# Patient Record
Sex: Male | Born: 1955 | Race: White | Hispanic: No | Smoking: Current every day smoker
Health system: Southern US, Community
[De-identification: ages and names within clinical notes are randomized; demographics above are authoritative.]

## PROBLEM LIST (undated history)

## (undated) DIAGNOSIS — I714 Abdominal aortic aneurysm, without rupture, unspecified: Secondary | ICD-10-CM

## (undated) DIAGNOSIS — K219 Gastro-esophageal reflux disease without esophagitis: Secondary | ICD-10-CM

## (undated) DIAGNOSIS — C099 Malignant neoplasm of tonsil, unspecified: Secondary | ICD-10-CM

## (undated) DIAGNOSIS — J449 Chronic obstructive pulmonary disease, unspecified: Secondary | ICD-10-CM

## (undated) DIAGNOSIS — I10 Essential (primary) hypertension: Secondary | ICD-10-CM

## (undated) DIAGNOSIS — J45909 Unspecified asthma, uncomplicated: Secondary | ICD-10-CM

## (undated) DIAGNOSIS — I619 Nontraumatic intracerebral hemorrhage, unspecified: Secondary | ICD-10-CM

## (undated) DIAGNOSIS — M1990 Unspecified osteoarthritis, unspecified site: Secondary | ICD-10-CM

## (undated) DIAGNOSIS — K5792 Diverticulitis of intestine, part unspecified, without perforation or abscess without bleeding: Secondary | ICD-10-CM

## (undated) DIAGNOSIS — I2510 Atherosclerotic heart disease of native coronary artery without angina pectoris: Secondary | ICD-10-CM

## (undated) DIAGNOSIS — I700 Atherosclerosis of aorta: Secondary | ICD-10-CM

## (undated) HISTORY — DX: Nontraumatic intracerebral hemorrhage, unspecified: I61.9

## (undated) HISTORY — PX: APPENDECTOMY: SHX54

## (undated) HISTORY — PX: HERNIA REPAIR: SHX51

## (undated) HISTORY — PX: FRACTURE SURGERY: SHX138

## (undated) HISTORY — DX: Chronic obstructive pulmonary disease, unspecified: J44.9

## (undated) HISTORY — DX: Malignant neoplasm of tonsil, unspecified: C09.9

---

## 2006-09-15 ENCOUNTER — Ambulatory Visit: Payer: Self-pay | Admitting: Internal Medicine

## 2012-10-12 ENCOUNTER — Ambulatory Visit: Payer: Self-pay | Admitting: Family Medicine

## 2012-11-09 ENCOUNTER — Emergency Department: Payer: Self-pay | Admitting: Unknown Physician Specialty

## 2012-11-09 LAB — CBC
MCV: 100 fL (ref 80–100)
Platelet: 250 10*3/uL (ref 150–440)
RBC: 4.45 10*6/uL (ref 4.40–5.90)
RDW: 13.1 % (ref 11.5–14.5)

## 2012-11-09 LAB — COMPREHENSIVE METABOLIC PANEL
Alkaline Phosphatase: 82 U/L (ref 50–136)
Anion Gap: 8 (ref 7–16)
BUN: 16 mg/dL (ref 7–18)
Bilirubin,Total: 0.5 mg/dL (ref 0.2–1.0)
Calcium, Total: 9.1 mg/dL (ref 8.5–10.1)
Co2: 25 mmol/L (ref 21–32)
EGFR (African American): >60
EGFR (Non-African Amer.): >60
SGOT(AST): 72 U/L — ABNORMAL HIGH (ref 15–37)
SGPT (ALT): 115 U/L — ABNORMAL HIGH (ref 12–78)

## 2012-11-09 LAB — URINALYSIS, COMPLETE
Bilirubin,UR: NEGATIVE
Blood: NEGATIVE
Leukocyte Esterase: NEGATIVE
Nitrite: NEGATIVE
Ph: 5 (ref 4.5–8.0)
Protein: NEGATIVE
RBC,UR: 1 /HPF (ref 0–5)
Specific Gravity: 1.023 (ref 1.003–1.030)
Squamous Epithelial: NONE SEEN
WBC UR: <1 /HPF (ref 0–5)

## 2012-11-10 LAB — DRUG SCREEN, URINE
Barbiturates, Ur Screen: NEGATIVE (ref ?–200)
Benzodiazepine, Ur Scrn: NEGATIVE (ref ?–200)
Cocaine Metabolite,Ur ~~LOC~~: NEGATIVE (ref ?–300)
MDMA (Ecstasy)Ur Screen: NEGATIVE (ref ?–500)
Methadone, Ur Screen: NEGATIVE (ref ?–300)
Phencyclidine (PCP) Ur S: NEGATIVE (ref ?–25)
Tricyclic, Ur Screen: NEGATIVE (ref ?–1000)

## 2013-01-11 ENCOUNTER — Ambulatory Visit: Payer: Self-pay | Admitting: Family Medicine

## 2013-06-21 ENCOUNTER — Ambulatory Visit: Payer: Self-pay

## 2014-01-11 ENCOUNTER — Emergency Department: Payer: Self-pay | Admitting: Emergency Medicine

## 2015-09-25 ENCOUNTER — Other Ambulatory Visit: Payer: Self-pay | Admitting: Internal Medicine

## 2015-09-25 DIAGNOSIS — R1084 Generalized abdominal pain: Secondary | ICD-10-CM

## 2015-09-27 ENCOUNTER — Ambulatory Visit
Admission: RE | Admit: 2015-09-27 | Discharge: 2015-09-27 | Disposition: A | Discharge status: Home / Self Care | Payer: 59 | Source: Ambulatory Visit | Attending: Internal Medicine | Admitting: Internal Medicine

## 2015-09-27 ENCOUNTER — Other Ambulatory Visit: Payer: Self-pay | Admitting: Internal Medicine

## 2015-09-27 DIAGNOSIS — N200 Calculus of kidney: Secondary | ICD-10-CM | POA: Diagnosis not present

## 2015-09-27 DIAGNOSIS — R0602 Shortness of breath: Secondary | ICD-10-CM | POA: Insufficient documentation

## 2015-09-27 DIAGNOSIS — J449 Chronic obstructive pulmonary disease, unspecified: Secondary | ICD-10-CM

## 2015-09-27 DIAGNOSIS — R1084 Generalized abdominal pain: Secondary | ICD-10-CM

## 2015-11-13 ENCOUNTER — Other Ambulatory Visit: Payer: Self-pay | Admitting: Internal Medicine

## 2015-11-13 DIAGNOSIS — F17200 Nicotine dependence, unspecified, uncomplicated: Secondary | ICD-10-CM

## 2015-12-03 ENCOUNTER — Ambulatory Visit: Payer: 59

## 2015-12-03 ENCOUNTER — Telehealth: Payer: Self-pay | Admitting: *Deleted

## 2015-12-03 NOTE — Telephone Encounter (Signed)
Received referral for low dose lung cancer screening CT scan. Voicemail left at phone number listed in EMR for patient to call me back to facilitate scheduling scan.  

## 2015-12-10 ENCOUNTER — Telehealth: Payer: Self-pay | Admitting: *Deleted

## 2015-12-10 NOTE — Telephone Encounter (Signed)
Received referral for initial lung cancer screening scan. Contacted patient and obtained smoking history as well as answering questions related to screening process. Patient denies signs of lung cancer such as weight loss or hemoptysis. Patient denies comorbidity that would prevent curative treatment if lung cancer were found. Patient is tentatively scheduled for shared decision making visit and CT scan on 12/14/15 at 2pm, pending insurance approval from business office.

## 2015-12-10 NOTE — Telephone Encounter (Signed)
Received referral for low dose lung cancer screening CT scan. Voicemail left at phone number listed in EMR for patient to call me back to facilitate scheduling scan.  

## 2015-12-13 ENCOUNTER — Other Ambulatory Visit: Payer: Self-pay | Admitting: Family Medicine

## 2015-12-14 ENCOUNTER — Ambulatory Visit
Admission: RE | Admit: 2015-12-14 | Discharge: 2015-12-14 | Disposition: A | Discharge status: Home / Self Care | Payer: 59 | Source: Ambulatory Visit | Attending: Internal Medicine | Admitting: Internal Medicine

## 2015-12-14 ENCOUNTER — Inpatient Hospital Stay: Payer: 59 | Attending: Family Medicine | Admitting: Family Medicine

## 2015-12-14 ENCOUNTER — Encounter: Payer: Self-pay | Admitting: Family Medicine

## 2015-12-14 DIAGNOSIS — J439 Emphysema, unspecified: Secondary | ICD-10-CM | POA: Diagnosis not present

## 2015-12-14 DIAGNOSIS — Z122 Encounter for screening for malignant neoplasm of respiratory organs: Secondary | ICD-10-CM | POA: Insufficient documentation

## 2015-12-14 DIAGNOSIS — Z87891 Personal history of nicotine dependence: Secondary | ICD-10-CM | POA: Diagnosis not present

## 2015-12-14 DIAGNOSIS — I2510 Atherosclerotic heart disease of native coronary artery without angina pectoris: Secondary | ICD-10-CM | POA: Insufficient documentation

## 2015-12-14 DIAGNOSIS — I700 Atherosclerosis of aorta: Secondary | ICD-10-CM | POA: Diagnosis not present

## 2015-12-14 DIAGNOSIS — F17210 Nicotine dependence, cigarettes, uncomplicated: Secondary | ICD-10-CM | POA: Insufficient documentation

## 2015-12-14 DIAGNOSIS — F17200 Nicotine dependence, unspecified, uncomplicated: Secondary | ICD-10-CM

## 2015-12-14 DIAGNOSIS — M47894 Other spondylosis, thoracic region: Secondary | ICD-10-CM | POA: Insufficient documentation

## 2015-12-14 NOTE — Progress Notes (Signed)
In accordance with CMS guidelines, patient has meet eligibility criteria including age, absence of signs or symptoms of lung cancer, the specific calculation of cigarette smoking pack-years was 45 years and is a current smoker.   A shared decision-making session was conducted prior to the performance of CT scan. This includes one or more decision aids, includes benefits and harms of screening, follow-up diagnostic testing, over-diagnosis, false positive rate, and total radiation exposure.  Counseling on the importance of adherence to annual lung cancer LDCT screening, impact of co-morbidities, and ability or willingness to undergo diagnosis and treatment is imperative for compliance of the program.  Counseling on the importance of continued smoking cessation for former smokers; the importance of smoking cessation for current smokers and information about tobacco cessation interventions have been given to patient including the [place] at [facility], 1800 quit [place], as well as [place] specific smoking cessation programs.  Written order for lung cancer screening with LDCT has been given to the patient and any and all questions have been answered to the best of my abilities.   Yearly follow up will be scheduled by Burgess Estelle, Thoracic Navigator.

## 2015-12-17 ENCOUNTER — Telehealth: Payer: Self-pay | Admitting: *Deleted

## 2015-12-17 NOTE — Telephone Encounter (Signed)
Notified patient of LDCT lung cancer screening results with recommendation for 12 month follow up imaging. Also notified of incidental finding noted below. Patient verbalizes understanding.   IMPRESSION: 1. Lung-RADS Category 2, benign appearance or behavior. Continue annual screening with low-dose chest CT without contrast in 12 months 2. Emphysema 3. Aortic atherosclerosis and coronary artery calcification. 4. Thoracic spondylosis and kyphosis

## 2016-10-28 ENCOUNTER — Encounter: Payer: Self-pay | Admitting: Emergency Medicine

## 2016-10-28 DIAGNOSIS — I10 Essential (primary) hypertension: Secondary | ICD-10-CM | POA: Insufficient documentation

## 2016-10-28 DIAGNOSIS — J45909 Unspecified asthma, uncomplicated: Secondary | ICD-10-CM | POA: Insufficient documentation

## 2016-10-28 DIAGNOSIS — K5732 Diverticulitis of large intestine without perforation or abscess without bleeding: Secondary | ICD-10-CM | POA: Insufficient documentation

## 2016-10-28 DIAGNOSIS — F17210 Nicotine dependence, cigarettes, uncomplicated: Secondary | ICD-10-CM | POA: Insufficient documentation

## 2016-10-28 DIAGNOSIS — K852 Alcohol induced acute pancreatitis without necrosis or infection: Secondary | ICD-10-CM | POA: Insufficient documentation

## 2016-10-28 DIAGNOSIS — R1013 Epigastric pain: Secondary | ICD-10-CM | POA: Diagnosis present

## 2016-10-28 LAB — CBC
HEMATOCRIT: 45.4 % (ref 40.0–52.0)
HEMOGLOBIN: 16 g/dL (ref 13.0–18.0)
MCH: 35.1 pg — ABNORMAL HIGH (ref 26.0–34.0)
MCHC: 35.3 g/dL (ref 32.0–36.0)
MCV: 99.3 fL (ref 80.0–100.0)
Platelets: 351 10*3/uL (ref 150–440)
RBC: 4.58 MIL/uL (ref 4.40–5.90)
RDW: 13.1 % (ref 11.5–14.5)
WBC: 15.6 10*3/uL — AB (ref 3.8–10.6)

## 2016-10-28 LAB — COMPREHENSIVE METABOLIC PANEL
ALBUMIN: 4.3 g/dL (ref 3.5–5.0)
ALT: 92 U/L — ABNORMAL HIGH (ref 17–63)
ANION GAP: 8 (ref 5–15)
AST: 69 U/L — AB (ref 15–41)
Alkaline Phosphatase: 91 U/L (ref 38–126)
BILIRUBIN TOTAL: 0.7 mg/dL (ref 0.3–1.2)
BUN: 17 mg/dL (ref 6–20)
CHLORIDE: 101 mmol/L (ref 101–111)
CO2: 28 mmol/L (ref 22–32)
Calcium: 9.6 mg/dL (ref 8.9–10.3)
Creatinine, Ser: 0.99 mg/dL (ref 0.61–1.24)
GFR calc Af Amer: >60 mL/min (ref >60)
GFR calc non Af Amer: >60 mL/min (ref >60)
GLUCOSE: 180 mg/dL — AB (ref 65–99)
POTASSIUM: 3.9 mmol/L (ref 3.5–5.1)
Sodium: 137 mmol/L (ref 135–145)
TOTAL PROTEIN: 7.9 g/dL (ref 6.5–8.1)

## 2016-10-28 LAB — URINALYSIS, COMPLETE (UACMP) WITH MICROSCOPIC
BACTERIA UA: NONE SEEN
BILIRUBIN URINE: NEGATIVE
Glucose, UA: NEGATIVE mg/dL
HGB URINE DIPSTICK: NEGATIVE
KETONES UR: NEGATIVE mg/dL
LEUKOCYTES UA: NEGATIVE
NITRITE: NEGATIVE
PROTEIN: NEGATIVE mg/dL
SQUAMOUS EPITHELIAL / LPF: NONE SEEN
Specific Gravity, Urine: 1.021 (ref 1.005–1.030)
pH: 5 (ref 5.0–8.0)

## 2016-10-28 LAB — ETHANOL

## 2016-10-28 LAB — TROPONIN I

## 2016-10-28 LAB — LIPASE, BLOOD: LIPASE: 66 U/L — AB (ref 11–51)

## 2016-10-28 NOTE — ED Triage Notes (Signed)
Patient ambulatory to triage with steady gait, without difficulty or distress noted; pt reports mid abd pain x 3 days accomp by N/V; st hx hernia

## 2016-10-29 ENCOUNTER — Emergency Department: Payer: 59

## 2016-10-29 ENCOUNTER — Emergency Department
Admission: EM | Admit: 2016-10-29 | Discharge: 2016-10-29 | Disposition: A | Discharge status: Home / Self Care | Payer: 59 | Attending: Emergency Medicine | Admitting: Emergency Medicine

## 2016-10-29 DIAGNOSIS — K5732 Diverticulitis of large intestine without perforation or abscess without bleeding: Secondary | ICD-10-CM

## 2016-10-29 DIAGNOSIS — K852 Alcohol induced acute pancreatitis without necrosis or infection: Secondary | ICD-10-CM

## 2016-10-29 HISTORY — DX: Essential (primary) hypertension: I10

## 2016-10-29 HISTORY — DX: Unspecified asthma, uncomplicated: J45.909

## 2016-10-29 MED ORDER — CIPROFLOXACIN HCL 500 MG PO TABS: 500.0000 mg | ORAL_TABLET | Freq: Two times a day (BID) | ORAL | 0 refills | Status: AC

## 2016-10-29 MED ORDER — CIPROFLOXACIN HCL 500 MG PO TABS
500.0000 mg | ORAL_TABLET | Freq: Once | ORAL | Status: AC
  Administered 2016-10-29: 500 mg via ORAL
  Filled 2016-10-29: qty 1

## 2016-10-29 MED ORDER — ONDANSETRON HCL 4 MG/2ML IJ SOLN
4.0000 mg | Freq: Once | INTRAMUSCULAR | Status: AC
  Administered 2016-10-29: 4 mg via INTRAVENOUS
  Filled 2016-10-29: qty 2

## 2016-10-29 MED ORDER — MORPHINE SULFATE (PF) 4 MG/ML IV SOLN
4.0000 mg | Freq: Once | INTRAVENOUS | Status: AC
  Administered 2016-10-29: 4 mg via INTRAVENOUS
  Filled 2016-10-29: qty 1

## 2016-10-29 MED ORDER — MORPHINE SULFATE (PF) 4 MG/ML IV SOLN
INTRAVENOUS | Status: AC
  Filled 2016-10-29: qty 1

## 2016-10-29 MED ORDER — IOPAMIDOL (ISOVUE-300) INJECTION 61%
30.0000 mL | Freq: Once | INTRAVENOUS | Status: AC | PRN
  Administered 2016-10-29: 30 mL via ORAL

## 2016-10-29 MED ORDER — OXYCODONE-ACETAMINOPHEN 5-325 MG PO TABS: 1.0000 | ORAL_TABLET | ORAL | 0 refills | Status: DC | PRN

## 2016-10-29 MED ORDER — METRONIDAZOLE 500 MG PO TABS
500.0000 mg | ORAL_TABLET | Freq: Once | ORAL | Status: AC
  Administered 2016-10-29: 500 mg via ORAL
  Filled 2016-10-29: qty 1

## 2016-10-29 MED ORDER — MORPHINE SULFATE (PF) 4 MG/ML IV SOLN
8.0000 mg | Freq: Once | INTRAVENOUS | Status: AC
  Administered 2016-10-29: 8 mg via INTRAVENOUS

## 2016-10-29 MED ORDER — METRONIDAZOLE 500 MG PO TABS: 500.0000 mg | ORAL_TABLET | Freq: Two times a day (BID) | ORAL | 0 refills | Status: AC

## 2016-10-29 MED ORDER — SODIUM CHLORIDE 0.9 % IV BOLUS (SEPSIS)
1000.0000 mL | Freq: Once | INTRAVENOUS | Status: AC
  Administered 2016-10-29: 1000 mL via INTRAVENOUS

## 2016-10-29 MED ORDER — IOPAMIDOL (ISOVUE-300) INJECTION 61%
100.0000 mL | Freq: Once | INTRAVENOUS | Status: AC | PRN
Start: 2016-10-29 — End: 2016-10-29
  Administered 2016-10-29: 100 mL via INTRAVENOUS

## 2016-10-29 NOTE — ED Notes (Signed)
Pt finished drinking contrast. CT made aware.

## 2016-10-29 NOTE — ED Provider Notes (Signed)
[facility] Emergency Department Provider Note   First MD Initiated Contact with Patient 10/29/16 0101     (approximate)  I have reviewed the triage vital signs and the nursing notes.   HISTORY  Chief Complaint Abdominal Pain   HPI Ronnie Ingram is a 61 y.o. male with history of midline abdominal hernia presents to the emergency department with mid abdominal pain 3 days associated with nausea and vomiting. Patient denies any fever or febrile on presentation her temperature 97.8. Patient denies any urinary symptoms. Patient states last bowel movement 3 days ago. Patient does admit to EtOH ingestion with last occurrence yesterday. Patient states his current pain score is 9 out of 10.  Past medical history Midline abdominal hernia  There are no active problems to display for this patient.   Past surgical history None  Prior to Admission medications   Medication Sig Start Date End Date Taking? Authorizing Provider  ciprofloxacin (CIPRO) 500 MG tablet Take 1 tablet (500 mg total) by mouth 2 (two) times daily. 10/29/16 11/12/16  Gregor Hams, MD  metroNIDAZOLE (FLAGYL) 500 MG tablet Take 1 tablet (500 mg total) by mouth 2 (two) times daily. 10/29/16 11/12/16  Gregor Hams, MD  oxyCODONE-acetaminophen (ROXICET) 5-325 MG tablet Take 1 tablet by mouth every 4 (four) hours as needed for severe pain. 10/29/16   Gregor Hams, MD    Allergies No known drug allergies No family history on file.  Social History Social History  Substance Use Topics  . Smoking status: Current Every Day Smoker    Packs/day: 1.00    Years: 30.00    Types: Cigarettes  . Smokeless tobacco: Never Used  . Alcohol use 7.2 oz/week    12 Cans of beer per week    Review of Systems Constitutional: No fever/chills Eyes: No visual changes. ENT: No sore throat. Cardiovascular: Denies chest pain. Respiratory: Denies shortness of breath. Gastrointestinal: No abdominal pain.  No  nausea, no vomiting.  No diarrhea.  No constipation. Genitourinary: Negative for dysuria. Musculoskeletal: Negative for back pain. Integumentary: Negative for rash. Neurological: Negative for headaches, focal weakness or numbness.   ____________________________________________   PHYSICAL EXAM:  VITAL SIGNS: ED Triage Vitals  Enc Vitals Group     BP 10/28/16 2115 (!) 154/87     Pulse Rate 10/28/16 2115 83     Resp 10/28/16 2115 18     Temp 10/28/16 2115 97.8 F (36.6 C)     Temp Source 10/28/16 2115 Oral     SpO2 10/28/16 2115 97 %     Weight 10/28/16 2112 185 lb (83.9 kg)     Height 10/28/16 2112 5\' 10"  (1.778 m)     Head Circumference --      Peak Flow --      Pain Score 10/28/16 2112 9     Pain Loc --      Pain Edu? --      Excl. in [place]? --     Constitutional: Alert and oriented. Apparent discomfort  Eyes: Conjunctivae are normal. PERRL. EOMI. Head: Atraumatic.Marland Kitchen Mouth/Throat: Mucous membranes are moist.  Oropharynx non-erythematous. Neck: No stridor.   Cardiovascular: Normal rate, regular rhythm. Good peripheral circulation. Grossly normal heart sounds. Respiratory: Normal respiratory effort.  No retractions. Lungs CTAB. Gastrointestinal: Epigastric and left lower quadrant abdominal pain No distention.   Musculoskeletal: No lower extremity tenderness nor edema. No gross deformities of extremities. Neurologic:  Normal speech and language. No gross focal neurologic deficits are  appreciated.  Skin:  Skin is warm, dry and intact. No rash noted. Psychiatric: Mood and affect are normal. Speech and behavior are normal.  ____________________________________________   LABS (all labs ordered are listed, but only abnormal results are displayed)  Labs Reviewed  LIPASE, BLOOD - Abnormal; Notable for the following:       Result Value   Lipase 66 (*)    All other components within normal limits  COMPREHENSIVE METABOLIC PANEL - Abnormal; Notable for the following:    Glucose,  Bld 180 (*)    AST 69 (*)    ALT 92 (*)    All other components within normal limits  CBC - Abnormal; Notable for the following:    WBC 15.6 (*)    MCH 35.1 (*)    All other components within normal limits  URINALYSIS, COMPLETE (UACMP) WITH MICROSCOPIC - Abnormal; Notable for the following:    Color, Urine YELLOW (*)    APPearance CLEAR (*)    All other components within normal limits  TROPONIN I  ETHANOL   ____________________________________________  EKG  ED ECG REPORT I, [place] N BROWN, the attending physician, personally viewed and interpreted this ECG.   Date: 10/29/2016  EKG Time: 9:18 PM  Rate: 83  Rhythm: Normal sinus rhythm  Axis: Normal  Intervals: Normal  ST&T Change: None  ____________________________________________  RADIOLOGY I, [place] N BROWN, personally viewed and evaluated these images (plain radiographs) as part of my medical decision making, as well as reviewing the written report by the radiologist. Dg Abdomen 1 View  Result Date: 10/29/2016 CLINICAL DATA:  61 y/o M; acute abdominal pain. Concern for perforation. EXAM: ABDOMEN - 1 VIEW COMPARISON:  10/29/2016 CT of the abdomen and pelvis. FINDINGS: Normal bowel gas pattern. Retained oral contrast within the small bowel and excreted intravenous contrast in the renal collecting system. No pneumoperitoneum identified. Mild degenerative changes of the lumbar spine. IMPRESSION: No pneumoperitoneum identified. Electronically Signed   By: Kristine Garbe M.D.   On: 10/29/2016 03:13   Ct Abdomen Pelvis W Contrast  Result Date: 10/29/2016 CLINICAL DATA:  Abdominal pain for 3 days with nausea and vomiting. Left lower quadrant and epigastric pain. White cell count 15.6. EXAM: CT ABDOMEN AND PELVIS WITH CONTRAST TECHNIQUE: Multidetector CT imaging of the abdomen and pelvis was performed using the standard protocol following bolus administration of intravenous contrast. CONTRAST:  151mL ISOVUE-300 IOPAMIDOL  (ISOVUE-300) INJECTION 61% COMPARISON:  None. FINDINGS: Lower chest: The lung bases are clear. Hepatobiliary: No focal liver abnormality is seen. No gallstones, gallbladder wall thickening, or biliary dilatation. Pancreas: Unremarkable. No pancreatic ductal dilatation or surrounding inflammatory changes. Spleen: Normal in size without focal abnormality. Adrenals/Urinary Tract: Adrenal glands are unremarkable. Kidneys are normal, without renal calculi, focal lesion, or hydronephrosis. Bladder is unremarkable. Stomach/Bowel: Stomach, small bowel, and colon are not abnormally distended. No wall thickening or inflammatory changes identified. Diverticulosis of the sigmoid colon. Focal wall thickening demonstrated at the rectosigmoid junction with mild infiltration in the surrounding fat may indicate early diverticulitis. Follow-up after resolution of acute process is recommended to exclude underlying neoplasm. No abscess. Appendix is surgically absent. Vascular/Lymphatic: Calcific aortic atherosclerosis. Lower abdominal aortic dilatation with maximal diameter of 3.2 cm. No dissection. No significant lymphadenopathy. Reproductive: Prostate calcifications. Prostate gland is not enlarged. Other: No free air or free fluid in the abdomen. Abdominal wall musculature appears intact. Musculoskeletal: Degenerative changes in the spine and hips. No destructive bone lesions. IMPRESSION: Diverticulosis of the sigmoid colon with minimal infiltration  at the rectosigmoid junction suggesting mild diverticulitis. No abscess. Wall thickening of the sigmoid colon at the rectosigmoid junction is likely inflammatory but follow-up after resolution of acute process is recommended to exclude underlying colon neoplasm. Electronically Signed   By: Lucienne Capers M.D.   On: 10/29/2016 02:43     Procedures   ____________________________________________   INITIAL IMPRESSION / ASSESSMENT AND PLAN / ED COURSE  Pertinent labs & imaging  results that were available during my care of the patient were reviewed by me and considered in my medical decision making (see chart for details).  61 year old male presenting with epigastric and left lower quadrant abdominal pain associated with vomiting. History of physical exam concerning for possible pancreatitis and diverticulitis which are confirmed by laboratory data which revealed an elevated lipase. CT scan revealed diverticulitis. Patient given Cipro and Flagyl in the emergency department will be prescribed same for home.  While in the emergency room the patient received IV morphine. In addition the patient was given ciprofloxacin and Flagyl. Patient advised not to drink alcohol as this will exacerbate his pancreatitis. Patient is advised to return to emergency department if worsening pain vomiting or fever.    ____________________________________________  FINAL CLINICAL IMPRESSION(S) / ED DIAGNOSES  Final diagnoses:  Alcohol-induced acute pancreatitis without infection or necrosis  Diverticulitis of large intestine without perforation or abscess without bleeding     MEDICATIONS GIVEN DURING THIS VISIT:  Medications  morphine 4 MG/ML injection (not administered)  morphine 4 MG/ML injection 4 mg (4 mg Intravenous Given 10/29/16 0116)  ondansetron (ZOFRAN) injection 4 mg (4 mg Intravenous Given 10/29/16 0116)  iopamidol (ISOVUE-300) 61 % injection 30 mL (30 mLs Oral Contrast Given 10/29/16 0134)  iopamidol (ISOVUE-300) 61 % injection 100 mL (100 mLs Intravenous Contrast Given 10/29/16 0222)  morphine 4 MG/ML injection 8 mg (8 mg Intravenous Given 10/29/16 0258)  sodium chloride 0.9 % bolus 1,000 mL (0 mLs Intravenous Stopped 10/29/16 0350)     NEW OUTPATIENT MEDICATIONS STARTED DURING THIS VISIT:  New Prescriptions   CIPROFLOXACIN (CIPRO) 500 MG TABLET    Take 1 tablet (500 mg total) by mouth 2 (two) times daily.   METRONIDAZOLE (FLAGYL) 500 MG TABLET    Take 1 tablet (500 mg total)  by mouth 2 (two) times daily.   OXYCODONE-ACETAMINOPHEN (ROXICET) 5-325 MG TABLET    Take 1 tablet by mouth every 4 (four) hours as needed for severe pain.    Modified Medications   No medications on file    Discontinued Medications   No medications on file     Note:  This document was prepared using Dragon voice recognition software and may include unintentional dictation errors.    Gregor Hams, MD 10/29/16 [phone]

## 2016-12-09 ENCOUNTER — Telehealth: Payer: Self-pay | Admitting: *Deleted

## 2016-12-09 NOTE — Telephone Encounter (Signed)
Attempted to leave message for patient to notify them that it is time to schedule annual low dose lung cancer screening CT scan. However, there is no message option on number in emr. Will attempt to contact at later date.

## 2017-01-08 ENCOUNTER — Encounter: Payer: Self-pay | Admitting: *Deleted

## 2017-11-24 ENCOUNTER — Other Ambulatory Visit: Payer: Self-pay | Admitting: Physician Assistant

## 2017-11-24 DIAGNOSIS — J449 Chronic obstructive pulmonary disease, unspecified: Secondary | ICD-10-CM

## 2017-11-26 ENCOUNTER — Telehealth: Payer: Self-pay | Admitting: *Deleted

## 2017-11-26 NOTE — Telephone Encounter (Signed)
Received referral for initial lung cancer screening scan. Noted patient was previously active in the lung screening program but had failed to have the recommended annual lung screening scan. Discussed importance of this screening with patient who requested to have his wife call me to discuss further. Verified that he had my call back number and that he would have his wife call me.

## 2017-11-30 ENCOUNTER — Other Ambulatory Visit: Payer: Self-pay

## 2017-11-30 DIAGNOSIS — Z1211 Encounter for screening for malignant neoplasm of colon: Secondary | ICD-10-CM

## 2017-12-01 ENCOUNTER — Ambulatory Visit: Payer: Self-pay

## 2017-12-03 ENCOUNTER — Other Ambulatory Visit: Payer: Self-pay

## 2017-12-07 ENCOUNTER — Other Ambulatory Visit: Payer: Self-pay

## 2017-12-07 DIAGNOSIS — Z1211 Encounter for screening for malignant neoplasm of colon: Secondary | ICD-10-CM

## 2017-12-07 DIAGNOSIS — F17200 Nicotine dependence, unspecified, uncomplicated: Secondary | ICD-10-CM | POA: Insufficient documentation

## 2017-12-07 MED ORDER — PEG 3350-KCL-NA BICARB-NACL 420 G PO SOLR: 4000.0000 mL | Freq: Once | ORAL | 0 refills | Status: AC

## 2017-12-07 NOTE — Telephone Encounter (Signed)
Ms. Ronnie Ingram called ENDO stating she was unaware of the procedure and didn't receive any documentation.   I called and spoke to Ronnie Ingram. Changed the procedure date based on Ronnie Ingram availability. Emailed instructions. Sent prep to the pharmacy of her choice.

## 2017-12-08 ENCOUNTER — Encounter: Payer: Self-pay | Admitting: *Deleted

## 2017-12-09 ENCOUNTER — Ambulatory Visit: Payer: Managed Care, Other (non HMO) | Admitting: Anesthesiology

## 2017-12-09 ENCOUNTER — Ambulatory Visit
Admission: RE | Admit: 2017-12-09 | Discharge: 2017-12-09 | Disposition: A | Discharge status: Home / Self Care | Payer: Managed Care, Other (non HMO) | Source: Ambulatory Visit | Attending: Gastroenterology | Admitting: Gastroenterology

## 2017-12-09 ENCOUNTER — Other Ambulatory Visit: Payer: Self-pay

## 2017-12-09 ENCOUNTER — Encounter
Admission: RE | Disposition: A | Discharge status: Home / Self Care | Payer: Self-pay | Source: Ambulatory Visit | Attending: Gastroenterology

## 2017-12-09 ENCOUNTER — Encounter: Payer: Self-pay | Admitting: Anesthesiology

## 2017-12-09 DIAGNOSIS — Z1211 Encounter for screening for malignant neoplasm of colon: Secondary | ICD-10-CM | POA: Insufficient documentation

## 2017-12-09 DIAGNOSIS — J45909 Unspecified asthma, uncomplicated: Secondary | ICD-10-CM | POA: Insufficient documentation

## 2017-12-09 DIAGNOSIS — Z538 Procedure and treatment not carried out for other reasons: Secondary | ICD-10-CM | POA: Diagnosis not present

## 2017-12-09 DIAGNOSIS — F17200 Nicotine dependence, unspecified, uncomplicated: Secondary | ICD-10-CM | POA: Diagnosis not present

## 2017-12-09 DIAGNOSIS — I10 Essential (primary) hypertension: Secondary | ICD-10-CM | POA: Insufficient documentation

## 2017-12-09 DIAGNOSIS — Z79899 Other long term (current) drug therapy: Secondary | ICD-10-CM | POA: Insufficient documentation

## 2017-12-09 SURGERY — COLONOSCOPY WITH PROPOFOL
Anesthesia: General

## 2017-12-09 MED ORDER — SODIUM CHLORIDE 0.9 % IV SOLN: INTRAVENOUS | Status: DC

## 2017-12-09 NOTE — Progress Notes (Signed)
Procedure not done due to poor pre

## 2017-12-09 NOTE — Anesthesia Preprocedure Evaluation (Signed)
Anesthesia Evaluation  Patient identified by MRN, date of birth, ID band Patient awake    Reviewed: Allergy & Precautions, NPO status , Patient's Chart, lab work & pertinent test results  Airway Mallampati: II  TM Distance: >3 FB     Dental   Pulmonary asthma , Current Smoker,    Pulmonary exam normal        Cardiovascular hypertension, Normal cardiovascular exam     Neuro/Psych negative neurological ROS  negative psych ROS   GI/Hepatic Neg liver ROS,   Endo/Other  negative endocrine ROS  Renal/GU negative Renal ROS  negative genitourinary   Musculoskeletal negative musculoskeletal ROS (+)   Abdominal Normal abdominal exam  (+)   Peds negative pediatric ROS (+)  Hematology negative hematology ROS (+)   Anesthesia Other Findings   Reproductive/Obstetrics                             Anesthesia Physical Anesthesia Plan  ASA: II  Anesthesia Plan: General   Post-op Pain Management:    Induction: Intravenous  PONV Risk Score and Plan:   Airway Management Planned: Nasal Cannula  Additional Equipment:   Intra-op Plan:   Post-operative Plan:   Informed Consent: I have reviewed the patients History and Physical, chart, labs and discussed the procedure including the risks, benefits and alternatives for the proposed anesthesia with the patient or authorized representative who has indicated his/her understanding and acceptance.   Dental advisory given  Plan Discussed with: CRNA and Surgeon  Anesthesia Plan Comments:         Anesthesia Quick Evaluation

## 2017-12-14 ENCOUNTER — Telehealth: Payer: Self-pay | Admitting: Nurse Practitioner

## 2017-12-15 ENCOUNTER — Telehealth: Payer: Self-pay | Admitting: *Deleted

## 2017-12-15 ENCOUNTER — Ambulatory Visit: Payer: Managed Care, Other (non HMO)

## 2017-12-15 DIAGNOSIS — Z122 Encounter for screening for malignant neoplasm of respiratory organs: Secondary | ICD-10-CM

## 2017-12-15 DIAGNOSIS — Z87891 Personal history of nicotine dependence: Secondary | ICD-10-CM

## 2017-12-15 NOTE — Telephone Encounter (Signed)
Notified patient that annual lung cancer screening low dose CT scan is due currently or will be in near future. Confirmed that patient is within the age range of 55-77, and asymptomatic, (no signs or symptoms of lung cancer). Patient denies illness that would prevent curative treatment for lung cancer if found. Verified smoking history, (current, 46 pack year). The shared decision making visit was done 12/14/15. Patient is agreeable for CT scan being scheduled.

## 2017-12-15 NOTE — Telephone Encounter (Signed)
error 

## 2017-12-22 ENCOUNTER — Ambulatory Visit: Admission: RE | Admit: 2017-12-22 | Payer: Managed Care, Other (non HMO) | Source: Ambulatory Visit

## 2017-12-31 ENCOUNTER — Ambulatory Visit
Admission: RE | Admit: 2017-12-31 | Payer: Managed Care, Other (non HMO) | Source: Ambulatory Visit | Admitting: Gastroenterology

## 2017-12-31 ENCOUNTER — Encounter: Admission: RE | Payer: Self-pay | Source: Ambulatory Visit

## 2017-12-31 SURGERY — COLONOSCOPY WITH PROPOFOL
Anesthesia: General

## 2018-01-01 ENCOUNTER — Encounter: Payer: Self-pay | Admitting: *Deleted

## 2018-08-26 ENCOUNTER — Ambulatory Visit: Payer: Self-pay | Admitting: General Surgery

## 2018-08-26 NOTE — H&P (View-Only) (Signed)
HISTORY OF PRESENT ILLNESS:    Ronnie Ingram is a 63 y.o.male patient who comes for follow up right inguinal hernia.    RonnieFordreportshaving an inguinal hernia since 2 months ago.  He was previously evaluated for this problem while he was having cough and a COPD exacerbation and he was sent to the pulmonologist for further evaluation and treatment.  He saw the pulmonologist which said that he was stable on his COPD and cleared him for surgery.    The patient started feeling the hernia due to coughing. He feels like a ball on the right inguinal area. He feels pain when the hernia is out. Pain improves when the hernia is reduced. Pain does not radiates. Patient denies episodes of abdominal distention, nausea and vomiting.  Patient refers history of umbilical and left inguinal hernia repair.   PAST MEDICAL HISTORY:      Past Medical History:  Diagnosis Date  . COPD (chronic obstructive pulmonary disease) (CMS-HCC)   . Diverticulitis   . Emphysema lung (CMS-HCC)   . Hypertension   . Insomnia         PAST SURGICAL HISTORY:        Past Surgical History:  Procedure Laterality Date  . UMBILICAL HERNIA REPAIR     around 1990         MEDICATIONS:  EncounterMedications        Outpatient Encounter Medications as of 08/26/2018  Medication Sig Dispense Refill  . gabapentin (NEURONTIN) 300 MG capsule Take 300 mg by mouth 2 (two) times daily    . lisinopril (ZESTRIL) 40 MG tablet Take 40 mg by mouth once daily    . albuterol sulfate (PROAIR DIGIHALER) 90 mcg/actuation aebs Inhale 180 mcg into the lungs every 4 (four) hours    . ipratropium-albuterol (COMBIVENT RESPIMAT) 20-100 mcg/actuation inhaler Inhale 2 inhalations into the lungs 2 (two) times daily (Patient not taking: Reported on 08/26/2018  ) 1 Inhaler 11  . nicotine (NICOTROL) 10 mg cartridge for inhalation Inhale 1 inhalation into the lungs as needed for up to 90 days (Patient not taking: Reported on 08/26/2018  ) 168 each  0  . nicotine polacrilex (NICORETTE) 4 MG lozenge Place 1 lozenge (4 mg total) inside cheek as needed for Smoking cessation for up to 90 days (Patient not taking: Reported on 08/26/2018  ) 108 lozenge 0   No facility-administered encounter medications on file as of 08/26/2018.        ALLERGIES:   Patient has no known allergies.   SOCIAL HISTORY:  Social History          Socioeconomic History  . Marital status: Life Partner    Spouse name: Not on file  . Number of children: Not on file  . Years of education: Not on file  . Highest education level: Not on file  Occupational History  . Not on file  Social Needs  . Financial resource strain: Not on file  . Food insecurity:    Worry: Not on file    Inability: Not on file  . Transportation needs:    Medical: Not on file    Non-medical: Not on file  Tobacco Use  . Smoking status: Current Every Day Smoker    Packs/day: 1.00    Years: 40.00    Pack years: 40.00  . Smokeless tobacco: Never Used  Substance and Sexual Activity  . Alcohol use: Yes    Alcohol/week: 24.0 standard drinks    Types: 24 Cans of beer per  week  . Drug use: Yes    Comment: marijauna  . Sexual activity: Not on file  Other Topics Concern  . Not on file  Social History Narrative  . Not on file      FAMILY HISTORY:       Family History  Problem Relation Age of Onset  . Lung cancer Mother   . Stroke Father   . Aneurysm Father        stomach  . Emphysema Paternal Grandfather      GENERAL REVIEW OF SYSTEMS:   General ROS: negative for - chills, fatigue, fever, weight gain or weight loss Allergy and Immunology ROS: negative for - hives  Hematological and Lymphatic ROS: negative for - bleeding problems or bruising, negative for palpable nodes Endocrine ROS: negative for - heat or cold intolerance, hair changes Respiratory ROS: negative for - cough, shortness of breath or wheezing Cardiovascular ROS: no chest pain or  palpitations GI ROS: negative for nausea, vomiting, abdominal pain, diarrhea, constipation Musculoskeletal ROS: negative for - joint swelling or muscle pain Neurological ROS: negative for - confusion, syncope Dermatological ROS: negative for pruritus and rash  PHYSICAL EXAM:     Vitals:   08/26/18 1144  BP: 128/78  Pulse: 68  Temp: 36.6 C (97.8 F)   Ht:177.8 cm (5\' 10" ) Wt:80.7 kg (178 lb) LGX:QJJH surface area is 2 meters squared. Body mass index is 25.54 kg/m.Marland Kitchen   GENERAL: Alert, active, oriented x3  HEENT: Pupils equal reactive to light. Extraocular movements are intact. Sclera clear. Palpebral conjunctiva normal red color.  NECK: Supple with no palpable mass and no adenopathy.  LUNGS: Bilateral rhonchus without wheezing.  HEART: Regular rhythm S1 and S2 without murmur.  ABDOMEN: Soft and depressible, nontender with no palpable mass, no hepatomegaly.  Right inguinal hernia palpable on Valsalva.  Soft and reducible.  EXTREMITIES: Well-developed well-nourished symmetrical with no dependent edema.  NEUROLOGICAL: Awake alert oriented, facial expression symmetrical, moving all extremities.      IMPRESSION:     Non-recurrent unilateral inguinal hernia without obstruction or gangrene [K40.90]  The patient presents with a symptomatic, reduciblerightinguinal hernia.  Again it was discussed about the diagnosis of inguinal hernia and its implication. The patient was oriented about the treatment alternatives (observation vs surgical repair). Due to patient symptoms, repair is recommended. Patient oriented about the surgical procedure, the use of mesh and its risk of complications such as: infection, bleeding, injury to vas deference, vasculature and testicle, injury to bowel or bladder, and chronic pain.  Patient was evaluated by pulmonologist but assessed that he is at low to moderate risk of pulmonary complications.  He recommended to perform incentive spirometer  after surgery.  Patient was oriented again about the risk of surgery due to his lung disease and the risk of complications.  He was also oriented again about the risk of recurrence due to coughing and due to smoking.  Patient reported that at this moment he is still symptomatic and he will be difficult for him to wait until he stop smoking.  He has decreased smoking from more than a pack of cigarettes to less than a pack of cigarettes per day.  He reports that he is willing to take the brace now and improve his quality of life with the repair of the hernia.   PLAN:  1. Right Inguinal hernia with mesh (41740) 2. CBC, CMP 3. Pulmonologist clearance (done) 4. Avoid Asprin 5 days before surgery.  5. Contact us if has any  question or concern.   Patient verbalized understanding, all questions were answered, and were agreeable with the plan outlined above.   Herbert Pun, MD  Electronically signed by Herbert Pun, MD

## 2018-08-26 NOTE — H&P (Signed)
HISTORY OF PRESENT ILLNESS:    [name] is a 63 y.o.male patient who comes for follow up right inguinal hernia.    Mr.Fordreportshaving an inguinal hernia since 2 months ago.  He was previously evaluated for this problem while he was having cough and a COPD exacerbation and he was sent to the pulmonologist for further evaluation and treatment.  He saw the pulmonologist which said that he was stable on his COPD and cleared him for surgery.    The patient started feeling the hernia due to coughing. He feels like a ball on the right inguinal area. He feels pain when the hernia is out. Pain improves when the hernia is reduced. Pain does not radiates. Patient denies episodes of abdominal distention, nausea and vomiting.  Patient refers history of umbilical and left inguinal hernia repair.   PAST MEDICAL HISTORY:      Past Medical History:  Diagnosis Date  . COPD (chronic obstructive pulmonary disease) (CMS-HCC)   . Diverticulitis   . Emphysema lung (CMS-HCC)   . Hypertension   . Insomnia         PAST SURGICAL HISTORY:        Past Surgical History:  Procedure Laterality Date  . UMBILICAL HERNIA REPAIR     around 1990         MEDICATIONS:  EncounterMedications        Outpatient Encounter Medications as of 08/26/2018  Medication Sig Dispense Refill  . gabapentin (NEURONTIN) 300 MG capsule Take 300 mg by mouth 2 (two) times daily    . lisinopril (ZESTRIL) 40 MG tablet Take 40 mg by mouth once daily    . albuterol sulfate (PROAIR DIGIHALER) 90 mcg/actuation aebs Inhale 180 mcg into the lungs every 4 (four) hours    . ipratropium-albuterol (COMBIVENT RESPIMAT) 20-100 mcg/actuation inhaler Inhale 2 inhalations into the lungs 2 (two) times daily (Patient not taking: Reported on 08/26/2018  ) 1 Inhaler 11  . nicotine (NICOTROL) 10 mg cartridge for inhalation Inhale 1 inhalation into the lungs as needed for up to 90 days (Patient not taking: Reported on 08/26/2018  ) 168 each  0  . nicotine polacrilex (NICORETTE) 4 MG lozenge Place 1 lozenge (4 mg total) inside cheek as needed for Smoking cessation for up to 90 days (Patient not taking: Reported on 08/26/2018  ) 108 lozenge 0   No facility-administered encounter medications on file as of 08/26/2018.        ALLERGIES:   Patient has no known allergies.   SOCIAL HISTORY:  Social History          Socioeconomic History  . Marital status: Life Partner    Spouse name: Not on file  . Number of children: Not on file  . Years of education: Not on file  . Highest education level: Not on file  Occupational History  . Not on file  Social Needs  . Financial resource strain: Not on file  . Food insecurity:    Worry: Not on file    Inability: Not on file  . Transportation needs:    Medical: Not on file    Non-medical: Not on file  Tobacco Use  . Smoking status: Current Every Day Smoker    Packs/day: 1.00    Years: 40.00    Pack years: 40.00  . Smokeless tobacco: Never Used  Substance and Sexual Activity  . Alcohol use: Yes    Alcohol/week: 24.0 standard drinks    Types: 24 Cans of beer per  week  . Drug use: Yes    Comment: marijauna  . Sexual activity: Not on file  Other Topics Concern  . Not on file  Social History Narrative  . Not on file      FAMILY HISTORY:       Family History  Problem Relation Age of Onset  . Lung cancer Mother   . Stroke Father   . Aneurysm Father        stomach  . Emphysema Paternal Grandfather      GENERAL REVIEW OF SYSTEMS:   General ROS: negative for - chills, fatigue, fever, weight gain or weight loss Allergy and Immunology ROS: negative for - hives  Hematological and Lymphatic ROS: negative for - bleeding problems or bruising, negative for palpable nodes Endocrine ROS: negative for - heat or cold intolerance, hair changes Respiratory ROS: negative for - cough, shortness of breath or wheezing Cardiovascular ROS: no chest pain or  palpitations GI ROS: negative for nausea, vomiting, abdominal pain, diarrhea, constipation Musculoskeletal ROS: negative for - joint swelling or muscle pain Neurological ROS: negative for - confusion, syncope Dermatological ROS: negative for pruritus and rash  PHYSICAL EXAM:     Vitals:   08/26/18 1144  BP: 128/78  Pulse: 68  Temp: 36.6 C (97.8 F)   Ht:177.8 cm (5\' 10" ) Wt:80.7 kg (178 lb) XTK:WIOX surface area is 2 meters squared. Body mass index is 25.54 kg/m.Marland Kitchen   GENERAL: Alert, active, oriented x3  HEENT: Pupils equal reactive to light. Extraocular movements are intact. Sclera clear. Palpebral conjunctiva normal red color.  NECK: Supple with no palpable mass and no adenopathy.  LUNGS: Bilateral rhonchus without wheezing.  HEART: Regular rhythm S1 and S2 without murmur.  ABDOMEN: Soft and depressible, nontender with no palpable mass, no hepatomegaly.  Right inguinal hernia palpable on Valsalva.  Soft and reducible.  EXTREMITIES: Well-developed well-nourished symmetrical with no dependent edema.  NEUROLOGICAL: Awake alert oriented, facial expression symmetrical, moving all extremities.      IMPRESSION:     Non-recurrent unilateral inguinal hernia without obstruction or gangrene [K40.90]  The patient presents with a symptomatic, reduciblerightinguinal hernia.  Again it was discussed about the diagnosis of inguinal hernia and its implication. The patient was oriented about the treatment alternatives (observation vs surgical repair). Due to patient symptoms, repair is recommended. Patient oriented about the surgical procedure, the use of mesh and its risk of complications such as: infection, bleeding, injury to vas deference, vasculature and testicle, injury to bowel or bladder, and chronic pain.  Patient was evaluated by pulmonologist but assessed that he is at low to moderate risk of pulmonary complications.  He recommended to perform incentive spirometer  after surgery.  Patient was oriented again about the risk of surgery due to his lung disease and the risk of complications.  He was also oriented again about the risk of recurrence due to coughing and due to smoking.  Patient reported that at this moment he is still symptomatic and he will be difficult for him to wait until he stop smoking.  He has decreased smoking from more than a pack of cigarettes to less than a pack of cigarettes per day.  He reports that he is willing to take the brace now and improve his quality of life with the repair of the hernia.   PLAN:  1. Right Inguinal hernia with mesh (73532) 2. CBC, CMP 3. Pulmonologist clearance (done) 4. Avoid Asprin 5 days before surgery.  5. Contact us if has any  question or concern.   Patient verbalized understanding, all questions were answered, and were agreeable with the plan outlined above.   Herbert Pun, MD  Electronically signed by Herbert Pun, MD

## 2018-09-02 ENCOUNTER — Other Ambulatory Visit: Payer: Self-pay

## 2018-09-02 ENCOUNTER — Encounter
Admission: RE | Admit: 2018-09-02 | Discharge: 2018-09-02 | Disposition: A | Discharge status: Home / Self Care | Payer: Medicare Other | Source: Ambulatory Visit | Attending: General Surgery | Admitting: General Surgery

## 2018-09-02 DIAGNOSIS — Z01818 Encounter for other preprocedural examination: Secondary | ICD-10-CM | POA: Insufficient documentation

## 2018-09-02 DIAGNOSIS — I10 Essential (primary) hypertension: Secondary | ICD-10-CM | POA: Diagnosis not present

## 2018-09-02 HISTORY — DX: Unspecified osteoarthritis, unspecified site: M19.90

## 2018-09-02 HISTORY — DX: Diverticulitis of intestine, part unspecified, without perforation or abscess without bleeding: K57.92

## 2018-09-02 NOTE — Patient Instructions (Signed)
Your procedure is scheduled on:Mon 3/16 Report to Day Surgery. To find out your arrival time please call [phone] between 1PM - 3PM on tomorrow.  Remember: Instructions that are not followed completely may result in serious medical risk,  up to and including death, or upon the discretion of your surgeon and anesthesiologist your  surgery may need to be rescheduled.     _X__ 1. Do not eat food after midnight the night before your procedure.                 No gum chewing or hard candies. You may drink clear liquids up to 2 hours                 before you are scheduled to arrive for your surgery- DO not drink clear                 liquids within 2 hours of the start of your surgery.                 Clear Liquids include:  water, apple juice without pulp, clear carbohydrate                 drink such as Clearfast of Gatorade, Black Coffee or Tea (Do not add                 anything to coffee or tea).  __X__2.  On the morning of surgery brush your teeth with toothpaste and water, you                may rinse your mouth with mouthwash if you wish.  Do not swallow any toothpaste of mouthwash.     _X__ 3.  No Alcohol for 24 hours before or after surgery.   _X__ 4.  Do Not Smoke or use e-cigarettes For 24 Hours Prior to Your Surgery.                 Do not use any chewable tobacco products for at least 6 hours prior to                 surgery.  ____  5.  Bring all medications with you on the day of surgery if instructed.   __x__  6.  Notify your doctor if there is any change in your medical condition      (cold, fever, infections).     Do not wear jewelry, make-up, hairpins, clips or nail polish. Do not wear lotions, powders, or perfumes. You may wear deodorant. Do not shave 48 hours prior to surgery. Men may shave face and neck. Do not bring valuables to the hospital.    [facility] is not responsible for any belongings or valuables.  Contacts, dentures or  bridgework may not be worn into surgery. Leave your suitcase in the car. After surgery it may be brought to your room. For patients admitted to the hospital, discharge time is determined by your treatment team.   Patients discharged the day of surgery will not be allowed to drive home.   Please read over the following fact sheets that you were given:    __x__ Take these medicines the morning of surgery with A SIP OF WATER:    1. gabapentin (NEURONTIN) 300 MG capsule  2.   3.   4.  5.  6.  ____ Fleet Enema (as directed)  x ____ Use CHG Soap as directed x ____ Use inhalers on the  day of surgery tiotropium (SPIRIVA) 18 MCG inhalation capsule  ____ Stop metformin 2 days prior to surgery    ____ Take 1/2 of usual insulin dose the night before surgery. No insulin the morning          of surgery.   ____ Stop Coumadin/Plavix/aspirin on   _x___ Stop Anti-inflammatories. No ibuprofen or Aleve  May take tylenol   ____ Stop supplements until after surgery.    ____ Bring C-Pap to the hospital.

## 2018-09-02 NOTE — Pre-Procedure Instructions (Signed)
Given instructions for incentive spirometer use after surgery.

## 2018-09-05 MED ORDER — CEFAZOLIN SODIUM-DEXTROSE 2-4 GM/100ML-% IV SOLN
2.0000 g | INTRAVENOUS | Status: AC
  Administered 2018-09-06: 2 g via INTRAVENOUS

## 2018-09-06 ENCOUNTER — Ambulatory Visit: Payer: Medicare Other | Admitting: Certified Registered Nurse Anesthetist

## 2018-09-06 ENCOUNTER — Other Ambulatory Visit: Payer: Self-pay

## 2018-09-06 ENCOUNTER — Encounter: Payer: Self-pay | Admitting: *Deleted

## 2018-09-06 ENCOUNTER — Ambulatory Visit
Admission: RE | Admit: 2018-09-06 | Discharge: 2018-09-06 | Disposition: A | Discharge status: Home / Self Care | Payer: Medicare Other | Attending: General Surgery | Admitting: General Surgery

## 2018-09-06 ENCOUNTER — Encounter
Admission: RE | Disposition: A | Discharge status: Home / Self Care | Payer: Self-pay | Source: Home / Self Care | Attending: General Surgery

## 2018-09-06 DIAGNOSIS — M1990 Unspecified osteoarthritis, unspecified site: Secondary | ICD-10-CM | POA: Diagnosis not present

## 2018-09-06 DIAGNOSIS — K4090 Unilateral inguinal hernia, without obstruction or gangrene, not specified as recurrent: Secondary | ICD-10-CM | POA: Diagnosis not present

## 2018-09-06 DIAGNOSIS — J439 Emphysema, unspecified: Secondary | ICD-10-CM | POA: Insufficient documentation

## 2018-09-06 DIAGNOSIS — F17210 Nicotine dependence, cigarettes, uncomplicated: Secondary | ICD-10-CM | POA: Diagnosis not present

## 2018-09-06 DIAGNOSIS — I10 Essential (primary) hypertension: Secondary | ICD-10-CM | POA: Insufficient documentation

## 2018-09-06 DIAGNOSIS — Z79899 Other long term (current) drug therapy: Secondary | ICD-10-CM | POA: Insufficient documentation

## 2018-09-06 HISTORY — PX: INGUINAL HERNIA REPAIR: SHX194

## 2018-09-06 LAB — URINE DRUG SCREEN, QUALITATIVE (ARMC ONLY)
Amphetamines, Ur Screen: NOT DETECTED
BARBITURATES, UR SCREEN: NOT DETECTED
Benzodiazepine, Ur Scrn: NOT DETECTED
Cannabinoid 50 Ng, Ur ~~LOC~~: POSITIVE — AB
Cocaine Metabolite,Ur ~~LOC~~: NOT DETECTED
MDMA (Ecstasy)Ur Screen: NOT DETECTED
Methadone Scn, Ur: NOT DETECTED
Opiate, Ur Screen: NOT DETECTED
Phencyclidine (PCP) Ur S: NOT DETECTED
Tricyclic, Ur Screen: NOT DETECTED

## 2018-09-06 SURGERY — REPAIR, HERNIA, INGUINAL, ADULT
Anesthesia: General | Laterality: Right

## 2018-09-06 MED ORDER — LIDOCAINE HCL (CARDIAC) PF 100 MG/5ML IV SOSY
PREFILLED_SYRINGE | INTRAVENOUS | Status: DC | PRN
  Administered 2018-09-06: 100 mg via INTRAVENOUS

## 2018-09-06 MED ORDER — BUPIVACAINE-EPINEPHRINE 0.25% -1:200000 IJ SOLN
INTRAMUSCULAR | Status: DC | PRN
  Administered 2018-09-06: 30 mL

## 2018-09-06 MED ORDER — PROPOFOL 10 MG/ML IV BOLUS
INTRAVENOUS | Status: AC
  Filled 2018-09-06: qty 20

## 2018-09-06 MED ORDER — ACETAMINOPHEN 10 MG/ML IV SOLN
INTRAVENOUS | Status: AC
  Filled 2018-09-06: qty 100

## 2018-09-06 MED ORDER — FAMOTIDINE 20 MG PO TABS
ORAL_TABLET | ORAL | Status: AC
  Administered 2018-09-06: 20 mg via ORAL
  Filled 2018-09-06: qty 1

## 2018-09-06 MED ORDER — IPRATROPIUM-ALBUTEROL 0.5-2.5 (3) MG/3ML IN SOLN
RESPIRATORY_TRACT | Status: AC
  Administered 2018-09-06: 3 mL via RESPIRATORY_TRACT
  Filled 2018-09-06: qty 3

## 2018-09-06 MED ORDER — CEFAZOLIN SODIUM-DEXTROSE 2-4 GM/100ML-% IV SOLN
INTRAVENOUS | Status: AC
  Filled 2018-09-06: qty 100

## 2018-09-06 MED ORDER — SUGAMMADEX SODIUM 200 MG/2ML IV SOLN
INTRAVENOUS | Status: AC
  Filled 2018-09-06: qty 2

## 2018-09-06 MED ORDER — HYDROCODONE-ACETAMINOPHEN 5-325 MG PO TABS
1.0000 | ORAL_TABLET | ORAL | Status: DC | PRN
  Administered 2018-09-06: 1 via ORAL

## 2018-09-06 MED ORDER — SUCCINYLCHOLINE CHLORIDE 20 MG/ML IJ SOLN
INTRAMUSCULAR | Status: DC | PRN
  Administered 2018-09-06: 120 mg via INTRAVENOUS

## 2018-09-06 MED ORDER — MIDAZOLAM HCL 2 MG/2ML IJ SOLN
INTRAMUSCULAR | Status: AC
  Filled 2018-09-06: qty 2

## 2018-09-06 MED ORDER — ACETAMINOPHEN 10 MG/ML IV SOLN
INTRAVENOUS | Status: DC | PRN
  Administered 2018-09-06: 1000 mg via INTRAVENOUS

## 2018-09-06 MED ORDER — PROPOFOL 10 MG/ML IV BOLUS
INTRAVENOUS | Status: DC | PRN
  Administered 2018-09-06: 150 mg via INTRAVENOUS

## 2018-09-06 MED ORDER — ONDANSETRON HCL 4 MG/2ML IJ SOLN: 4.0000 mg | Freq: Once | INTRAMUSCULAR | Status: DC | PRN

## 2018-09-06 MED ORDER — HYDROCODONE-ACETAMINOPHEN 5-325 MG PO TABS
ORAL_TABLET | ORAL | Status: AC
  Administered 2018-09-06: 1 via ORAL
  Filled 2018-09-06: qty 1

## 2018-09-06 MED ORDER — LACTATED RINGERS IV SOLN
INTRAVENOUS | Status: DC
  Administered 2018-09-06: 09:00:00 via INTRAVENOUS

## 2018-09-06 MED ORDER — ROCURONIUM BROMIDE 50 MG/5ML IV SOLN
INTRAVENOUS | Status: AC
  Filled 2018-09-06: qty 1

## 2018-09-06 MED ORDER — FENTANYL CITRATE (PF) 100 MCG/2ML IJ SOLN
INTRAMUSCULAR | Status: AC
  Filled 2018-09-06: qty 2

## 2018-09-06 MED ORDER — DEXAMETHASONE SODIUM PHOSPHATE 10 MG/ML IJ SOLN
INTRAMUSCULAR | Status: AC
  Filled 2018-09-06: qty 1

## 2018-09-06 MED ORDER — FENTANYL CITRATE (PF) 100 MCG/2ML IJ SOLN
INTRAMUSCULAR | Status: DC | PRN
  Administered 2018-09-06 (×5): 50 ug via INTRAVENOUS

## 2018-09-06 MED ORDER — HYDROCODONE-ACETAMINOPHEN 5-325 MG PO TABS: 1.0000 | ORAL_TABLET | ORAL | 0 refills | Status: AC | PRN

## 2018-09-06 MED ORDER — ONDANSETRON HCL 4 MG/2ML IJ SOLN
INTRAMUSCULAR | Status: DC | PRN
  Administered 2018-09-06: 4 mg via INTRAVENOUS

## 2018-09-06 MED ORDER — MIDAZOLAM HCL 2 MG/2ML IJ SOLN
INTRAMUSCULAR | Status: DC | PRN
  Administered 2018-09-06: 2 mg via INTRAVENOUS

## 2018-09-06 MED ORDER — ROCURONIUM BROMIDE 100 MG/10ML IV SOLN
INTRAVENOUS | Status: DC | PRN
  Administered 2018-09-06: 10 mg via INTRAVENOUS
  Administered 2018-09-06: 40 mg via INTRAVENOUS

## 2018-09-06 MED ORDER — SUGAMMADEX SODIUM 200 MG/2ML IV SOLN
INTRAVENOUS | Status: DC | PRN
  Administered 2018-09-06: 163.8 mg via INTRAVENOUS

## 2018-09-06 MED ORDER — ONDANSETRON HCL 4 MG/2ML IJ SOLN
INTRAMUSCULAR | Status: AC
  Filled 2018-09-06: qty 2

## 2018-09-06 MED ORDER — FENTANYL CITRATE (PF) 100 MCG/2ML IJ SOLN
25.0000 ug | INTRAMUSCULAR | Status: DC | PRN
  Administered 2018-09-06 (×4): 25 ug via INTRAVENOUS

## 2018-09-06 MED ORDER — LIDOCAINE HCL (PF) 2 % IJ SOLN
INTRAMUSCULAR | Status: AC
  Filled 2018-09-06: qty 10

## 2018-09-06 MED ORDER — IPRATROPIUM-ALBUTEROL 0.5-2.5 (3) MG/3ML IN SOLN
3.0000 mL | Freq: Once | RESPIRATORY_TRACT | Status: AC
  Administered 2018-09-06: 3 mL via RESPIRATORY_TRACT

## 2018-09-06 MED ORDER — FAMOTIDINE 20 MG PO TABS
20.0000 mg | ORAL_TABLET | Freq: Once | ORAL | Status: AC
  Administered 2018-09-06: 20 mg via ORAL

## 2018-09-06 MED ORDER — DEXAMETHASONE SODIUM PHOSPHATE 10 MG/ML IJ SOLN
INTRAMUSCULAR | Status: DC | PRN
  Administered 2018-09-06: 10 mg via INTRAVENOUS

## 2018-09-06 SURGICAL SUPPLY — 31 items
BLADE SURG 15 STRL LF DISP TIS (BLADE) ×1 IMPLANT
BLADE SURG 15 STRL SS (BLADE) ×2
CANISTER SUCT 1200ML W/VALVE (MISCELLANEOUS) ×3 IMPLANT
CHLORAPREP W/TINT 26 (MISCELLANEOUS) ×3 IMPLANT
COVER WAND RF STERILE (DRAPES) ×3 IMPLANT
DERMABOND ADVANCED (GAUZE/BANDAGES/DRESSINGS) ×2
DERMABOND ADVANCED .7 DNX12 (GAUZE/BANDAGES/DRESSINGS) ×1 IMPLANT
DRAIN PENROSE 1/4X12 LTX (DRAIN) ×3 IMPLANT
DRAPE LAPAROTOMY 100X77 ABD (DRAPES) ×3 IMPLANT
ELECT REM PT RETURN 9FT ADLT (ELECTROSURGICAL) ×3
ELECTRODE REM PT RTRN 9FT ADLT (ELECTROSURGICAL) ×1 IMPLANT
GLOVE BIO SURGEON STRL SZ 6.5 (GLOVE) ×4 IMPLANT
GLOVE BIO SURGEONS STRL SZ 6.5 (GLOVE) ×2
GLOVE BIOGEL PI IND STRL 6.5 (GLOVE) ×1 IMPLANT
GLOVE BIOGEL PI INDICATOR 6.5 (GLOVE) ×2
GOWN STRL REUS W/ TWL LRG LVL3 (GOWN DISPOSABLE) ×2 IMPLANT
GOWN STRL REUS W/TWL LRG LVL3 (GOWN DISPOSABLE) ×4
LABEL OR SOLS (LABEL) ×3 IMPLANT
MESH HERNIA 6X13 (Mesh General) ×3 IMPLANT
NEEDLE HYPO 22GX1.5 SAFETY (NEEDLE) ×3 IMPLANT
NS IRRIG 500ML POUR BTL (IV SOLUTION) ×3 IMPLANT
PACK BASIN MINOR ARMC (MISCELLANEOUS) ×3 IMPLANT
SUT MNCRL 4-0 (SUTURE) ×2
SUT MNCRL 4-0 27XMFL (SUTURE) ×1
SUT SURGILON 0 BLK (SUTURE) ×6 IMPLANT
SUT VIC AB 2-0 BRD 54 (SUTURE) ×3 IMPLANT
SUT VIC AB 2-0 CT2 27 (SUTURE) ×3 IMPLANT
SUT VIC AB 3-0 SH 27 (SUTURE) ×4
SUT VIC AB 3-0 SH 27X BRD (SUTURE) ×2 IMPLANT
SUTURE MNCRL 4-0 27XMF (SUTURE) ×1 IMPLANT
SYR 10ML LL (SYRINGE) ×3 IMPLANT

## 2018-09-06 NOTE — Anesthesia Procedure Notes (Signed)
Procedure Name: Intubation Performed by: Darry Kelnhofer, CRNA Pre-anesthesia Checklist: Patient identified, Patient being monitored, Timeout performed, Emergency Drugs available and Suction available Patient Re-evaluated:Patient Re-evaluated prior to induction Oxygen Delivery Method: Circle system utilized Preoxygenation: Pre-oxygenation with 100% oxygen Induction Type: IV induction Ventilation: Mask ventilation without difficulty Laryngoscope Size: Mac and 3 Grade View: Grade I Tube type: Oral Tube size: 7.0 mm Number of attempts: 1 Airway Equipment and Method: Stylet Placement Confirmation: ETT inserted through vocal cords under direct vision,  positive ETCO2 and breath sounds checked- equal and bilateral Secured at: 21 cm Tube secured with: Tape Dental Injury: Teeth and Oropharynx as per pre-operative assessment        

## 2018-09-06 NOTE — Interval H&P Note (Signed)
History and Physical Interval Note:  09/06/2018 11:44 AM  Ronnie Ingram  has presented today for surgery, with the diagnosis of K40.90 NON RECURRENT UNILATERAL INGUINAL HRNIA W/O OBSTRCTION OR GANGRENE.  The various methods of treatment have been discussed with the patient and family. After consideration of risks, benefits and other options for treatment, the patient has consented to  Procedure(s): HERNIA REPAIR INGUINAL ADULT, RIGHT (Right) as a surgical intervention.  The patient's history has been reviewed, patient examined, no change in status, stable for surgery.  I have reviewed the patient's chart and labs.  Right sided marked in the pre procedure room. Questions were answered to the patient's satisfaction.     Herbert Pun

## 2018-09-06 NOTE — Progress Notes (Signed)
Duo neb given for wheeze throughout

## 2018-09-06 NOTE — Op Note (Signed)
Preoperative diagnosis: Right Inguinal Hernia.  Postoperative diagnosis: Right Indirect Inguinal Hernia.  Procedure: Right Inguinal hernia repair with mesh  Anesthesia: General  Surgeon: Dr. Windell Moment  Wound Classification: Clean  Indications:  Patient is a 63 y.o. male developed a symptomatic right inguinal hernia. Repair was indicated to avoid complications of incarceration, obstruction and pain, and a prosthetic mesh repair was elected.  Findings: 1. Vas Deferens and cord structures identified and preserved 2. An indirect inguinal hernia was identified 3. Large Pre shaped Hernia System used for repair 4. Adequate hemostasis achieved  Description of procedure: The patient was taken to the operating room. A time-out was completed verifying correct patient, procedure, site, positioning, and implant(s) and/or special equipment prior to beginning this procedure. spinal anesthesia was induced. The right groin was prepped and draped in the usual sterile fashion. An incision was marked in a natural skin crease and planned to end near the pubic tubercle.  The skin crease incision was made with a knife and deepened through Scarpa's and Camper's fascia with electrocautery until the aponeurosis of the external oblique was encountered. This was cleaned and the external ring was exposed. Hemostasis was achieved in the wound. An incision was made in the midportion of the external oblique aponeurosis in the direction of its fibers. The ilioinguinal nerve was identified and protected throughout the dissection. Flaps of the external oblique were developed cephalad and inferiorly.  The cord was identified. It was gently dissected free at the pubic tubercle and encircled with a Penrose drain. Attention was directed to the anteromedial aspect of the cord, where an indirect hernia sac was identified. The sac was carefully dissected free of the cord down to the level of the internal ring. The vas and testicular  vessels were identified and protected from harm. The sac was opened and contents were reduced. A finger was passed into the peritoneal cavity and the floor of the inguinal canal assessed and found to be strong. The femoral canal was palpated and no hernia identified. The sac was twisted and suture ligated with 2-0 vicryl. Redundant sac was excised and submitted to pathology. The stump of the sac was checked for hemostasis and allowed to retract into the abdomen.  Attention then turned to the floor of the canal, which appeared to be grossly weakened without a well-defined defect or sac. The Pre shaped Hernia System mesh was inserted. Beginning at the pubic tubercle, the mesh was sutured to the inguinal ligament inferiorly and the conjoint tendon superiorly using interrupted 0 nonabsorbable sutures. Care was taken to assure that the mesh was placed in a relaxed fashion to avoid excessive tension and that no neurovascular structures were caught in the repair. Laterally, the tails of the mesh were crossed and the internal ring recreated.  Hemostasis was again checked. The Penrose drain was removed. The external oblique aponeurosis was closed with a running suture of 3-0 Vicryl, taking care not to catch the ilioinguinal nerve in the suture line. Scarpa's fascia was closed with interrupted 3-0 Vicryl.  The skin was closed with a subcuticular stitch of Monocryl 4-0. Dermabond was applied.  The testis was gently pulled down into its anatomic position in the scrotum.  The patient tolerated the procedure well and was taken to the postanesthesia care unit in stable condition.   Specimen: Hernia sac and cord lipoma  Complications: None  Estimated Blood Loss: 5 mL

## 2018-09-06 NOTE — Discharge Instructions (Signed)
AMBULATORY SURGERY  DISCHARGE INSTRUCTIONS   1) The drugs that you were given will stay in your system until tomorrow so for the next 24 hours you should not:  A) Drive an automobile B) Make any legal decisions C) Drink any alcoholic beverage   2) You may resume regular meals tomorrow.  Today it is better to start with liquids and gradually work up to solid foods.  You may eat anything you prefer, but it is better to start with liquids, then soup and crackers, and gradually work up to solid foods.   3) Please notify your doctor immediately if you have any unusual bleeding, trouble breathing, redness and pain at the surgery site, drainage, fever, or pain not relieved by medication. 4)   5) Your post-operative visit with Dr.                                     is: Date:                        Time:    Please call to schedule your post-operative visit.  6) Additional Instructions:     Diet: Resume home heart healthy regular diet.   Activity: No heavy lifting >20 pounds (children, pets, laundry, garbage) or strenuous activity until follow-up, but light activity and walking are encouraged. Do not drive or drink alcohol if taking narcotic pain medications.  Wound care: May shower with soapy water and pat dry (do not rub incisions), but no baths or submerging incision underwater until follow-up. (no swimming)   Medications: Resume all home medications. For mild to moderate pain: acetaminophen (Tylenol) or ibuprofen (if no kidney disease). Combining Tylenol with alcohol can substantially increase your risk of causing liver disease. Narcotic pain medications, if prescribed, can be used for severe pain, though may cause nausea, constipation, and drowsiness. Do not combine Tylenol and Norco within a 6 hour period as Norco contains Tylenol. If you do not need the narcotic pain medication, you do not need to fill the prescription.  May take Miralax if develops constipation.   Call office  [phone]) at any time if any questions, worsening pain, fevers/chills, bleeding, drainage from incision site, or other concerns.

## 2018-09-06 NOTE — Progress Notes (Signed)
Rhonchi   Nonproductive cough

## 2018-09-06 NOTE — Anesthesia Post-op Follow-up Note (Signed)
Anesthesia QCDR form completed.        

## 2018-09-06 NOTE — Anesthesia Preprocedure Evaluation (Addendum)
Anesthesia Evaluation  Patient identified by MRN, date of birth, ID band Patient awake    Reviewed: Allergy & Precautions, NPO status , Patient's Chart, lab work & pertinent test results  Airway Mallampati: II  TM Distance: >3 FB     Dental  (+) Caps   Pulmonary asthma , Current Smoker,    Pulmonary exam normal        Cardiovascular hypertension, Normal cardiovascular exam     Neuro/Psych negative neurological ROS  negative psych ROS   GI/Hepatic Neg liver ROS, diverticulitis   Endo/Other  negative endocrine ROS  Renal/GU negative Renal ROS  negative genitourinary   Musculoskeletal  (+) Arthritis , Osteoarthritis,    Abdominal Normal abdominal exam  (+)   Peds negative pediatric ROS (+)  Hematology negative hematology ROS (+)   Anesthesia Other Findings Past Medical History: No date: Arthritis No date: Asthma No date: Diverticulitis No date: Hypertension  Reproductive/Obstetrics                            Anesthesia Physical  Anesthesia Plan  ASA: II  Anesthesia Plan: General   Post-op Pain Management:    Induction: Intravenous  PONV Risk Score and Plan:   Airway Management Planned: Oral ETT  Additional Equipment:   Intra-op Plan:   Post-operative Plan:   Informed Consent: I have reviewed the patients History and Physical, chart, labs and discussed the procedure including the risks, benefits and alternatives for the proposed anesthesia with the patient or authorized representative who has indicated his/her understanding and acceptance.     Dental advisory given  Plan Discussed with: CRNA and Surgeon  Anesthesia Plan Comments:         Anesthesia Quick Evaluation

## 2018-09-07 ENCOUNTER — Encounter: Payer: Self-pay | Admitting: General Surgery

## 2018-09-07 LAB — SURGICAL PATHOLOGY

## 2018-09-07 NOTE — Transfer of Care (Signed)
Immediate Anesthesia Transfer of Care Note  Patient: Ronnie Ingram  Procedure(s) Performed: HERNIA REPAIR INGUINAL ADULT, RIGHT (Right )  Patient Location: PACU  Anesthesia Type:General  Level of Consciousness: drowsy  Airway & Oxygen Therapy: Patient Spontanous Breathing and Patient connected to face mask oxygen  Post-op Assessment: Report given to RN and Post -op Vital signs reviewed and stable  Post vital signs: Reviewed and stable  Last Vitals:  Vitals Value Taken Time  BP    Temp    Pulse    Resp    SpO2      Last Pain:  Vitals:   09/06/18 1507  TempSrc: Temporal  PainSc: 3          Complications: No apparent anesthesia complications

## 2018-09-07 NOTE — Anesthesia Postprocedure Evaluation (Signed)
Anesthesia Post Note  Patient: Ronnie Ingram  Procedure(s) Performed: HERNIA REPAIR INGUINAL ADULT, RIGHT (Right )  Patient location during evaluation: PACU Anesthesia Type: General Level of consciousness: awake and alert and oriented Pain management: pain level controlled Vital Signs Assessment: post-procedure vital signs reviewed and stable Respiratory status: spontaneous breathing Cardiovascular status: blood pressure returned to baseline Anesthetic complications: no     Last Vitals:  Vitals:   09/06/18 1500 09/06/18 1507  BP: 129/71 (!) 146/79  Pulse: 75 75  Resp: 11 16  Temp:  36.7 C  SpO2: 95% 95%    Last Pain:  Vitals:   09/07/18 0815  TempSrc:   PainSc: 8                  Vernice Mannina

## 2018-11-09 ENCOUNTER — Telehealth: Payer: Self-pay | Admitting: *Deleted

## 2018-11-09 DIAGNOSIS — Z87891 Personal history of nicotine dependence: Secondary | ICD-10-CM

## 2018-11-09 DIAGNOSIS — Z122 Encounter for screening for malignant neoplasm of respiratory organs: Secondary | ICD-10-CM

## 2018-11-09 NOTE — Telephone Encounter (Signed)
Patient has been re referred for lung screening and notified that annual lung cancer screening low dose CT scan is due currently or will be in near future. Confirmed that patient is within the age range of 55-77, and asymptomatic, (no signs or symptoms of lung cancer). Patient denies illness that would prevent curative treatment for lung cancer if found. Verified smoking history, (current, 51 pack year). The shared decision making visit was done 12/14/15. Patient is agreeable for CT scan being scheduled.

## 2018-11-17 ENCOUNTER — Ambulatory Visit
Admission: RE | Admit: 2018-11-17 | Discharge: 2018-11-17 | Disposition: A | Discharge status: Home / Self Care | Payer: Medicare Other | Source: Ambulatory Visit | Attending: Nurse Practitioner | Admitting: Nurse Practitioner

## 2018-11-17 ENCOUNTER — Other Ambulatory Visit: Payer: Self-pay

## 2018-11-17 DIAGNOSIS — Z122 Encounter for screening for malignant neoplasm of respiratory organs: Secondary | ICD-10-CM | POA: Diagnosis not present

## 2018-11-17 DIAGNOSIS — Z87891 Personal history of nicotine dependence: Secondary | ICD-10-CM | POA: Diagnosis present

## 2018-11-19 ENCOUNTER — Encounter: Payer: Self-pay | Admitting: *Deleted

## 2019-10-26 ENCOUNTER — Telehealth: Payer: Self-pay

## 2019-10-26 DIAGNOSIS — Z87891 Personal history of nicotine dependence: Secondary | ICD-10-CM

## 2019-10-26 DIAGNOSIS — Z122 Encounter for screening for malignant neoplasm of respiratory organs: Secondary | ICD-10-CM

## 2019-10-26 NOTE — Telephone Encounter (Signed)
I contacted patient at [phone] to schedule lung CT screening scan and received voice mail box that was not set up.  I called patient contact [name] at [phone] as chart indicates she handles patient's medical affairs due to him being hard of hearing.  [name] is agreeable to scheduled CT screening scan for patient.  Scan scheduled for May 27 at 4:30.  [name] answers that patient is a current smoker and smokes 2 packs a day. She also answers that she and patient have been vaccinated (both doses) for COVID vaccine.  She shares that patient now has [employer].  She knows where to take patient for scan as it is the same location as last year.

## 2019-10-31 NOTE — Addendum Note (Signed)
Addended by: Lieutenant Diego on: 10/31/2019 02:06 PM   Modules accepted: Orders

## 2019-10-31 NOTE — Telephone Encounter (Signed)
Smoking history: current, 53 pack year 

## 2019-11-14 NOTE — Telephone Encounter (Signed)
Patient informed/reminder of low dose lung cancer screening CT scan appointment on 11/17/19 @ 4:30

## 2019-11-17 ENCOUNTER — Ambulatory Visit
Admission: RE | Admit: 2019-11-17 | Discharge: 2019-11-17 | Disposition: A | Discharge status: Home / Self Care | Payer: Medicare Other | Source: Ambulatory Visit | Attending: Oncology | Admitting: Oncology

## 2019-11-17 ENCOUNTER — Other Ambulatory Visit: Payer: Self-pay

## 2019-11-17 DIAGNOSIS — Z87891 Personal history of nicotine dependence: Secondary | ICD-10-CM | POA: Diagnosis present

## 2019-11-17 DIAGNOSIS — Z122 Encounter for screening for malignant neoplasm of respiratory organs: Secondary | ICD-10-CM | POA: Diagnosis present

## 2019-11-18 ENCOUNTER — Encounter: Payer: Self-pay | Admitting: *Deleted

## 2020-01-18 ENCOUNTER — Encounter: Payer: Self-pay | Admitting: Cardiovascular Disease

## 2020-02-02 ENCOUNTER — Other Ambulatory Visit: Payer: Self-pay | Admitting: Otolaryngology

## 2020-02-02 DIAGNOSIS — K1379 Other lesions of oral mucosa: Secondary | ICD-10-CM

## 2020-02-06 ENCOUNTER — Other Ambulatory Visit: Payer: Self-pay | Admitting: Otolaryngology

## 2020-02-13 ENCOUNTER — Other Ambulatory Visit: Payer: Self-pay

## 2020-02-13 ENCOUNTER — Ambulatory Visit
Admission: RE | Admit: 2020-02-13 | Discharge: 2020-02-13 | Disposition: A | Discharge status: Home / Self Care | Payer: Medicare Other | Source: Ambulatory Visit | Attending: Otolaryngology | Admitting: Otolaryngology

## 2020-02-13 DIAGNOSIS — K1379 Other lesions of oral mucosa: Secondary | ICD-10-CM | POA: Diagnosis not present

## 2020-02-13 LAB — POCT I-STAT CREATININE: Creatinine, Ser: 1 mg/dL (ref 0.61–1.24)

## 2020-02-13 MED ORDER — IOHEXOL 300 MG/ML  SOLN
75.0000 mL | Freq: Once | INTRAMUSCULAR | Status: AC | PRN
  Administered 2020-02-13: 75 mL via INTRAVENOUS

## 2020-02-16 ENCOUNTER — Other Ambulatory Visit: Payer: Medicare Other

## 2020-02-16 ENCOUNTER — Encounter: Payer: Self-pay | Admitting: Radiation Oncology

## 2020-02-16 ENCOUNTER — Other Ambulatory Visit: Payer: Self-pay | Admitting: Licensed Clinical Social Worker

## 2020-02-16 ENCOUNTER — Ambulatory Visit
Admission: RE | Admit: 2020-02-16 | Discharge: 2020-02-16 | Disposition: A | Discharge status: Home / Self Care | Payer: Medicare Other | Source: Ambulatory Visit | Attending: Radiation Oncology | Admitting: Radiation Oncology

## 2020-02-16 ENCOUNTER — Other Ambulatory Visit: Payer: Self-pay

## 2020-02-16 VITALS — BP 108/68 | HR 74 | Temp 96.8°F | Resp 20 | Ht 70.0 in | Wt 156.5 lb

## 2020-02-16 DIAGNOSIS — R634 Abnormal weight loss: Secondary | ICD-10-CM | POA: Diagnosis not present

## 2020-02-16 DIAGNOSIS — Z79899 Other long term (current) drug therapy: Secondary | ICD-10-CM | POA: Insufficient documentation

## 2020-02-16 DIAGNOSIS — F17210 Nicotine dependence, cigarettes, uncomplicated: Secondary | ICD-10-CM | POA: Insufficient documentation

## 2020-02-16 DIAGNOSIS — I10 Essential (primary) hypertension: Secondary | ICD-10-CM | POA: Insufficient documentation

## 2020-02-16 DIAGNOSIS — C099 Malignant neoplasm of tonsil, unspecified: Secondary | ICD-10-CM | POA: Insufficient documentation

## 2020-02-16 DIAGNOSIS — M129 Arthropathy, unspecified: Secondary | ICD-10-CM | POA: Diagnosis not present

## 2020-02-16 DIAGNOSIS — J45909 Unspecified asthma, uncomplicated: Secondary | ICD-10-CM | POA: Diagnosis not present

## 2020-02-16 DIAGNOSIS — Z8719 Personal history of other diseases of the digestive system: Secondary | ICD-10-CM | POA: Diagnosis not present

## 2020-02-16 LAB — SURGICAL PATHOLOGY

## 2020-02-16 NOTE — Consult Note (Signed)
NEW PATIENT EVALUATION  Name: Ronnie Ingram  MRN: [id number]  Date:   02/16/2020     DOB: 02-10-1956   This 64 y.o. 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  REFERRING PHYSICIAN: Kerri Perches, PA-C  CHIEF COMPLAINT: No chief complaint on file.   DIAGNOSIS: The encounter diagnosis was Tonsil cancer ([place]).   PREVIOUS INVESTIGATIONS:  CT scans of head and neck chest reviewed Pathology report reviewed Clinical notes reviewed  HPI: Patient is a 64 year old male who presented with a self discovered mass in his left upper neck.  He was evaluated by ENT found to have a mass of his left tonsil.  CT scans showed a 3.4 cm level 2A lymph node.  He also had an asymmetric enlargement of left palatine tonsil roughly 2.8 cm.  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  Patient underwent biopsy showing invasive poorly differentiated squamous cell carcinoma p16 positive.  Patient had lung screening CT scan back in May showing no evidence to suggest lung pathology.  Patient is lost weight approximate 20 pounds over the past several months does have some difficulty swallowing.  He is now referred to radiation oncology for opinion.  PLANNED TREATMENT REGIMEN: Concurrent chemoradiation  PAST MEDICAL HISTORY:  has a past medical history of Arthritis, Asthma, Diverticulitis, and Hypertension.    PAST SURGICAL HISTORY:  Past Surgical History:  Procedure Laterality Date  . APPENDECTOMY    . FRACTURE SURGERY Left    ORIF left forearm  . HERNIA REPAIR Left    inguinal  . HERNIA REPAIR     abd  . INGUINAL HERNIA REPAIR Right 09/06/2018   Procedure: HERNIA REPAIR INGUINAL ADULT, RIGHT;  Surgeon: Herbert Pun, MD;  Location: [facility] ORS;  Service: General;  Laterality: Right;    FAMILY HISTORY: family history is not on file.  SOCIAL  HISTORY:  reports that he has been smoking cigarettes. He has a 15.00 pack-year smoking history. He has never used smokeless tobacco. He reports current alcohol use of about 12.0 standard drinks of alcohol per week. He reports current drug use. Drug: Marijuana.  ALLERGIES: Patient has no known allergies.  MEDICATIONS:  Current Outpatient Medications  Medication Sig Dispense Refill  . tiotropium (SPIRIVA) 18 MCG inhalation capsule Place 18 mcg into inhaler and inhale daily.    Marland Kitchen gabapentin (NEURONTIN) 300 MG capsule Take 300 mg by mouth 2 (two) times daily.     Marland Kitchen lisinopril (PRINIVIL,ZESTRIL) 40 MG tablet Take 40 mg by mouth daily.      No current facility-administered medications for this encounter.    ECOG PERFORMANCE STATUS:  1 - Symptomatic but completely ambulatory  REVIEW OF SYSTEMS: Patient denies any weight loss, fatigue, weakness, fever, chills or night sweats. Patient denies any loss of vision, blurred vision. Patient denies any ringing  of the ears or hearing loss. No irregular heartbeat. Patient denies heart murmur or history of fainting. Patient denies any chest pain or pain radiating to her upper extremities. Patient denies any shortness of breath, difficulty breathing at night, cough or hemoptysis. Patient denies any swelling in the lower legs. Patient denies any nausea vomiting, vomiting of blood, or coffee ground material in the vomitus. Patient denies any stomach pain. Patient states has had normal bowel movements no significant constipation or diarrhea. Patient denies any dysuria, hematuria or significant nocturia. Patient denies any  problems walking, swelling in the joints or loss of balance. Patient denies any skin changes, loss of hair or loss of weight. Patient denies any excessive worrying or anxiety or significant depression. Patient denies any problems with insomnia. Patient denies excessive thirst, polyuria, polydipsia. Patient denies any swollen glands, patient denies easy  bruising or easy bleeding. Patient denies any recent infections, allergies or URI. Patient "s visual fields have not changed significantly in recent time.   PHYSICAL EXAM: BP 108/68 (BP Location: Right Arm, Patient Position: Sitting, Cuff Size: Normal)   Pulse 74   Temp (!) 96.8 F (36 C)   Resp 20   Ht 5\' 10"  (1.778 m)   Wt 156 lb 8 oz (71 kg)   BMI 22.46 kg/m  Patient is a large fungating lesion in his left tonsillar region.  Also has palpable adenopathy in the left upper neck.  Well-developed well-nourished patient in NAD. HEENT reveals PERLA, EOMI, discs not visualized.  Oral cavity is clear. No oral mucosal lesions are identified. Neck is clear without evidence of cervical or supraclavicular adenopathy. Lungs are clear to A&P. Cardiac examination is essentially unremarkable with regular rate and rhythm without murmur rub or thrill. Abdomen is benign with no organomegaly or masses noted. Motor sensory and DTR levels are equal and symmetric in the upper and lower extremities. Cranial nerves II through XII are grossly intact. Proprioception is intact. No peripheral adenopathy or edema is identified. No motor or sensory levels are noted. Crude visual fields are within normal range.  LABORATORY DATA: Pathology report reviewed    RADIOLOGY RESULTS: CT scan of head and neck and chest reviewed compatible with above-stated findings.  PET CT scan ordered   IMPRESSION: Stage IVa at least squamous cell carcinoma of the left tonsil in 64 year old male p16 positive  PLAN: At this time I am making referral to medical oncology for consideration of concurrent chemoradiation.  I have also ordered a PET CT scan to complete his staging and to allow for [place] to do PET/CT fusion for treatment planning purposes.  Would plan on delivering 7000 cGy to his PET positive node and tonsillar primary using IMRT treatment planning and delivery.  I would choose IMRT to spare critical structures such as the salivary glands  esophagus spinal cord.  Risks and benefits of treatment occluding alteration of taste xerostomia fatigue oral mucositis skin reaction hair loss all were described in detail to the patient.  I will personally order CT simulation once we have PET CT scan and he has been evaluated by medical oncology.  Patient comprehends my treatment plan and recommendations well.  All appointments were made.  I would like to take this opportunity to thank you for allowing me to participate in the care of your patient.Noreene Filbert, MD

## 2020-02-16 NOTE — Addendum Note (Signed)
Encounter addended by: Manus Rudd, RN on: 02/16/2020 12:53 PM  Actions taken: Flowsheet accepted

## 2020-02-17 NOTE — Progress Notes (Signed)
Tumor Board Documentation  ADD DINAPOLI was presented by Dr Pryor Ochoa at our Tumor Board on 02/16/2020, which included representatives from medical oncology, radiation oncology, surgical oncology, internal medicine, navigation, pathology, radiology, surgical, research, pulmonology.  Angas currently presents as an external consult, for [place], for new positive pathology with history of the following treatments: surgical intervention(s).  Additionally, we reviewed previous medical and familial history, history of present illness, and recent lab results along with all available histopathologic and imaging studies. The tumor board considered available treatment options and made the following recommendations: Additional screening, Concurrent chemo-radiation therapy (PET Scan) Refer to Medical Oncology and Radiation Oncology  The following procedures/referrals were also placed: No orders of the defined types were placed in this encounter.   Clinical Trial Status: not discussed   Staging used: AJCC Stage Group  AJCC Staging: T: 2 N: 1   Group: Stage I Invasive Poorly Differentiated Squamous Cell Cancer of Tonsil   National site-specific guidelines NCCN were discussed with respect to the case.  Tumor board is a meeting of clinicians from various specialty areas who evaluate and discuss patients for whom a multidisciplinary approach is being considered. Final determinations in the plan of care are those of the provider(s). The responsibility for follow up of recommendations given during tumor board is that of the provider.   Today's extended care, comprehensive team conference, Yael was not present for the discussion and was not examined.   Multidisciplinary Tumor Board is a multidisciplinary case peer review process.  Decisions discussed in the Multidisciplinary Tumor Board reflect the opinions of the specialists present at the conference without having examined the patient.  Ultimately,  treatment and diagnostic decisions rest with the primary provider(s) and the patient.

## 2020-02-20 ENCOUNTER — Telehealth: Payer: Self-pay | Admitting: Oncology

## 2020-02-20 ENCOUNTER — Telehealth: Payer: Self-pay | Admitting: *Deleted

## 2020-02-20 NOTE — Telephone Encounter (Signed)
RN received a message from [place] in Radiation this stating pt's throat is so sore and he is losing weight. Dr Janese Banks said to move up his referral appt from 02/28/20 to tomm. HeatherM will be calling to change that appt to tomorrow.

## 2020-02-20 NOTE — Telephone Encounter (Signed)
Writer received request on this date to move patient's appt to 02-21-20. Writer phoned patient's significant other and scheduled appts for 02-21-20 at 1:30. Writer later received a message stating that patient is refusing to come to appt on 02-21-20 and wanted appt moved back to 03-02-20. Co-worker moved appt back to 03-02-20. Writer was later asked to phone patient back to talk with patient to see if he would be agreeable with coming on 02-21-20 so that [place] MD could help with patient's symptoms. Writer phoned patient's phone, but could not reach patient and voicemail was not set up. Writer phoned significant other who stated that patient refuses to come on 02-21-20 and is in agreement with coming on 03-02-20. Significant other stated that patient stated that he will be able to handle the pain and did not want to be seen sooner. Significant other stated that patient had just left and was not there for writer to speak with patient. Staff at the [facility] informed.

## 2020-02-21 ENCOUNTER — Ambulatory Visit: Payer: Medicare Other | Admitting: Oncology

## 2020-02-21 ENCOUNTER — Other Ambulatory Visit: Payer: Medicare Other

## 2020-02-28 ENCOUNTER — Ambulatory Visit: Payer: Medicare Other

## 2020-02-29 ENCOUNTER — Other Ambulatory Visit: Payer: Self-pay

## 2020-02-29 ENCOUNTER — Ambulatory Visit
Admission: RE | Admit: 2020-02-29 | Discharge: 2020-02-29 | Disposition: A | Discharge status: Home / Self Care | Payer: Medicare Other | Source: Ambulatory Visit | Attending: Radiation Oncology | Admitting: Radiation Oncology

## 2020-02-29 DIAGNOSIS — C099 Malignant neoplasm of tonsil, unspecified: Secondary | ICD-10-CM | POA: Diagnosis not present

## 2020-02-29 DIAGNOSIS — I714 Abdominal aortic aneurysm, without rupture: Secondary | ICD-10-CM | POA: Diagnosis not present

## 2020-02-29 DIAGNOSIS — I700 Atherosclerosis of aorta: Secondary | ICD-10-CM | POA: Diagnosis not present

## 2020-02-29 DIAGNOSIS — R590 Localized enlarged lymph nodes: Secondary | ICD-10-CM | POA: Diagnosis not present

## 2020-02-29 MED ORDER — FLUDEOXYGLUCOSE F - 18 (FDG) INJECTION
8.3700 | Freq: Once | INTRAVENOUS | Status: AC | PRN
  Administered 2020-02-29: 8.37 via INTRAVENOUS

## 2020-03-01 LAB — GLUCOSE, CAPILLARY: Glucose-Capillary: 118 mg/dL — ABNORMAL HIGH (ref 70–99)

## 2020-03-02 ENCOUNTER — Inpatient Hospital Stay: Payer: Medicare Other | Attending: Oncology | Admitting: Oncology

## 2020-03-02 ENCOUNTER — Other Ambulatory Visit: Payer: Self-pay

## 2020-03-02 ENCOUNTER — Encounter: Payer: Self-pay | Admitting: Oncology

## 2020-03-02 ENCOUNTER — Ambulatory Visit: Payer: Medicare Other | Admitting: Oncology

## 2020-03-02 ENCOUNTER — Other Ambulatory Visit: Payer: Medicare Other

## 2020-03-02 ENCOUNTER — Inpatient Hospital Stay: Payer: Medicare Other

## 2020-03-02 VITALS — BP 126/84 | HR 67 | Temp 98.7°F | Resp 16 | Ht 70.0 in | Wt 156.7 lb

## 2020-03-02 DIAGNOSIS — C109 Malignant neoplasm of oropharynx, unspecified: Secondary | ICD-10-CM | POA: Diagnosis present

## 2020-03-02 DIAGNOSIS — I714 Abdominal aortic aneurysm, without rupture, unspecified: Secondary | ICD-10-CM

## 2020-03-02 DIAGNOSIS — F17210 Nicotine dependence, cigarettes, uncomplicated: Secondary | ICD-10-CM | POA: Diagnosis not present

## 2020-03-02 DIAGNOSIS — Z5111 Encounter for antineoplastic chemotherapy: Secondary | ICD-10-CM | POA: Insufficient documentation

## 2020-03-02 DIAGNOSIS — Z7189 Other specified counseling: Secondary | ICD-10-CM | POA: Diagnosis not present

## 2020-03-02 DIAGNOSIS — C099 Malignant neoplasm of tonsil, unspecified: Secondary | ICD-10-CM

## 2020-03-02 LAB — CBC WITH DIFFERENTIAL/PLATELET
Abs Immature Granulocytes: 0.01 10*3/uL (ref 0.00–0.07)
Basophils Absolute: 0.1 10*3/uL (ref 0.0–0.1)
Basophils Relative: 1 %
Eosinophils Absolute: 0.1 10*3/uL (ref 0.0–0.5)
Eosinophils Relative: 2 %
HCT: 43.5 % (ref 39.0–52.0)
Hemoglobin: 15.4 g/dL (ref 13.0–17.0)
Immature Granulocytes: 0 %
Lymphocytes Relative: 31 %
Lymphs Abs: 2.5 10*3/uL (ref 0.7–4.0)
MCH: 34.8 pg — ABNORMAL HIGH (ref 26.0–34.0)
MCHC: 35.4 g/dL (ref 30.0–36.0)
MCV: 98.4 fL (ref 80.0–100.0)
Monocytes Absolute: 0.8 10*3/uL (ref 0.1–1.0)
Monocytes Relative: 9 %
Neutro Abs: 4.8 10*3/uL (ref 1.7–7.7)
Neutrophils Relative %: 57 %
Platelets: 296 10*3/uL (ref 150–400)
RBC: 4.42 MIL/uL (ref 4.22–5.81)
RDW: 13 % (ref 11.5–15.5)
WBC: 8.2 10*3/uL (ref 4.0–10.5)
nRBC: 0 % (ref 0.0–0.2)

## 2020-03-02 LAB — COMPREHENSIVE METABOLIC PANEL
ALT: 53 U/L — ABNORMAL HIGH (ref 0–44)
AST: 48 U/L — ABNORMAL HIGH (ref 15–41)
Albumin: 4.1 g/dL (ref 3.5–5.0)
Alkaline Phosphatase: 76 U/L (ref 38–126)
Anion gap: 9 (ref 5–15)
BUN: 12 mg/dL (ref 8–23)
CO2: 26 mmol/L (ref 22–32)
Calcium: 8.8 mg/dL — ABNORMAL LOW (ref 8.9–10.3)
Chloride: 102 mmol/L (ref 98–111)
Creatinine, Ser: 0.68 mg/dL (ref 0.61–1.24)
GFR calc Af Amer: >60 mL/min (ref >60)
GFR calc non Af Amer: >60 mL/min (ref >60)
Glucose, Bld: 131 mg/dL — ABNORMAL HIGH (ref 70–99)
Potassium: 4.4 mmol/L (ref 3.5–5.1)
Sodium: 137 mmol/L (ref 135–145)
Total Bilirubin: 0.7 mg/dL (ref 0.3–1.2)
Total Protein: 7.7 g/dL (ref 6.5–8.1)

## 2020-03-02 NOTE — Progress Notes (Signed)
Pt felt a nodule on left side of neck. It does interfere with swallowing. It is sore to touch. New dx tonsil cancer. Eats and drinks ok, bowels normal

## 2020-03-06 ENCOUNTER — Telehealth (INDEPENDENT_AMBULATORY_CARE_PROVIDER_SITE_OTHER): Payer: Self-pay

## 2020-03-06 ENCOUNTER — Telehealth: Payer: Self-pay | Admitting: Oncology

## 2020-03-06 ENCOUNTER — Inpatient Hospital Stay: Payer: Medicare Other

## 2020-03-06 ENCOUNTER — Inpatient Hospital Stay: Payer: Medicare Other | Admitting: Nurse Practitioner

## 2020-03-06 NOTE — Telephone Encounter (Signed)
Patient's wife called back and he is scheduled with Dr. Lucky Cowboy for a port placement on 03/14/20 with a 10:00 am arrival time to the [facility]. Covid testing on 03/12/20 between 8-1 pm at the [facility]. Pre-procedure instructions were discussed and will be mailed.

## 2020-03-06 NOTE — Telephone Encounter (Signed)
I attempted to contact the patient to schedule him for a port placement. A message was left on the mobile number for a return call and the home number voicemail was not set up.

## 2020-03-06 NOTE — Telephone Encounter (Signed)
Patient did not attend chemo care class on this date. Writer was asked to phone patient and reschedule. Writer phoned significant other on this date and reschedule chemo care class to 03-12-20. Significant other stated that she would make sure that patient is there.

## 2020-03-07 ENCOUNTER — Ambulatory Visit
Admission: RE | Admit: 2020-03-07 | Discharge: 2020-03-07 | Disposition: A | Discharge status: Home / Self Care | Payer: Medicare Other | Source: Ambulatory Visit | Attending: Radiation Oncology | Admitting: Radiation Oncology

## 2020-03-07 DIAGNOSIS — Z510 Encounter for antineoplastic radiation therapy: Secondary | ICD-10-CM | POA: Diagnosis not present

## 2020-03-07 DIAGNOSIS — C099 Malignant neoplasm of tonsil, unspecified: Secondary | ICD-10-CM | POA: Insufficient documentation

## 2020-03-08 ENCOUNTER — Encounter: Payer: Self-pay | Admitting: Oncology

## 2020-03-08 ENCOUNTER — Telehealth: Payer: Self-pay

## 2020-03-08 DIAGNOSIS — C109 Malignant neoplasm of oropharynx, unspecified: Secondary | ICD-10-CM | POA: Insufficient documentation

## 2020-03-08 MED ORDER — LORAZEPAM 0.5 MG PO TABS: 0.5000 mg | ORAL_TABLET | Freq: Four times a day (QID) | ORAL | 0 refills | Status: DC | PRN

## 2020-03-08 MED ORDER — DEXAMETHASONE 4 MG PO TABS: 8.0000 mg | ORAL_TABLET | Freq: Every day | ORAL | 1 refills | Status: DC

## 2020-03-08 MED ORDER — LIDOCAINE-PRILOCAINE 2.5-2.5 % EX CREA: TOPICAL_CREAM | CUTANEOUS | 3 refills | Status: DC

## 2020-03-08 MED ORDER — PROCHLORPERAZINE MALEATE 10 MG PO TABS: 10.0000 mg | ORAL_TABLET | Freq: Four times a day (QID) | ORAL | 1 refills | Status: DC | PRN

## 2020-03-08 MED ORDER — ONDANSETRON HCL 8 MG PO TABS: 8.0000 mg | ORAL_TABLET | Freq: Two times a day (BID) | ORAL | 1 refills | Status: DC | PRN

## 2020-03-08 NOTE — H&P (View-Only) (Signed)
START ON PATHWAY REGIMEN - Head and Neck     A cycle is every 7 days:     Cisplatin   **Always confirm dose/schedule in your pharmacy ordering system**  Patient Characteristics: Oropharynx, HPV Positive, Preoperative or Nonsurgical Candidate (Clinical Staging), cT0-4, cN1-3 or cT3-4, cN0 Disease Classification: Oropharynx HPV Status: Positive (+) Therapeutic Status: Preoperative or Nonsurgical Candidate (Clinical Staging) AJCC T Category: cT2 AJCC 8 Stage Grouping: I AJCC N Category: cN1 AJCC M Category: cM0 Intent of Therapy: Curative Intent, Discussed with Patient 

## 2020-03-08 NOTE — Progress Notes (Signed)
Hematology/Oncology Consult note [facility] [phone] [phone]  Patient Care Team: Renette Butters as PCP - General (Physician Assistant)   Name of the patient: Ronnie Ingram  [id number]  08-14-1955    Reason for referral-new diagnosis of squamous cell carcinoma of the oropharynx   Referring physician-Dr. Baruch Gouty  Date of visit: 03/08/20   History of presenting illness- Patient is a 64 year old male with incidentally discovered left neck mass and was referred to ENT.  CT soft tissue neck showed a hyperenhancement of left palatine tonsil measuring 2.8 cm.  Enlarged left level 2A lymph node measuring 3.4 cm in long axis and a smaller but conspicuous level 2B lymph node measuring 9 mm.  PET CT scan showed marked hypermetabolism in the left tonsillar region with an SUV of 8.8.  SUV 4.2 at the level 2 lymph node.  The smaller level 2B lymph node is superimposed on the dominant necrotic node.  4.8 x 4.4 cm abdominal aortic aneurysm.  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  Patient was seen by radiation oncology Dr. Donella Stade and plan is for concurrent chemoradiation.  He has been referred to medical oncology  Patient does report some difficulty swallowing and has been taking occasional Tylenol for the same.  Reports presently pain is well controlled with it.  He is able to eat solid foods without any significant difficulty.  ECOG PS- 1  Pain scale- 0  Review of systems- Review of Systems  Constitutional: Negative for chills, fever, malaise/fatigue and weight loss.  HENT: Negative for congestion, ear discharge and nosebleeds.   Eyes: Negative for blurred vision.  Respiratory: Negative for cough, hemoptysis, sputum production, shortness of breath and wheezing.   Cardiovascular: Negative for chest pain, palpitations, orthopnea and claudication.  Gastrointestinal: Negative for abdominal pain, blood in stool,  constipation, diarrhea, heartburn, melena, nausea and vomiting.  Genitourinary: Negative for dysuria, flank pain, frequency, hematuria and urgency.  Musculoskeletal: Negative for back pain, joint pain and myalgias.  Skin: Negative for rash.  Neurological: Negative for dizziness, tingling, focal weakness, seizures, weakness and headaches.  Endo/Heme/Allergies: Does not bruise/bleed easily.  Psychiatric/Behavioral: Negative for depression and suicidal ideas. The patient does not have insomnia.     No Known Allergies  Patient Active Problem List   Diagnosis Date Noted   Smoker 12/07/2017     Past Medical History:  Diagnosis Date   Arthritis    Asthma    Brain bleed ([place])    [place] years ago after falling off ladder   COPD (chronic obstructive pulmonary disease) ([place])    Diverticulitis    Hypertension    Tonsil cancer ([place])      Past Surgical History:  Procedure Laterality Date   APPENDECTOMY     FRACTURE SURGERY Left    ORIF left forearm   HERNIA REPAIR Left    inguinal   HERNIA REPAIR     abd   INGUINAL HERNIA REPAIR Right 09/06/2018   Procedure: HERNIA REPAIR INGUINAL ADULT, RIGHT;  Surgeon: Herbert Pun, MD;  Location: [facility] ORS;  Service: General;  Laterality: Right;    Social History   Socioeconomic History   Marital status: Significant Other    Spouse name: Not on file   Number of children: Not on file   Years of education: Not on file   Highest education level: Not on file  Occupational History   Not on file  Tobacco Use   Smoking status: Current Every Day Smoker  Packs/day: 0.50    Years: 30.00    Pack years: 15.00    Types: Cigarettes   Smokeless tobacco: Never Used  Vaping Use   Vaping Use: Never used  Substance and Sexual Activity   Alcohol use: Yes    Alcohol/week: 12.0 standard drinks    Types: 12 Cans of beer per week   Drug use: Yes    Types: Marijuana   Sexual activity: Not on file  Other Topics  Concern   Not on file  Social History Narrative   Not on file   Social Determinants of Health   Financial Resource Strain:    Difficulty of Paying Living Expenses: Not on file  Food Insecurity:    Worried About [place] in the Last Year: Not on file   Ran Out of Food in the Last Year: Not on file  Transportation Needs:    Lack of Transportation (Medical): Not on file   Lack of Transportation (Non-Medical): Not on file  Physical Activity:    Days of Exercise per Week: Not on file   Minutes of Exercise per Session: Not on file  Stress:    Feeling of Stress : Not on file  Social Connections:    Frequency of Communication with Friends and Family: Not on file   Frequency of Social Gatherings with Friends and Family: Not on file   Attends Religious Services: Not on file   Active Member of Clubs or Organizations: Not on file   Attends Archivist Meetings: Not on file   Marital Status: Not on file  Intimate Partner Violence:    Fear of Current or Ex-Partner: Not on file   Emotionally Abused: Not on file   Physically Abused: Not on file   Sexually Abused: Not on file     History reviewed. No pertinent family history.   Current Outpatient Medications:    gabapentin (NEURONTIN) 300 MG capsule, Take 300 mg by mouth 2 (two) times daily. , Disp: , Rfl:    lisinopril (PRINIVIL,ZESTRIL) 40 MG tablet, Take 40 mg by mouth daily. , Disp: , Rfl:    tiotropium (SPIRIVA) 18 MCG inhalation capsule, Place 18 mcg into inhaler and inhale daily., Disp: , Rfl:    Physical exam:  Vitals:   03/02/20 1334  Weight: 156 lb 11.2 oz (71.1 kg)  Height: 5\' 10"  (1.778 m)   Physical Exam HENT:     Mouth/Throat:     Mouth: Mucous membranes are moist.     Pharynx: Oropharynx is clear.  Cardiovascular:     Rate and Rhythm: Normal rate and regular rhythm.     Heart sounds: Normal heart sounds.  Pulmonary:     Effort: Pulmonary effort is normal.     Breath  sounds: Normal breath sounds.  Abdominal:     General: Bowel sounds are normal.     Palpations: Abdomen is soft.  Lymphadenopathy:     Comments: Palpable level 2 cervical lymph node roughly 3 cm in size.  Skin:    General: Skin is warm and dry.  Neurological:     Mental Status: He is alert and oriented to person, place, and time.        CMP Latest Ref Rng & Units 03/02/2020  Glucose 70 - 99 mg/dL 131(H)  BUN 8 - 23 mg/dL 12  Creatinine 0.61 - 1.24 mg/dL 0.68  Sodium 135 - 145 mmol/L 137  Potassium 3.5 - 5.1 mmol/L 4.4  Chloride 98 - 111 mmol/L  102  CO2 22 - 32 mmol/L 26  Calcium 8.9 - 10.3 mg/dL 8.8(L)  Total Protein 6.5 - 8.1 g/dL 7.7  Total Bilirubin 0.3 - 1.2 mg/dL 0.7  Alkaline Phos 38 - 126 U/L 76  AST 15 - 41 U/L 48(H)  ALT 0 - 44 U/L 53(H)   CBC Latest Ref Rng & Units 03/02/2020  WBC 4.0 - 10.5 K/uL 8.2  Hemoglobin 13.0 - 17.0 g/dL 15.4  Hematocrit 39 - 52 % 43.5  Platelets 150 - 400 K/uL 296    No images are attached to the encounter.  CT SOFT TISSUE NECK W CONTRAST  Result Date: 02/13/2020 CLINICAL DATA:  64 year old male with slightly painful left submandibular region mass reportedly x3 weeks. Smoker. EXAM: CT NECK WITH CONTRAST TECHNIQUE: Multidetector CT imaging of the neck was performed using the standard protocol following the bolus administration of intravenous contrast. CONTRAST:  65mL OMNIPAQUE IOHEXOL 300 MG/ML  SOLN COMPARISON:  Chest CT 11/17/2019.  Cervical spine CT 11/09/2012. FINDINGS: Pharynx and larynx: Mild motion artifact. The larynx is within normal limits. Hypopharynx within normal limits. At the level of the oropharynx and soft palate there is asymmetric enlargement and hyperenhancement of the left palatine tonsil (series 2, image 34 and coronal image 63) measuring roughly 28 mm diameter. The left parapharyngeal space is effaced. The nasopharynx, the right parapharyngeal space, and the retropharyngeal space remain within normal limits. Salivary  glands: Negative sublingual space. Submandibular glands appear within normal limits, aside from some extrinsic mass effect on the left, see lymph node section below. Likewise, parotid glands appear to remain within normal limits. Thyroid: Negative. Lymph nodes: Mixed cystic and solid enlarged left level 2A lymph node appears to correspond to the palpable abnormality as seen on series 2, image 48. This measures about 34 mm long axis, and some of the margins are indistinct. No regional inflammation. There is a smaller but conspicuous left level 2 B lymph node measuring 9 mm short axis on series 2, image 38 and also seen on coronal image 77. Other bilateral cervical nodes appear more symmetric and within normal limits. Vascular: Major vascular structures in the neck and at the skull base remain patent including the left IJ. Tortuous cervical ICAs. Carotid bifurcation calcified plaque. Calcified atherosclerosis at the skull base. Limited intracranial: Negative. Visualized orbits: Negative. Mastoids and visualized paranasal sinuses: Scattered paranasal sinus mucosal thickening. Tympanic cavities and mastoids remain clear. Skeleton: Mild motion artifact at the mandible. Bilateral posterior mandible dentition is absent. No acute or suspicious osseous lesion identified. Widespread cervical spine facet arthropathy. Upper chest: Large lung volumes. Visible upper lungs are clear. Calcified aortic atherosclerosis. No superior mediastinal lymphadenopathy. IMPRESSION: 1. The left neck palpable abnormality corresponds to a malignant appearing left level 2A lymph node measuring up to 3.4 cm. Associated asymmetric enlargement of the left palatine tonsil, roughly 2.8 cm. And small but suspicious 0.9 cm left 2B lymph node. This constellation is most suggestive of oropharyngeal squamous cell carcinoma with unilateral lymph node involvement. 2. Aortic Atherosclerosis (ICD10-I70.0). Electronically Signed   By: Genevie Ann M.D.   On:  02/13/2020 17:58   NM PET Image Initial (PI) Skull Base To Thigh  Result Date: 02/29/2020 CLINICAL DATA:  Initial treatment strategy for head neck cancer. EXAM: NUCLEAR MEDICINE PET SKULL BASE TO THIGH TECHNIQUE: 8.4 mCi F-18 FDG was injected intravenously. Full-ring PET imaging was performed from the skull base to thigh after the radiotracer. CT data was obtained and used for attenuation correction and anatomic localization.  Fasting blood glucose: 118 mg/dl COMPARISON:  Neck CT 02/13/2020 FINDINGS: Mediastinal blood pool activity: SUV max 1.7 Liver activity: SUV max NA NECK: Marked hypermetabolism is identified in the left tonsillar region with SUV max = 8.8. Left-sided necrotic level II lymph node is hypermetabolic with SUV max = 4.2. The smaller level II B lymph node seen on recent diagnostic neck CT is superimposed on activity from the dominant necrotic left cervical node. Incidental CT findings: none CHEST: No hypermetabolic mediastinal or hilar nodes. No suspicious pulmonary nodules on the CT scan. Incidental CT findings: Coronary artery calcification is evident. Atherosclerotic calcification is noted in the wall of the thoracic aorta. No suspicious pulmonary nodule or mass. Tiny nodule in the right lower lobe (107/3) is stable since lung cancer screening CT of 11/17/2019, characterized as benign. No focal airspace consolidation. There is no evidence of pleural effusion. ABDOMEN/PELVIS: No abnormal hypermetabolic activity within the liver, pancreas, adrenal glands, or spleen. No hypermetabolic lymph nodes in the abdomen or pelvis. Incidental CT findings: 4.8 x 4.4 cm abdominal aortic aneurysm. Diverticular changes noted left colon. SKELETON: No focal hypermetabolic activity to suggest skeletal metastasis. Incidental CT findings: none IMPRESSION: 1. Marked hypermetabolism in the left tonsillar region, compatible with known primary neoplasm. 2. Hypermetabolism associated with the metastatic left cervical  lymphadenopathy as characterized on recent diagnostic CT neck. 3. No evidence for hypermetabolic metastatic disease in the chest, abdomen, or pelvis. 4. 4.8 cm abdominal aortic aneurysm. Recommend follow-up every 6 months and vascular consultation. This recommendation follows ACR consensus guidelines: White Paper of the ACR Incidental Findings Committee II on Vascular Findings. J Am Coll Radiol 2013; 16:109-604. 5.  Aortic Atherosclerois (ICD10-170.0) Electronically Signed   By: Misty Stanley M.D.   On: 02/29/2020 16:28    Assessment and plan- Patient is a 64 y.o. 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  Also discussed CT scan and pathology findings with the patient in detail.  Patient found to have a primary left tonsillar mass along with 2 ipsilateral lymph nodes.  The dominant lymph node is more than 3 cm in size.  Given that this is HPV positive he still has stage I disease prognostically.  Per NCCN guidelines for ipsilateral adenopathy greater than 3 cm concurrent chemoradiation is recommended.  I recommend weekly cisplatin at 40 mg per metered squared for 7 weeks concurrent with radiation.  Discussed risks and benefits of cisplatin including all but not limited to nausea, vomiting, low blood counts, risk of infections and hospitalization.  Risk of peripheral neuropathy, hearing loss, AKI associated with cisplatin.  We will refer him to nutrition counseling.  Hold off on PEG tube at this time.  Emphasized the importance of adequate oral hydration.  Patient will need chemo teach for cisplatin as well as port placement.  Patient was found to have an abdominal aortic aneurysm of 4.4 cm on his PET CT scan which will need further evaluation by vascular surgery as well.  I will tentatively plan to start chemotherapy in 2  weeks time and his radiation starts.  Treatment will be given with a curative intent   Thank you for this kind referral and the opportunity to participate in the care of this pptient   Visit Diagnosis 1. Goals of care, counseling/discussion   2. Squamous cell carcinoma of oropharynx ([place])  3. Abdominal aortic aneurysm (AAA) without rupture ([place])     Dr. Randa Evens, MD, MPH [facility] [phone] 03/08/2020 8:45 AM

## 2020-03-08 NOTE — Telephone Encounter (Addendum)
Nutrition Assessment   Reason for Assessment:  Patient identified on Malnutrition Screening report for weight loss and poor appetite.    ASSESSMENT:  64 year old male with new diagnosis of tonsil cancer stage IV, HPV+.  Past medical history of smoker.    Called and spoke with wife/significant other Ronnie Ingram as number listed as main contact.  Ronnie Ingram reports that patient has been really trying to eat recently to improve nutrition.  Prior to that not eating much.  Eating about 2 meals per day (breakfast and dinner).  Reports that yesterday patient ate sausage, egg and cheese biscuit for breakfast and late at night ate cheeseburger, chips and pepsi.  Reports that he is not hungry. Denies trouble swallowing foods.  Patient can swallow all consistencies of foods. Ronnie Ingram reports more pain on the outside of neck vs on the inside.  Had head injury about 7 years ago that took away taste and smell which has effected appetite.  Also, has short term memory loss and will not remember much about what is being said.    Ronnie Ingram wanting to know why nutrition has not been addressed sooner.  Also concerned about patient setting in chemo class for 3 hours.  "He want be able to remember anything that is said."  Concerned about patient having mask on for radiation treatment and being in a close space and having panic attack.    Medications: decadron, ativan, zofran, compazine   Labs: glucose 131   Anthropometrics:   Height: 70 inches Weight: 156 lb (9/10) 11/17/19 180 lb BMI: 22  13% weight loss in the last 3 1/2 months, significant  Estimated Energy Needs  Kcals: 2100-2485  Protein: 105-124 g Fluid: 2.1 L   NUTRITION DIAGNOSIS: Inadequate oral intake related to cancer as evidenced by 13% weight loss in the last 3 1/2 months   INTERVENTION:  Encouraged 350 calorie shake or higher for more calories and protein. Examples discussed. Will email high calorie recipes. Discussed ways to increase calories and  protein.  Will email handouts to wife. Concerned about significant weight loss 13% prior to starting treatment and would recommend consideration of early placement of feeding tube to maximize nutrition during treatment.  Recommend SLP evaluation and follow up as patient receiving radiation.   Ronnie Ingram does not want RD to see patient during infusion as she does not want him to catch COVID.  Explained that RD is member of healthcare team and wears mask and eye wear during patient encounters.  She still declined.  RD offered to see patient after radiation treatments for follow-up with wife present and she declined saying that he will be ready to leave after treatment.  Wife said that RD could call him after 1 pm for follow-up.  Message sent to provider regarding recommendations.  RD offered to get in touch with RN who does chemo class to help answer questions and concerns about length of class and she declined.   RD encouraged wife to follow-up with radiation MD/team about concerns with wearing mask during radiation.     MONITORING, EVALUATION, GOAL: weight trends, intake   Next Visit: phone f/u in 2 weeks  Ronnie Ingram, [place], [place] Registered Dietitian [phone] (mobile)

## 2020-03-08 NOTE — Progress Notes (Signed)
START ON PATHWAY REGIMEN - Head and Neck     A cycle is every 7 days:     Cisplatin   **Always confirm dose/schedule in your pharmacy ordering system**  Patient Characteristics: Oropharynx, HPV Positive, Preoperative or Nonsurgical Candidate (Clinical Staging), cT0-4, cN1-3 or cT3-4, cN0 Disease Classification: Oropharynx HPV Status: Positive (+) Therapeutic Status: Preoperative or Nonsurgical Candidate (Clinical Staging) AJCC T Category: cT2 AJCC 8 Stage Grouping: I AJCC N Category: cN1 AJCC M Category: cM0 Intent of Therapy: Curative Intent, Discussed with Patient 

## 2020-03-12 ENCOUNTER — Other Ambulatory Visit: Payer: Self-pay

## 2020-03-12 ENCOUNTER — Inpatient Hospital Stay (HOSPITAL_BASED_OUTPATIENT_CLINIC_OR_DEPARTMENT_OTHER): Payer: Medicare Other | Admitting: Oncology

## 2020-03-12 ENCOUNTER — Other Ambulatory Visit
Admission: RE | Admit: 2020-03-12 | Discharge: 2020-03-12 | Disposition: A | Discharge status: Home / Self Care | Payer: Medicare Other | Source: Ambulatory Visit | Attending: Vascular Surgery | Admitting: Vascular Surgery

## 2020-03-12 ENCOUNTER — Inpatient Hospital Stay: Payer: Medicare Other

## 2020-03-12 DIAGNOSIS — C109 Malignant neoplasm of oropharynx, unspecified: Secondary | ICD-10-CM

## 2020-03-12 DIAGNOSIS — Z20822 Contact with and (suspected) exposure to covid-19: Secondary | ICD-10-CM | POA: Diagnosis not present

## 2020-03-12 DIAGNOSIS — Z01812 Encounter for preprocedural laboratory examination: Secondary | ICD-10-CM | POA: Diagnosis present

## 2020-03-12 DIAGNOSIS — Z510 Encounter for antineoplastic radiation therapy: Secondary | ICD-10-CM | POA: Diagnosis not present

## 2020-03-12 DIAGNOSIS — Z5111 Encounter for antineoplastic chemotherapy: Secondary | ICD-10-CM | POA: Diagnosis not present

## 2020-03-12 NOTE — Progress Notes (Signed)
Pharmacist Chemotherapy Monitoring - Initial Assessment    Anticipated start date: 03/19/2020   Regimen:  . Are orders appropriate based on the patient's diagnosis, regimen, and cycle? Yes . Does the plan date match the patient's scheduled date? Yes . Is the sequencing of drugs appropriate? Yes . Are the premedications appropriate for the patient's regimen? Yes . Prior Authorization for treatment is: Approved o If applicable, is the correct biosimilar selected based on the patient's insurance? not applicable  Organ Function and Labs: Marland Kitchen Are dose adjustments needed based on the patient's renal function, hepatic function, or hematologic function? Yes . Are appropriate labs ordered prior to the start of patient's treatment? Yes . Other organ system assessment, if indicated: N/A . The following baseline labs, if indicated, have been ordered: N/A  Dose Assessment: . Are the drug doses appropriate? Yes . Are the following correct: o Drug concentrations Yes o IV fluid compatible with drug Yes o Administration routes Yes o Timing of therapy Yes . If applicable, does the patient have documented access for treatment and/or plans for port-a-cath placement? yes . If applicable, have lifetime cumulative doses been properly documented and assessed? not applicable Lifetime Dose Tracking  No doses have been documented on this patient for the following tracked chemicals: Doxorubicin, Epirubicin, Idarubicin, Daunorubicin, Mitoxantrone, Bleomycin, Oxaliplatin, Carboplatin, Liposomal Doxorubicin  o   Toxicity Monitoring/Prevention: . The patient has the following take home antiemetics prescribed: Prochlorperazine . The patient has the following take home medications prescribed: N/A . Medication allergies and previous infusion related reactions, if applicable, have been reviewed and addressed. No . The patient's current medication list has been assessed for drug-drug interactions with their chemotherapy  regimen. no significant drug-drug interactions were identified on review.  Order Review: . Are the treatment plan orders signed? No . Is the patient scheduled to see a provider prior to their treatment? Yes  I verify that I have reviewed each item in the above checklist and answered each question accordingly.  Lemonte Al D 03/12/2020 12:40 PM

## 2020-03-13 ENCOUNTER — Other Ambulatory Visit (INDEPENDENT_AMBULATORY_CARE_PROVIDER_SITE_OTHER): Payer: Self-pay | Admitting: Nurse Practitioner

## 2020-03-13 ENCOUNTER — Telehealth: Payer: Self-pay | Admitting: *Deleted

## 2020-03-13 LAB — SARS CORONAVIRUS 2 (TAT 6-24 HRS): SARS Coronavirus 2: NEGATIVE

## 2020-03-13 NOTE — Telephone Encounter (Signed)
I called the significant other's phone number-Penny.  Got her voicemail and left her a message that the audiogram is going to stay 9/23 which is Thursday at 1130 over at the ENT office and they have already been to the ENT office before, and knows where to go.  I did get the dry run radiation appointment change from 3:45 to 2:15 on 9/23.  That is the earliest time but the shortest amount of waiting time.  I then tried calling the other number and got the patient and I told him all of this but he does not have a good memory; and I did tell him that I also called his girlfriend and left the same information on her line he said that would be great and if he has any questions they can call us back

## 2020-03-14 ENCOUNTER — Other Ambulatory Visit: Payer: Self-pay

## 2020-03-14 ENCOUNTER — Encounter: Payer: Self-pay | Admitting: Vascular Surgery

## 2020-03-14 ENCOUNTER — Ambulatory Visit
Admission: RE | Admit: 2020-03-14 | Discharge: 2020-03-14 | Disposition: A | Discharge status: Home / Self Care | Payer: Medicare Other | Attending: Vascular Surgery | Admitting: Vascular Surgery

## 2020-03-14 ENCOUNTER — Encounter
Admission: RE | Disposition: A | Discharge status: Home / Self Care | Payer: Self-pay | Source: Home / Self Care | Attending: Vascular Surgery

## 2020-03-14 DIAGNOSIS — M1990 Unspecified osteoarthritis, unspecified site: Secondary | ICD-10-CM | POA: Diagnosis not present

## 2020-03-14 DIAGNOSIS — C109 Malignant neoplasm of oropharynx, unspecified: Secondary | ICD-10-CM | POA: Diagnosis not present

## 2020-03-14 DIAGNOSIS — R599 Enlarged lymph nodes, unspecified: Secondary | ICD-10-CM | POA: Diagnosis not present

## 2020-03-14 DIAGNOSIS — I10 Essential (primary) hypertension: Secondary | ICD-10-CM | POA: Diagnosis not present

## 2020-03-14 DIAGNOSIS — Z79899 Other long term (current) drug therapy: Secondary | ICD-10-CM | POA: Diagnosis not present

## 2020-03-14 DIAGNOSIS — Z7952 Long term (current) use of systemic steroids: Secondary | ICD-10-CM | POA: Diagnosis not present

## 2020-03-14 DIAGNOSIS — J449 Chronic obstructive pulmonary disease, unspecified: Secondary | ICD-10-CM | POA: Diagnosis not present

## 2020-03-14 DIAGNOSIS — N185 Chronic kidney disease, stage 5: Secondary | ICD-10-CM

## 2020-03-14 DIAGNOSIS — I714 Abdominal aortic aneurysm, without rupture: Secondary | ICD-10-CM | POA: Insufficient documentation

## 2020-03-14 DIAGNOSIS — F17210 Nicotine dependence, cigarettes, uncomplicated: Secondary | ICD-10-CM | POA: Diagnosis not present

## 2020-03-14 DIAGNOSIS — C099 Malignant neoplasm of tonsil, unspecified: Secondary | ICD-10-CM

## 2020-03-14 HISTORY — PX: PORTA CATH INSERTION: CATH118285

## 2020-03-14 SURGERY — PORTA CATH INSERTION
Anesthesia: Moderate Sedation

## 2020-03-14 MED ORDER — ONDANSETRON HCL 4 MG/2ML IJ SOLN: 4.0000 mg | Freq: Four times a day (QID) | INTRAMUSCULAR | Status: DC | PRN

## 2020-03-14 MED ORDER — CHLORHEXIDINE GLUCONATE CLOTH 2 % EX PADS: 6.0000 | MEDICATED_PAD | Freq: Every day | CUTANEOUS | Status: DC

## 2020-03-14 MED ORDER — MIDAZOLAM HCL 2 MG/ML PO SYRP: 8.0000 mg | ORAL_SOLUTION | Freq: Once | ORAL | Status: DC | PRN

## 2020-03-14 MED ORDER — MIDAZOLAM HCL 5 MG/5ML IJ SOLN
INTRAMUSCULAR | Status: AC
  Filled 2020-03-14: qty 5

## 2020-03-14 MED ORDER — CEFAZOLIN SODIUM-DEXTROSE 2-4 GM/100ML-% IV SOLN
2.0000 g | Freq: Once | INTRAVENOUS | Status: AC
  Administered 2020-03-14: 2 g via INTRAVENOUS

## 2020-03-14 MED ORDER — SODIUM CHLORIDE 0.9 % IV SOLN: INTRAVENOUS | Status: DC

## 2020-03-14 MED ORDER — FAMOTIDINE 20 MG PO TABS: 40.0000 mg | ORAL_TABLET | Freq: Once | ORAL | Status: DC | PRN

## 2020-03-14 MED ORDER — DIPHENHYDRAMINE HCL 50 MG/ML IJ SOLN: 50.0000 mg | Freq: Once | INTRAMUSCULAR | Status: DC | PRN

## 2020-03-14 MED ORDER — METHYLPREDNISOLONE SODIUM SUCC 125 MG IJ SOLR: 125.0000 mg | Freq: Once | INTRAMUSCULAR | Status: DC | PRN

## 2020-03-14 MED ORDER — FENTANYL CITRATE (PF) 100 MCG/2ML IJ SOLN
INTRAMUSCULAR | Status: AC
  Filled 2020-03-14: qty 2

## 2020-03-14 MED ORDER — FENTANYL CITRATE (PF) 100 MCG/2ML IJ SOLN
INTRAMUSCULAR | Status: DC | PRN
Start: 2020-03-14 — End: 2020-03-14
  Administered 2020-03-14: 50 ug via INTRAVENOUS
  Administered 2020-03-14: 25 ug via INTRAVENOUS

## 2020-03-14 MED ORDER — HYDROMORPHONE HCL 1 MG/ML IJ SOLN: 1.0000 mg | Freq: Once | INTRAMUSCULAR | Status: DC | PRN

## 2020-03-14 MED ORDER — MIDAZOLAM HCL 2 MG/2ML IJ SOLN
INTRAMUSCULAR | Status: DC | PRN
  Administered 2020-03-14: 2 mg via INTRAVENOUS
  Administered 2020-03-14: 1 mg via INTRAVENOUS

## 2020-03-14 MED ORDER — SODIUM CHLORIDE 0.9 % IV SOLN
Freq: Once | INTRAVENOUS | Status: DC
  Filled 2020-03-14: qty 2

## 2020-03-14 SURGICAL SUPPLY — 9 items
DERMABOND ADVANCED (GAUZE/BANDAGES/DRESSINGS) ×2
DERMABOND ADVANCED .7 DNX12 (GAUZE/BANDAGES/DRESSINGS) ×1 IMPLANT
KIT PORT POWER 8FR ISP CVUE (Port) ×3 IMPLANT
PACK ANGIOGRAPHY (CUSTOM PROCEDURE TRAY) ×3 IMPLANT
SPONGE XRAY 4X4 16PLY STRL (MISCELLANEOUS) ×3 IMPLANT
SUT MNCRL AB 4-0 PS2 18 (SUTURE) ×3 IMPLANT
SUT PROLENE 0 CT 1 30 (SUTURE) ×3 IMPLANT
SUT VIC AB 3-0 SH 27 (SUTURE) ×2
SUT VIC AB 3-0 SH 27X BRD (SUTURE) ×1 IMPLANT

## 2020-03-14 NOTE — Interval H&P Note (Signed)
History and Physical Interval Note:  03/14/2020 11:00 AM  Ronnie Ingram  has presented today for surgery, with the diagnosis of Porta Cath Placement   Tonsillar Ca Covid  Sept 20.  The various methods of treatment have been discussed with the patient and family. After consideration of risks, benefits and other options for treatment, the patient has consented to  Procedure(s): PORTA CATH INSERTION (N/A) as a surgical intervention.  The patient's history has been reviewed, patient examined, no change in status, stable for surgery.  I have reviewed the patient's chart and labs.  Questions were answered to the patient's satisfaction.     Leotis Pain

## 2020-03-14 NOTE — Op Note (Signed)
      [facility]       Operative Note  Date: 03/14/2020  Preoperative diagnosis:  1. Tonsillar cancer  Postoperative diagnosis:  Same as above  Procedures: #1. Ultrasound guidance for vascular access to the right internal jugular vein. #2. Fluoroscopic guidance for placement of catheter. #3. Placement of CT compatible Port-A-Cath, right internal jugular vein.  Surgeon: Leotis Pain, MD.   Anesthesia: Local with moderate conscious sedation for approximately 28  minutes using 3 mg of Versed and 75 mcg of Fentanyl  Fluoroscopy time: less than 1 minute  Contrast used: 0  Estimated blood loss: 3 cc  Indication for the procedure:  The patient is a 64 y.o.male with tonsillar cancer.  The patient needs a Port-A-Cath for durable venous access, chemotherapy, lab draws, and CT scans. We are asked to place this. Risks and benefits were discussed and informed consent was obtained.  Description of procedure: The patient was brought to the vascular and interventional radiology suite.  Moderate conscious sedation was administered throughout the procedure during a face to face encounter with the patient with my supervision of the RN administering medicines and monitoring the patient's vital signs, pulse oximetry, telemetry and mental status throughout from the start of the procedure until the patient was taken to the recovery room. The right neck chest and shoulder were sterilely prepped and draped, and a sterile surgical field was created. Ultrasound was used to help visualize a patent right internal jugular vein. This was then accessed under direct ultrasound guidance without difficulty with the Seldinger needle and a permanent image was recorded. A J-wire was placed. After skin nick and dilatation, the peel-away sheath was then placed over the wire. I then anesthetized an area under the clavicle approximately 1-2 fingerbreadths. A transverse incision was created and an inferior pocket  was created with electrocautery and blunt dissection. The port was then brought onto the field, placed into the pocket and secured to the chest wall with 2 Prolene sutures. The catheter was connected to the port and tunneled from the subclavicular incision to the access site. Fluoroscopic guidance was then used to cut the catheter to an appropriate length. The catheter was then placed through the peel-away sheath and the peel-away sheath was removed. The catheter tip was parked in excellent location under fluorocoscopic guidance in the SVC just above the right atrium. The pocket was then irrigated with antibiotic impregnated saline and the wound was closed with a running 3-0 Vicryl and a 4-0 Monocryl. The access incision was closed with a single 4-0 Monocryl. The Huber needle was used to withdraw blood and flush the port with heparinized saline. Dermabond was then placed as a dressing. The patient tolerated the procedure well and was taken to the recovery room in stable condition.   Leotis Pain 03/14/2020 11:40 AM   This note was created with Dragon Medical transcription system. Any errors in dictation are purely unintentional.

## 2020-03-15 ENCOUNTER — Ambulatory Visit: Admission: RE | Admit: 2020-03-15 | Payer: Medicare Other | Source: Ambulatory Visit

## 2020-03-15 NOTE — Progress Notes (Signed)
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      Interval history-  Ronnie Ingram is a 64 yo male who presents to chemo care clinic today for initial meeting in preparation for starting chemotherapy. I introduced the chemo care clinic and we discussed that the role of the clinic is to assist those who are at an increased risk of emergency room visits and/or complications during the course of chemotherapy treatment. We discussed that the increased risk takes into account factors such as age, performance status, and co-morbidities. We also discussed that for some, this might include barriers to care such as not having a primary care provider, lack of insurance/transportation, or not being able to afford medications. We discussed that the goal of the program is to help prevent unplanned ER visits and help reduce complications during chemotherapy. We do this by discussing specific risk  factors to each individual and identifying ways that we can help improve these risk factors and reduce barriers to care.   ECOG FS:0 - Asymptomatic  Review of systems- Review of Systems  Constitutional: Negative.  Negative for chills, fever, malaise/fatigue and weight loss.  HENT: Negative for congestion, ear pain and tinnitus.   Eyes: Negative.  Negative for blurred vision and double vision.  Respiratory: Negative.  Negative for cough, sputum production and shortness of breath.   Cardiovascular: Negative.  Negative for chest pain, palpitations and leg swelling.  Gastrointestinal: Negative.  Negative for abdominal pain, constipation, diarrhea, nausea and vomiting.  Genitourinary: Negative for dysuria, frequency and urgency.  Musculoskeletal: Negative for back pain and falls.  Skin: Negative.  Negative for rash.  Neurological: Negative.  Negative for weakness and headaches.  Endo/Heme/Allergies: Negative.  Does not bruise/bleed easily.  Psychiatric/Behavioral: Negative.  Negative for depression. The patient is not nervous/anxious and does not have insomnia.      Current treatment- cisplatin and radiation  No Known Allergies  Past Medical History:  Diagnosis Date  . Arthritis   . Asthma   . Brain bleed ([place])    [place] years ago after falling off ladder  . COPD (chronic obstructive pulmonary disease) ([place])   . Diverticulitis   . Hypertension   . Tonsil cancer [facility])     Past Surgical History:  Procedure Laterality Date  . APPENDECTOMY    . FRACTURE SURGERY Left    ORIF left forearm  . HERNIA REPAIR Left    inguinal  . HERNIA REPAIR     abd  .  INGUINAL HERNIA REPAIR Right 09/06/2018   Procedure: HERNIA REPAIR INGUINAL ADULT, RIGHT;  Surgeon: Herbert Pun, MD;  Location: [facility] ORS;  Service: General;  Laterality: Right;  . PORTA CATH INSERTION N/A 03/14/2020   Procedure: PORTA CATH INSERTION;  Surgeon: Algernon Huxley, MD;  Location: [facility];  Service:  Cardiovascular;  Laterality: N/A;    Social History   Socioeconomic History  . Marital status: Significant Other    Spouse name: Not on file  . Number of children: Not on file  . Years of education: Not on file  . Highest education level: Not on file  Occupational History  . Not on file  Tobacco Use  . Smoking status: Current Every Day Smoker    Packs/day: 0.50    Years: 30.00    Pack years: 15.00    Types: Cigarettes  . Smokeless tobacco: Never Used  Vaping Use  . Vaping Use: Never used  Substance and Sexual Activity  . Alcohol use: Yes    Alcohol/week: 12.0 standard drinks    Types: 12 Cans of beer per week  . Drug use: Yes    Types: Marijuana  . Sexual activity: Not on file  Other Topics Concern  . Not on file  Social History Narrative  . Not on file   Social Determinants of Health   Financial Resource Strain:   . Difficulty of Paying Living Expenses: Not on file  Food Insecurity:   . Worried About Charity fundraiser in the Last Year: Not on file  . Ran Out of Food in the Last Year: Not on file  Transportation Needs:   . Lack of Transportation (Medical): Not on file  . Lack of Transportation (Non-Medical): Not on file  Physical Activity:   . Days of Exercise per Week: Not on file  . Minutes of Exercise per Session: Not on file  Stress:   . Feeling of Stress : Not on file  Social Connections:   . Frequency of Communication with Friends and Family: Not on file  . Frequency of Social Gatherings with Friends and Family: Not on file  . Attends Religious Services: Not on file  . Active Member of Clubs or Organizations: Not on file  . Attends Archivist Meetings: Not on file  . Marital Status: Not on file  Intimate Partner Violence:   . Fear of Current or Ex-Partner: Not on file  . Emotionally Abused: Not on file  . Physically Abused: Not on file  . Sexually Abused: Not on file    No family history on file.   Current Outpatient Medications:  .   dexamethasone (DECADRON) 4 MG tablet, Take 2 tablets (8 mg total) by mouth daily. Take daily x 3 days starting the day after cisplatin chemotherapy. Take with food. (Patient not taking: Reported on 03/14/2020), Disp: 30 tablet, Rfl: 1 .  gabapentin (NEURONTIN) 300 MG capsule, Take 300 mg by mouth 2 (two) times daily. , Disp: , Rfl:  .  lidocaine-prilocaine (EMLA) cream, Apply to affected area once, Disp: 30 g, Rfl: 3 .  lisinopril (PRINIVIL,ZESTRIL) 40 MG tablet, Take 40 mg by mouth daily. , Disp: , Rfl:  .  LORazepam (ATIVAN) 0.5 MG tablet, Take 1 tablet (0.5 mg total) by mouth every 6 (six) hours as needed (Nausea or vomiting). (Patient not taking: Reported on 03/14/2020), Disp: 30 tablet, Rfl: 0 .  ondansetron (ZOFRAN) 8 MG tablet, Take 1 tablet (8 mg total) by mouth 2 (two) times  daily as needed. Start on the third day after cisplatin chemotherapy. (Patient not taking: Reported on 03/14/2020), Disp: 30 tablet, Rfl: 1 .  prochlorperazine (COMPAZINE) 10 MG tablet, Take 1 tablet (10 mg total) by mouth every 6 (six) hours as needed (Nausea or vomiting). (Patient not taking: Reported on 03/14/2020), Disp: 30 tablet, Rfl: 1 .  tiotropium (SPIRIVA) 18 MCG inhalation capsule, Place 18 mcg into inhaler and inhale daily., Disp: , Rfl:   Physical exam: There were no vitals filed for this visit. Physical Exam Constitutional:      Appearance: Normal appearance.  HENT:     Head: Normocephalic and atraumatic.  Eyes:     Pupils: Pupils are equal, round, and reactive to light.  Cardiovascular:     Rate and Rhythm: Normal rate and regular rhythm.     Heart sounds: Normal heart sounds. No murmur heard.   Pulmonary:     Effort: Pulmonary effort is normal.     Breath sounds: Normal breath sounds. No wheezing.  Abdominal:     General: Bowel sounds are normal. There is no distension.     Palpations: Abdomen is soft.     Tenderness: There is no abdominal tenderness.  Musculoskeletal:        General: Normal  range of motion.     Cervical back: Normal range of motion.  Skin:    General: Skin is warm and dry.     Findings: No rash.  Neurological:     Mental Status: He is alert and oriented to person, place, and time.  Psychiatric:        Judgment: Judgment normal.      CMP Latest Ref Rng & Units 03/02/2020  Glucose 70 - 99 mg/dL 131(H)  BUN 8 - 23 mg/dL 12  Creatinine 0.61 - 1.24 mg/dL 0.68  Sodium 135 - 145 mmol/L 137  Potassium 3.5 - 5.1 mmol/L 4.4  Chloride 98 - 111 mmol/L 102  CO2 22 - 32 mmol/L 26  Calcium 8.9 - 10.3 mg/dL 8.8(L)  Total Protein 6.5 - 8.1 g/dL 7.7  Total Bilirubin 0.3 - 1.2 mg/dL 0.7  Alkaline Phos 38 - 126 U/L 76  AST 15 - 41 U/L 48(H)  ALT 0 - 44 U/L 53(H)   CBC Latest Ref Rng & Units 03/02/2020  WBC 4.0 - 10.5 K/uL 8.2  Hemoglobin 13.0 - 17.0 g/dL 15.4  Hematocrit 39 - 52 % 43.5  Platelets 150 - 400 K/uL 296    No images are attached to the encounter.  PERIPHERAL VASCULAR CATHETERIZATION  Result Date: 03/14/2020 See op note  NM PET Image Initial (PI) Skull Base To Thigh  Result Date: 02/29/2020 CLINICAL DATA:  Initial treatment strategy for head neck cancer. EXAM: NUCLEAR MEDICINE PET SKULL BASE TO THIGH TECHNIQUE: 8.4 mCi F-18 FDG was injected intravenously. Full-ring PET imaging was performed from the skull base to thigh after the radiotracer. CT data was obtained and used for attenuation correction and anatomic localization. Fasting blood glucose: 118 mg/dl COMPARISON:  Neck CT 02/13/2020 FINDINGS: Mediastinal blood pool activity: SUV max 1.7 Liver activity: SUV max NA NECK: Marked hypermetabolism is identified in the left tonsillar region with SUV max = 8.8. Left-sided necrotic level II lymph node is hypermetabolic with SUV max = 4.2. The smaller level II B lymph node seen on recent diagnostic neck CT is superimposed on activity from the dominant necrotic left cervical node. Incidental CT findings: none CHEST: No hypermetabolic mediastinal or hilar nodes.  No suspicious pulmonary nodules  on the CT scan. Incidental CT findings: Coronary artery calcification is evident. Atherosclerotic calcification is noted in the wall of the thoracic aorta. No suspicious pulmonary nodule or mass. Tiny nodule in the right lower lobe (107/3) is stable since lung cancer screening CT of 11/17/2019, characterized as benign. No focal airspace consolidation. There is no evidence of pleural effusion. ABDOMEN/PELVIS: No abnormal hypermetabolic activity within the liver, pancreas, adrenal glands, or spleen. No hypermetabolic lymph nodes in the abdomen or pelvis. Incidental CT findings: 4.8 x 4.4 cm abdominal aortic aneurysm. Diverticular changes noted left colon. SKELETON: No focal hypermetabolic activity to suggest skeletal metastasis. Incidental CT findings: none IMPRESSION: 1. Marked hypermetabolism in the left tonsillar region, compatible with known primary neoplasm. 2. Hypermetabolism associated with the metastatic left cervical lymphadenopathy as characterized on recent diagnostic CT neck. 3. No evidence for hypermetabolic metastatic disease in the chest, abdomen, or pelvis. 4. 4.8 cm abdominal aortic aneurysm. Recommend follow-up every 6 months and vascular consultation. This recommendation follows ACR consensus guidelines: White Paper of the ACR Incidental Findings Committee II on Vascular Findings. J Am Coll Radiol 2013; 22:025-427. 5.  Aortic Atherosclerois (ICD10-170.0) Electronically Signed   By: Misty Stanley M.D.   On: 02/29/2020 16:28     Assessment and plan- Patient is a 64 y.o. male who presents to [facility] for initial meeting in preparation for starting chemotherapy for the treatment of left neck mass.   CWC:BJSEGBT is a 64 year old male with incidentally discovered left neck mass and was referred to ENT.  CT soft tissue neck showed a hyperenhancement of left palatine tonsil measuring 2.8 cm.  Enlarged left level 2A lymph node measuring 3.4 cm in long axis and a  smaller but conspicuous level 2B lymph node measuring 9 mm.  PET CT scan showed marked hypermetabolism in the left tonsillar region with an SUV of 8.8.  SUV 4.2 at the level 2 lymph node.  The smaller level 2B lymph node is superimposed on the dominant necrotic node.  4.8 x 4.4 cm abdominal aortic aneurysm.  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  Patient was seen by radiation oncology Dr. Donella Stade and plan is for concurrent chemoradiation.   2. Chemo Care Clinic/High Risk for ER/Hospitalization during chemotherapy- We discussed the role of the chemo care clinic and identified patient specific risk factors. I discussed that patient was identified as high risk primarily based on: stage of disease  Patient has past medical history positive for: Past Medical History:  Diagnosis Date  . Arthritis   . Asthma   . Brain bleed ([place])    [place] years ago after falling off ladder  . COPD (chronic obstructive pulmonary disease) ([place])   . Diverticulitis   . Hypertension   . Tonsil cancer [facility])     Patient has past surgical history positive for: Past Surgical History:  Procedure Laterality Date  . APPENDECTOMY    . FRACTURE SURGERY Left    ORIF left forearm  . HERNIA REPAIR Left    inguinal  . HERNIA REPAIR     abd  . INGUINAL HERNIA REPAIR Right 09/06/2018   Procedure: HERNIA REPAIR INGUINAL ADULT, RIGHT;  Surgeon: Herbert Pun, MD;  Location: [facility] ORS;  Service: General;  Laterality: Right;  . PORTA CATH INSERTION N/A 03/14/2020   Procedure: PORTA CATH INSERTION;  Surgeon: Algernon Huxley, MD;  Location: [facility];  Service: Cardiovascular;  Laterality: N/A;    Based on our high risk symptom management report;  this patient has a high risk of ED utilization.  The percentage below indicates how "at risk "  this patient based on the factors in this table within one year.  \ General Risk Score: 2  Values used to calculate this score:   Points  Metrics       0        Age: 45      1        Hospital Admissions: 1      0        ED Visits: 0      1        Has Chronic Obstructive Pulmonary Disease: Yes      0        Has Diabetes: No      0        Has Congestive Heart Failure: No      0        Has liver disease: No      0        Has Depression: No      0        Current PCP: Kerri Perches, PA-C      0        Has Medicaid: No    3. We discussed that social determinants of health may have significant impacts on health and outcomes for cancer patients.  Today we discussed specific social determinants of performance status, alcohol use, depression, financial needs, food insecurity, housing, interpersonal violence, social connections, stress, tobacco use, and transportation.    After lengthy discussion the following were identified as areas of need:  None at this time  Outpatient services: We discussed options including home based and outpatient services, DME and care program. We discusssed that patients who participate in regular physical activity report fewer negative impacts of cancer and treatments and report less fatigue.   Financial Concerns: We discussed that living with cancer can create tremendous financial burden.  We discussed options for assistance. I asked that if assistance is needed in affording medications or paying bills to please let us know so that we can provide assistance. We discussed options for food including social services, [employer] ($50 every 2 weeks) and onsite food pantry.  We will also notify [facility] to see if cancer center can provide additional support.  Referral to Social work: Introduced Education officer, museum Elease Etienne and the services he can provide such as support with [employer], cell phone and gas vouchers.   Support groups: We discussed options for support groups at the cancer center. If interested, please notify nurse navigator to enroll. We discussed options for managing stress including healthy eating,  exercise as well as participating in no charge counseling services at the cancer center and support groups.  If these are of interest, patient can notify either myself or primary nursing team.We discussed options for management including medications and referral to quit Smart program  Transportation: We discussed options for transportation including acta, paratransit, bus routes, link transit, taxi/uber/lyft, and cancer center [place].  I have notified primary oncology team who will help assist with arranging Lucianne Lei transportation for appointments when/if needed. We also discussed options for transportation on short notice/acute visits.  Palliative care services: We have palliative care services available in the cancer center to discuss goals of care and advanced care planning.  Please let us know if you have any questions or would like to speak to our palliative nurse practitioner.  Symptom Management  Clinic: We discussed our symptom management clinic which is available for acute concerns while receiving treatment such as nausea, vomiting or diarrhea.  We can be reached via telephone at [phone] or through my chart.  We are available for virtual or in person visits on the same day from 830 to 4 PM Monday through Friday. He denies needing specific assistance at this time and He will be followed by Dr. Elroy Channel clinical team.  Plan: Discussed symptom management clinic. Discussed palliative care services. Discussed resources that are available here at the cancer center. Discussed medications and new prescriptions to begin treatment such as anti-nausea or steroids.   Disposition: RTC on 03/15/2020 for simulation to begin radiation. RTC on 03/19/2020 for lab work, MD assessment and to begin chemotherapy.  Visit Diagnosis 1. 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 He also understands that He can call clinic at any time with any  questions, concerns, or complaints.   Greater than 50% was spent in counseling and coordination of care with this patient including but not limited to discussion of the relevant topics above (See A&P) including, but not limited to diagnosis and management of acute and chronic medical conditions.   [place] [facility] [place]  CC: Dr. Janese Banks

## 2020-03-15 NOTE — H&P (View-Only) (Signed)
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      Interval history-  Ronnie Ingram is a 64 yo male who presents to chemo care clinic today for initial meeting in preparation for starting chemotherapy. I introduced the chemo care clinic and we discussed that the role of the clinic is to assist those who are at an increased risk of emergency room visits and/or complications during the course of chemotherapy treatment. We discussed that the increased risk takes into account factors such as age, performance status, and co-morbidities. We also discussed that for some, this might include barriers to care such as not having a primary care provider, lack of insurance/transportation, or not being able to afford medications. We discussed that the goal of the program is to help prevent unplanned ER visits and help reduce complications during chemotherapy. We do this by discussing specific risk  factors to each individual and identifying ways that we can help improve these risk factors and reduce barriers to care.   ECOG FS:0 - Asymptomatic  Review of systems- Review of Systems  Constitutional: Negative.  Negative for chills, fever, malaise/fatigue and weight loss.  HENT: Negative for congestion, ear pain and tinnitus.   Eyes: Negative.  Negative for blurred vision and double vision.  Respiratory: Negative.  Negative for cough, sputum production and shortness of breath.   Cardiovascular: Negative.  Negative for chest pain, palpitations and leg swelling.  Gastrointestinal: Negative.  Negative for abdominal pain, constipation, diarrhea, nausea and vomiting.  Genitourinary: Negative for dysuria, frequency and urgency.  Musculoskeletal: Negative for back pain and falls.  Skin: Negative.  Negative for rash.  Neurological: Negative.  Negative for weakness and headaches.  Endo/Heme/Allergies: Negative.  Does not bruise/bleed easily.  Psychiatric/Behavioral: Negative.  Negative for depression. The patient is not nervous/anxious and does not have insomnia.      Current treatment- cisplatin and radiation  No Known Allergies  Past Medical History:  Diagnosis Date  . Arthritis   . Asthma   . Brain bleed ([place])    [place]ak years ago after falling off ladder  . COPD (chronic obstructive pulmonary disease) ([place])   . Diverticulitis   . Hypertension   . Tonsil cancer [facility])     Past Surgical History:  Procedure Laterality Date  . APPENDECTOMY    . FRACTURE SURGERY Left    ORIF left forearm  . HERNIA REPAIR Left    inguinal  . HERNIA REPAIR     abd  .  INGUINAL HERNIA REPAIR Right 09/06/2018   Procedure: HERNIA REPAIR INGUINAL ADULT, RIGHT;  Surgeon: Herbert Pun, MD;  Location: [facility] ORS;  Service: General;  Laterality: Right;  . PORTA CATH INSERTION N/A 03/14/2020   Procedure: PORTA CATH INSERTION;  Surgeon: Algernon Huxley, MD;  Location: [facility];  Service:  Cardiovascular;  Laterality: N/A;    Social History   Socioeconomic History  . Marital status: Significant Other    Spouse name: Not on file  . Number of children: Not on file  . Years of education: Not on file  . Highest education level: Not on file  Occupational History  . Not on file  Tobacco Use  . Smoking status: Current Every Day Smoker    Packs/day: 0.50    Years: 30.00    Pack years: 15.00    Types: Cigarettes  . Smokeless tobacco: Never Used  Vaping Use  . Vaping Use: Never used  Substance and Sexual Activity  . Alcohol use: Yes    Alcohol/week: 12.0 standard drinks    Types: 12 Cans of beer per week  . Drug use: Yes    Types: Marijuana  . Sexual activity: Not on file  Other Topics Concern  . Not on file  Social History Narrative  . Not on file   Social Determinants of Health   Financial Resource Strain:   . Difficulty of Paying Living Expenses: Not on file  Food Insecurity:   . Worried About Charity fundraiser in the Last Year: Not on file  . Ran Out of Food in the Last Year: Not on file  Transportation Needs:   . Lack of Transportation (Medical): Not on file  . Lack of Transportation (Non-Medical): Not on file  Physical Activity:   . Days of Exercise per Week: Not on file  . Minutes of Exercise per Session: Not on file  Stress:   . Feeling of Stress : Not on file  Social Connections:   . Frequency of Communication with Friends and Family: Not on file  . Frequency of Social Gatherings with Friends and Family: Not on file  . Attends Religious Services: Not on file  . Active Member of Clubs or Organizations: Not on file  . Attends Archivist Meetings: Not on file  . Marital Status: Not on file  Intimate Partner Violence:   . Fear of Current or Ex-Partner: Not on file  . Emotionally Abused: Not on file  . Physically Abused: Not on file  . Sexually Abused: Not on file    No family history on file.   Current Outpatient Medications:  .   dexamethasone (DECADRON) 4 MG tablet, Take 2 tablets (8 mg total) by mouth daily. Take daily x 3 days starting the day after cisplatin chemotherapy. Take with food. (Patient not taking: Reported on 03/14/2020), Disp: 30 tablet, Rfl: 1 .  gabapentin (NEURONTIN) 300 MG capsule, Take 300 mg by mouth 2 (two) times daily. , Disp: , Rfl:  .  lidocaine-prilocaine (EMLA) cream, Apply to affected area once, Disp: 30 g, Rfl: 3 .  lisinopril (PRINIVIL,ZESTRIL) 40 MG tablet, Take 40 mg by mouth daily. , Disp: , Rfl:  .  LORazepam (ATIVAN) 0.5 MG tablet, Take 1 tablet (0.5 mg total) by mouth every 6 (six) hours as needed (Nausea or vomiting). (Patient not taking: Reported on 03/14/2020), Disp: 30 tablet, Rfl: 0 .  ondansetron (ZOFRAN) 8 MG tablet, Take 1 tablet (8 mg total) by mouth 2 (two) times  daily as needed. Start on the third day after cisplatin chemotherapy. (Patient not taking: Reported on 03/14/2020), Disp: 30 tablet, Rfl: 1 .  prochlorperazine (COMPAZINE) 10 MG tablet, Take 1 tablet (10 mg total) by mouth every 6 (six) hours as needed (Nausea or vomiting). (Patient not taking: Reported on 03/14/2020), Disp: 30 tablet, Rfl: 1 .  tiotropium (SPIRIVA) 18 MCG inhalation capsule, Place 18 mcg into inhaler and inhale daily., Disp: , Rfl:   Physical exam: There were no vitals filed for this visit. Physical Exam Constitutional:      Appearance: Normal appearance.  HENT:     Head: Normocephalic and atraumatic.  Eyes:     Pupils: Pupils are equal, round, and reactive to light.  Cardiovascular:     Rate and Rhythm: Normal rate and regular rhythm.     Heart sounds: Normal heart sounds. No murmur heard.   Pulmonary:     Effort: Pulmonary effort is normal.     Breath sounds: Normal breath sounds. No wheezing.  Abdominal:     General: Bowel sounds are normal. There is no distension.     Palpations: Abdomen is soft.     Tenderness: There is no abdominal tenderness.  Musculoskeletal:        General: Normal  range of motion.     Cervical back: Normal range of motion.  Skin:    General: Skin is warm and dry.     Findings: No rash.  Neurological:     Mental Status: He is alert and oriented to person, place, and time.  Psychiatric:        Judgment: Judgment normal.      CMP Latest Ref Rng & Units 03/02/2020  Glucose 70 - 99 mg/dL 131(H)  BUN 8 - 23 mg/dL 12  Creatinine 0.61 - 1.24 mg/dL 0.68  Sodium 135 - 145 mmol/L 137  Potassium 3.5 - 5.1 mmol/L 4.4  Chloride 98 - 111 mmol/L 102  CO2 22 - 32 mmol/L 26  Calcium 8.9 - 10.3 mg/dL 8.8(L)  Total Protein 6.5 - 8.1 g/dL 7.7  Total Bilirubin 0.3 - 1.2 mg/dL 0.7  Alkaline Phos 38 - 126 U/L 76  AST 15 - 41 U/L 48(H)  ALT 0 - 44 U/L 53(H)   CBC Latest Ref Rng & Units 03/02/2020  WBC 4.0 - 10.5 K/uL 8.2  Hemoglobin 13.0 - 17.0 g/dL 15.4  Hematocrit 39 - 52 % 43.5  Platelets 150 - 400 K/uL 296    No images are attached to the encounter.  PERIPHERAL VASCULAR CATHETERIZATION  Result Date: 03/14/2020 See op note  NM PET Image Initial (PI) Skull Base To Thigh  Result Date: 02/29/2020 CLINICAL DATA:  Initial treatment strategy for head neck cancer. EXAM: NUCLEAR MEDICINE PET SKULL BASE TO THIGH TECHNIQUE: 8.4 mCi F-18 FDG was injected intravenously. Full-ring PET imaging was performed from the skull base to thigh after the radiotracer. CT data was obtained and used for attenuation correction and anatomic localization. Fasting blood glucose: 118 mg/dl COMPARISON:  Neck CT 02/13/2020 FINDINGS: Mediastinal blood pool activity: SUV max 1.7 Liver activity: SUV max NA NECK: Marked hypermetabolism is identified in the left tonsillar region with SUV max = 8.8. Left-sided necrotic level II lymph node is hypermetabolic with SUV max = 4.2. The smaller level II B lymph node seen on recent diagnostic neck CT is superimposed on activity from the dominant necrotic left cervical node. Incidental CT findings: none CHEST: No hypermetabolic mediastinal or hilar nodes.  No suspicious pulmonary nodules  on the CT scan. Incidental CT findings: Coronary artery calcification is evident. Atherosclerotic calcification is noted in the wall of the thoracic aorta. No suspicious pulmonary nodule or mass. Tiny nodule in the right lower lobe (107/3) is stable since lung cancer screening CT of 11/17/2019, characterized as benign. No focal airspace consolidation. There is no evidence of pleural effusion. ABDOMEN/PELVIS: No abnormal hypermetabolic activity within the liver, pancreas, adrenal glands, or spleen. No hypermetabolic lymph nodes in the abdomen or pelvis. Incidental CT findings: 4.8 x 4.4 cm abdominal aortic aneurysm. Diverticular changes noted left colon. SKELETON: No focal hypermetabolic activity to suggest skeletal metastasis. Incidental CT findings: none IMPRESSION: 1. Marked hypermetabolism in the left tonsillar region, compatible with known primary neoplasm. 2. Hypermetabolism associated with the metastatic left cervical lymphadenopathy as characterized on recent diagnostic CT neck. 3. No evidence for hypermetabolic metastatic disease in the chest, abdomen, or pelvis. 4. 4.8 cm abdominal aortic aneurysm. Recommend follow-up every 6 months and vascular consultation. This recommendation follows ACR consensus guidelines: White Paper of the ACR Incidental Findings Committee II on Vascular Findings. J Am Coll Radiol 2013; 77:939-030. 5.  Aortic Atherosclerois (ICD10-170.0) Electronically Signed   By: Misty Stanley M.D.   On: 02/29/2020 16:28     Assessment and plan- Patient is a 64 y.o. male who presents to [facility] for initial meeting in preparation for starting chemotherapy for the treatment of left neck mass.   [id number] is a 64 year old male with incidentally discovered left neck mass and was referred to ENT.  CT soft tissue neck showed a hyperenhancement of left palatine tonsil measuring 2.8 cm.  Enlarged left level 2A lymph node measuring 3.4 cm in long axis and a  smaller but conspicuous level 2B lymph node measuring 9 mm.  PET CT scan showed marked hypermetabolism in the left tonsillar region with an SUV of 8.8.  SUV 4.2 at the level 2 lymph node.  The smaller level 2B lymph node is superimposed on the dominant necrotic node.  4.8 x 4.4 cm abdominal aortic aneurysm.  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  Patient was seen by radiation oncology Dr. Donella Stade and plan is for concurrent chemoradiation.   2. Chemo Care Clinic/High Risk for ER/Hospitalization during chemotherapy- We discussed the role of the chemo care clinic and identified patient specific risk factors. I discussed that patient was identified as high risk primarily based on: stage of disease  Patient has past medical history positive for: Past Medical History:  Diagnosis Date  . Arthritis   . Asthma   . Brain bleed ([place])    [place] years ago after falling off ladder  . COPD (chronic obstructive pulmonary disease) ([place])   . Diverticulitis   . Hypertension   . Tonsil cancer [facility])     Patient has past surgical history positive for: Past Surgical History:  Procedure Laterality Date  . APPENDECTOMY    . FRACTURE SURGERY Left    ORIF left forearm  . HERNIA REPAIR Left    inguinal  . HERNIA REPAIR     abd  . INGUINAL HERNIA REPAIR Right 09/06/2018   Procedure: HERNIA REPAIR INGUINAL ADULT, RIGHT;  Surgeon: Herbert Pun, MD;  Location: [facility] ORS;  Service: General;  Laterality: Right;  . PORTA CATH INSERTION N/A 03/14/2020   Procedure: PORTA CATH INSERTION;  Surgeon: Algernon Huxley, MD;  Location: [facility];  Service: Cardiovascular;  Laterality: N/A;    Based on our high risk symptom management report;  this patient has a high risk of ED utilization.  The percentage below indicates how "at risk "  this patient based on the factors in this table within one year.  \ General Risk Score: 2  Values used to calculate this score:   Points  Metrics       0        Age: 38      1        Hospital Admissions: 1      0        ED Visits: 0      1        Has Chronic Obstructive Pulmonary Disease: Yes      0        Has Diabetes: No      0        Has Congestive Heart Failure: No      0        Has liver disease: No      0        Has Depression: No      0        Current PCP: Kerri Perches, PA-C      0        Has Medicaid: No    3. We discussed that social determinants of health may have significant impacts on health and outcomes for cancer patients.  Today we discussed specific social determinants of performance status, alcohol use, depression, financial needs, food insecurity, housing, interpersonal violence, social connections, stress, tobacco use, and transportation.    After lengthy discussion the following were identified as areas of need:  None at this time  Outpatient services: We discussed options including home based and outpatient services, DME and care program. We discusssed that patients who participate in regular physical activity report fewer negative impacts of cancer and treatments and report less fatigue.   Financial Concerns: We discussed that living with cancer can create tremendous financial burden.  We discussed options for assistance. I asked that if assistance is needed in affording medications or paying bills to please let us know so that we can provide assistance. We discussed options for food including social services, [employer] ($50 every 2 weeks) and onsite food pantry.  We will also notify [facility] to see if cancer center can provide additional support.  Referral to Social work: Introduced Education officer, museum Elease Etienne and the services he can provide such as support with [employer], cell phone and gas vouchers.   Support groups: We discussed options for support groups at the cancer center. If interested, please notify nurse navigator to enroll. We discussed options for managing stress including healthy eating,  exercise as well as participating in no charge counseling services at the cancer center and support groups.  If these are of interest, patient can notify either myself or primary nursing team.We discussed options for management including medications and referral to quit Smart program  Transportation: We discussed options for transportation including acta, paratransit, bus routes, link transit, taxi/uber/lyft, and cancer center [place].  I have notified primary oncology team who will help assist with arranging Lucianne Lei transportation for appointments when/if needed. We also discussed options for transportation on short notice/acute visits.  Palliative care services: We have palliative care services available in the cancer center to discuss goals of care and advanced care planning.  Please let us know if you have any questions or would like to speak to our palliative nurse practitioner.  Symptom Management  Clinic: We discussed our symptom management clinic which is available for acute concerns while receiving treatment such as nausea, vomiting or diarrhea.  We can be reached via telephone at [phone] or through my chart.  We are available for virtual or in person visits on the same day from 830 to 4 PM Monday through Friday. He denies needing specific assistance at this time and He will be followed by Dr. Elroy Channel clinical team.  Plan: Discussed symptom management clinic. Discussed palliative care services. Discussed resources that are available here at the cancer center. Discussed medications and new prescriptions to begin treatment such as anti-nausea or steroids.   Disposition: RTC on 03/15/2020 for simulation to begin radiation. RTC on 03/19/2020 for lab work, MD assessment and to begin chemotherapy.  Visit Diagnosis 1. 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 He also understands that He can call clinic at any time with any  questions, concerns, or complaints.   Greater than 50% was spent in counseling and coordination of care with this patient including but not limited to discussion of the relevant topics above (See A&P) including, but not limited to diagnosis and management of acute and chronic medical conditions.   [facility] [place]  CC: Dr. Janese Banks

## 2020-03-19 ENCOUNTER — Other Ambulatory Visit: Payer: Self-pay

## 2020-03-19 ENCOUNTER — Inpatient Hospital Stay: Payer: Medicare Other

## 2020-03-19 ENCOUNTER — Ambulatory Visit
Admission: RE | Admit: 2020-03-19 | Discharge: 2020-03-19 | Disposition: A | Discharge status: Home / Self Care | Payer: Medicare Other | Source: Ambulatory Visit | Attending: Radiation Oncology | Admitting: Radiation Oncology

## 2020-03-19 ENCOUNTER — Inpatient Hospital Stay (HOSPITAL_BASED_OUTPATIENT_CLINIC_OR_DEPARTMENT_OTHER): Payer: Medicare Other | Admitting: Oncology

## 2020-03-19 ENCOUNTER — Encounter: Payer: Self-pay | Admitting: Oncology

## 2020-03-19 VITALS — BP 120/70 | HR 74 | Temp 96.3°F | Resp 16 | Wt 162.2 lb

## 2020-03-19 DIAGNOSIS — C109 Malignant neoplasm of oropharynx, unspecified: Secondary | ICD-10-CM

## 2020-03-19 DIAGNOSIS — Z510 Encounter for antineoplastic radiation therapy: Secondary | ICD-10-CM | POA: Diagnosis not present

## 2020-03-19 DIAGNOSIS — Z5111 Encounter for antineoplastic chemotherapy: Secondary | ICD-10-CM | POA: Diagnosis not present

## 2020-03-19 DIAGNOSIS — Z95828 Presence of other vascular implants and grafts: Secondary | ICD-10-CM

## 2020-03-19 LAB — HEPATIC FUNCTION PANEL
ALT: 39 U/L (ref 0–44)
AST: 36 U/L (ref 15–41)
Albumin: 4 g/dL (ref 3.5–5.0)
Alkaline Phosphatase: 74 U/L (ref 38–126)
Bilirubin, Direct: 0.1 mg/dL (ref 0.0–0.2)
Indirect Bilirubin: 0.5 mg/dL (ref 0.3–0.9)
Total Bilirubin: 0.6 mg/dL (ref 0.3–1.2)
Total Protein: 7.5 g/dL (ref 6.5–8.1)

## 2020-03-19 LAB — CBC WITH DIFFERENTIAL/PLATELET
Abs Immature Granulocytes: 0.03 10*3/uL (ref 0.00–0.07)
Basophils Absolute: 0.1 10*3/uL (ref 0.0–0.1)
Basophils Relative: 1 %
Eosinophils Absolute: 0.4 10*3/uL (ref 0.0–0.5)
Eosinophils Relative: 4 %
HCT: 43.3 % (ref 39.0–52.0)
Hemoglobin: 15.5 g/dL (ref 13.0–17.0)
Immature Granulocytes: 0 %
Lymphocytes Relative: 35 %
Lymphs Abs: 3.7 10*3/uL (ref 0.7–4.0)
MCH: 34.7 pg — ABNORMAL HIGH (ref 26.0–34.0)
MCHC: 35.8 g/dL (ref 30.0–36.0)
MCV: 96.9 fL (ref 80.0–100.0)
Monocytes Absolute: 1.2 10*3/uL — ABNORMAL HIGH (ref 0.1–1.0)
Monocytes Relative: 11 %
Neutro Abs: 5.2 10*3/uL (ref 1.7–7.7)
Neutrophils Relative %: 49 %
Platelets: 331 10*3/uL (ref 150–400)
RBC: 4.47 MIL/uL (ref 4.22–5.81)
RDW: 12.6 % (ref 11.5–15.5)
WBC: 10.6 10*3/uL — ABNORMAL HIGH (ref 4.0–10.5)
nRBC: 0 % (ref 0.0–0.2)

## 2020-03-19 LAB — BASIC METABOLIC PANEL
Anion gap: 7 (ref 5–15)
BUN: 19 mg/dL (ref 8–23)
CO2: 30 mmol/L (ref 22–32)
Calcium: 8.9 mg/dL (ref 8.9–10.3)
Chloride: 101 mmol/L (ref 98–111)
Creatinine, Ser: 0.79 mg/dL (ref 0.61–1.24)
GFR calc Af Amer: >60 mL/min (ref >60)
GFR calc non Af Amer: >60 mL/min (ref >60)
Glucose, Bld: 118 mg/dL — ABNORMAL HIGH (ref 70–99)
Potassium: 3.8 mmol/L (ref 3.5–5.1)
Sodium: 138 mmol/L (ref 135–145)

## 2020-03-19 LAB — MAGNESIUM: Magnesium: 2.2 mg/dL (ref 1.7–2.4)

## 2020-03-19 MED ORDER — SODIUM CHLORIDE 0.9 % IV SOLN
Freq: Once | INTRAVENOUS | Status: AC
  Filled 2020-03-19: qty 250

## 2020-03-19 MED ORDER — SODIUM CHLORIDE 0.9 % IV SOLN
10.0000 mg | Freq: Once | INTRAVENOUS | Status: AC
  Administered 2020-03-19: 10 mg via INTRAVENOUS
  Filled 2020-03-19: qty 1

## 2020-03-19 MED ORDER — HEPARIN SOD (PORK) LOCK FLUSH 100 UNIT/ML IV SOLN
500.0000 [IU] | Freq: Once | INTRAVENOUS | Status: AC
  Administered 2020-03-19: 500 [IU] via INTRAVENOUS
  Filled 2020-03-19: qty 5

## 2020-03-19 MED ORDER — SODIUM CHLORIDE 0.9 % IV SOLN
Freq: Once | INTRAVENOUS | Status: AC
  Filled 2020-03-19: qty 10

## 2020-03-19 MED ORDER — SODIUM CHLORIDE 0.9 % IV SOLN
150.0000 mg | Freq: Once | INTRAVENOUS | Status: AC
  Administered 2020-03-19: 150 mg via INTRAVENOUS
  Filled 2020-03-19: qty 150

## 2020-03-19 MED ORDER — SODIUM CHLORIDE 0.9% FLUSH
10.0000 mL | Freq: Once | INTRAVENOUS | Status: AC
  Administered 2020-03-19: 10 mL via INTRAVENOUS
  Filled 2020-03-19: qty 10

## 2020-03-19 MED ORDER — SODIUM CHLORIDE 0.9 % IV SOLN
40.0000 mg/m2 | Freq: Once | INTRAVENOUS | Status: AC
  Administered 2020-03-19: 75 mg via INTRAVENOUS
  Filled 2020-03-19: qty 75

## 2020-03-19 MED ORDER — HEPARIN SOD (PORK) LOCK FLUSH 100 UNIT/ML IV SOLN
INTRAVENOUS | Status: AC
  Filled 2020-03-19: qty 5

## 2020-03-19 MED ORDER — PALONOSETRON HCL INJECTION 0.25 MG/5ML
0.2500 mg | Freq: Once | INTRAVENOUS | Status: AC
  Administered 2020-03-19: 0.25 mg via INTRAVENOUS
  Filled 2020-03-19: qty 5

## 2020-03-20 ENCOUNTER — Ambulatory Visit
Admission: RE | Admit: 2020-03-20 | Discharge: 2020-03-20 | Disposition: A | Discharge status: Home / Self Care | Payer: Medicare Other | Source: Ambulatory Visit | Attending: Radiation Oncology | Admitting: Radiation Oncology

## 2020-03-20 ENCOUNTER — Telehealth: Payer: Self-pay

## 2020-03-20 DIAGNOSIS — Z510 Encounter for antineoplastic radiation therapy: Secondary | ICD-10-CM | POA: Diagnosis not present

## 2020-03-20 NOTE — Progress Notes (Signed)
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  CT soft tissue neck showed a hyperenhancement of left palatine tonsil measuring 2.8 cm.  Enlarged left level 2A lymph node measuring 3.4 cm in long axis and a smaller but conspicuous level 2B lymph node measuring 9 mm.  PET CT scan showed marked hypermetabolism in the left tonsillar region with an SUV of 8.8.  SUV 4.2 at the level 2 lymph node.  The smaller level 2B lymph node is superimposed on the dominant necrotic node.  4.8 x 4.4 cm abdominal aortic aneurysm.  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  Patient was seen by radiation oncology Dr. Donella Stade and plan is for concurrent chemoradiation.    Interval history-patient reports he is able to eat without any significant difficulty.  He had a bacon biscuit for breakfast today.  He has not been using any Tylenol or other pain medications.  ECOG PS- 1 Pain scale- 0 Opioid associated constipation- no  Review of systems- Review of Systems  Constitutional: Negative for chills, fever, malaise/fatigue and weight loss.  HENT: Negative for congestion, ear discharge and nosebleeds.   Eyes: Negative for blurred vision.  Respiratory: Negative for cough, hemoptysis, sputum production, shortness of breath and wheezing.   Cardiovascular: Negative for chest pain, palpitations, orthopnea and claudication.    Gastrointestinal: Negative for abdominal pain, blood in stool, constipation, diarrhea, heartburn, melena, nausea and vomiting.  Genitourinary: Negative for dysuria, flank pain, frequency, hematuria and urgency.  Musculoskeletal: Negative for back pain, joint pain and myalgias.  Skin: Negative for rash.  Neurological: Negative for dizziness, tingling, focal weakness, seizures, weakness and headaches.  Endo/Heme/Allergies: Does not bruise/bleed easily.  Psychiatric/Behavioral: Negative for depression and suicidal ideas. The patient does not have insomnia.       No Known Allergies   Past Medical History:  Diagnosis Date  . Arthritis   . Asthma   . Brain bleed ([place])    [place] years ago after falling off ladder  . COPD (chronic obstructive pulmonary disease) ([place])   . Diverticulitis   . Hypertension   . Tonsil cancer [facility])      Past Surgical History:  Procedure Laterality Date  . APPENDECTOMY    . FRACTURE SURGERY Left    ORIF left forearm  . HERNIA REPAIR Left    inguinal  . HERNIA REPAIR     abd  . INGUINAL HERNIA REPAIR Right 09/06/2018   Procedure: HERNIA REPAIR INGUINAL ADULT, RIGHT;  Surgeon: Herbert Pun, MD;  Location: [facility] ORS;  Service: General;  Laterality: Right;  . PORTA CATH INSERTION N/A 03/14/2020   Procedure: PORTA CATH INSERTION;  Surgeon: Algernon Huxley, MD;  Location: [facility];  Service: Cardiovascular;  Laterality: N/A;    Social History   Socioeconomic History  . Marital status: Significant Other    Spouse name: Not on file  . Number of children: Not on file  . Years of education: Not on file  .  Highest education level: Not on file  Occupational History  . Not on file  Tobacco Use  . Smoking status: Current Every Day Smoker    Packs/day: 0.50    Years: 30.00    Pack years: 15.00    Types: Cigarettes  . Smokeless tobacco: Never Used  Vaping Use  . Vaping Use: Never used  Substance and Sexual Activity  . Alcohol use: Yes     Alcohol/week: 12.0 standard drinks    Types: 12 Cans of beer per week  . Drug use: Yes    Types: Marijuana  . Sexual activity: Not on file  Other Topics Concern  . Not on file  Social History Narrative  . Not on file   Social Determinants of Health   Financial Resource Strain:   . Difficulty of Paying Living Expenses: Not on file  Food Insecurity:   . Worried About Charity fundraiser in the Last Year: Not on file  . Ran Out of Food in the Last Year: Not on file  Transportation Needs:   . Lack of Transportation (Medical): Not on file  . Lack of Transportation (Non-Medical): Not on file  Physical Activity:   . Days of Exercise per Week: Not on file  . Minutes of Exercise per Session: Not on file  Stress:   . Feeling of Stress : Not on file  Social Connections:   . Frequency of Communication with Friends and Family: Not on file  . Frequency of Social Gatherings with Friends and Family: Not on file  . Attends Religious Services: Not on file  . Active Member of Clubs or Organizations: Not on file  . Attends Archivist Meetings: Not on file  . Marital Status: Not on file  Intimate Partner Violence:   . Fear of Current or Ex-Partner: Not on file  . Emotionally Abused: Not on file  . Physically Abused: Not on file  . Sexually Abused: Not on file    No family history on file.   Current Outpatient Medications:  .  gabapentin (NEURONTIN) 300 MG capsule, Take 300 mg by mouth 2 (two) times daily. , Disp: , Rfl:  .  lidocaine-prilocaine (EMLA) cream, Apply to affected area once, Disp: 30 g, Rfl: 3 .  lisinopril (PRINIVIL,ZESTRIL) 40 MG tablet, Take 40 mg by mouth daily. , Disp: , Rfl:  .  dexamethasone (DECADRON) 4 MG tablet, Take 2 tablets (8 mg total) by mouth daily. Take daily x 3 days starting the day after cisplatin chemotherapy. Take with food. (Patient not taking: Reported on 03/14/2020), Disp: 30 tablet, Rfl: 1 .  LORazepam (ATIVAN) 0.5 MG tablet, Take 1 tablet  (0.5 mg total) by mouth every 6 (six) hours as needed (Nausea or vomiting). (Patient not taking: Reported on 03/14/2020), Disp: 30 tablet, Rfl: 0 .  ondansetron (ZOFRAN) 8 MG tablet, Take 1 tablet (8 mg total) by mouth 2 (two) times daily as needed. Start on the third day after cisplatin chemotherapy. (Patient not taking: Reported on 03/14/2020), Disp: 30 tablet, Rfl: 1 .  prochlorperazine (COMPAZINE) 10 MG tablet, Take 1 tablet (10 mg total) by mouth every 6 (six) hours as needed (Nausea or vomiting). (Patient not taking: Reported on 03/14/2020), Disp: 30 tablet, Rfl: 1 .  tiotropium (SPIRIVA) 18 MCG inhalation capsule, Place 18 mcg into inhaler and inhale daily. (Patient not taking: Reported on 03/19/2020), Disp: , Rfl:   Physical exam:  Vitals:   03/19/20 0910  BP: 120/70  Pulse: 74  Resp:  16  Temp: (!) 96.3 F (35.7 C)  TempSrc: Tympanic  SpO2: 97%  Weight: 162 lb 3.2 oz (73.6 kg)   Physical Exam Constitutional:      General: He is not in acute distress. Cardiovascular:     Rate and Rhythm: Normal rate and regular rhythm.     Heart sounds: Normal heart sounds.  Pulmonary:     Effort: Pulmonary effort is normal.     Breath sounds: Normal breath sounds.  Abdominal:     General: Bowel sounds are normal.     Palpations: Abdomen is soft.  Lymphadenopathy:     Comments: Palpable left cervical lymph node  Skin:    General: Skin is warm and dry.  Neurological:     Mental Status: He is alert and oriented to person, place, and time.      CMP Latest Ref Rng & Units 03/19/2020  Glucose 70 - 99 mg/dL 118(H)  BUN 8 - 23 mg/dL 19  Creatinine 0.61 - 1.24 mg/dL 0.79  Sodium 135 - 145 mmol/L 138  Potassium 3.5 - 5.1 mmol/L 3.8  Chloride 98 - 111 mmol/L 101  CO2 22 - 32 mmol/L 30  Calcium 8.9 - 10.3 mg/dL 8.9  Total Protein 6.5 - 8.1 g/dL 7.5  Total Bilirubin 0.3 - 1.2 mg/dL 0.6  Alkaline Phos 38 - 126 U/L 74  AST 15 - 41 U/L 36  ALT 0 - 44 U/L 39   CBC Latest Ref Rng & Units  03/19/2020  WBC 4.0 - 10.5 K/uL 10.6(H)  Hemoglobin 13.0 - 17.0 g/dL 15.5  Hematocrit 39 - 52 % 43.3  Platelets 150 - 400 K/uL 331    No images are attached to the encounter.  PERIPHERAL VASCULAR CATHETERIZATION  Result Date: 03/14/2020 See op note  NM PET Image Initial (PI) Skull Base To Thigh  Result Date: 02/29/2020 CLINICAL DATA:  Initial treatment strategy for head neck cancer. EXAM: NUCLEAR MEDICINE PET SKULL BASE TO THIGH TECHNIQUE: 8.4 mCi F-18 FDG was injected intravenously. Full-ring PET imaging was performed from the skull base to thigh after the radiotracer. CT data was obtained and used for attenuation correction and anatomic localization. Fasting blood glucose: 118 mg/dl COMPARISON:  Neck CT 02/13/2020 FINDINGS: Mediastinal blood pool activity: SUV max 1.7 Liver activity: SUV max NA NECK: Marked hypermetabolism is identified in the left tonsillar region with SUV max = 8.8. Left-sided necrotic level II lymph node is hypermetabolic with SUV max = 4.2. The smaller level II B lymph node seen on recent diagnostic neck CT is superimposed on activity from the dominant necrotic left cervical node. Incidental CT findings: none CHEST: No hypermetabolic mediastinal or hilar nodes. No suspicious pulmonary nodules on the CT scan. Incidental CT findings: Coronary artery calcification is evident. Atherosclerotic calcification is noted in the wall of the thoracic aorta. No suspicious pulmonary nodule or mass. Tiny nodule in the right lower lobe (107/3) is stable since lung cancer screening CT of 11/17/2019, characterized as benign. No focal airspace consolidation. There is no evidence of pleural effusion. ABDOMEN/PELVIS: No abnormal hypermetabolic activity within the liver, pancreas, adrenal glands, or spleen. No hypermetabolic lymph nodes in the abdomen or pelvis. Incidental CT findings: 4.8 x 4.4 cm abdominal aortic aneurysm. Diverticular changes noted left colon. SKELETON: No focal hypermetabolic  activity to suggest skeletal metastasis. Incidental CT findings: none IMPRESSION: 1. Marked hypermetabolism in the left tonsillar region, compatible with known primary neoplasm. 2. Hypermetabolism associated with the metastatic left cervical lymphadenopathy as characterized on recent  diagnostic CT neck. 3. No evidence for hypermetabolic metastatic disease in the chest, abdomen, or pelvis. 4. 4.8 cm abdominal aortic aneurysm. Recommend follow-up every 6 months and vascular consultation. This recommendation follows ACR consensus guidelines: White Paper of the ACR Incidental Findings Committee II on Vascular Findings. J Am Coll Radiol 2013; 03:709-643. 5.  Aortic Atherosclerois (ICD10-170.0) Electronically Signed   By: Misty Stanley M.D.   On: 02/29/2020 16:28     Assessment and plan- Patient is a 64 y.o. 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  He is here for on treatment assessment prior to cycle 1 of weekly cisplatin chemotherapy  Counts okay to proceed with cycle 1 of weekly cisplatin chemotherapy today.  He will be getting pre and post cisplatin fluids.  I have encouraged him to keep up with his oral intake of solid food as well as fluids on a regular basis.  I will see him back in 1 week for cycle 2.  Treatment is being given with a curative intent  Patient noted to have abnormal LFTs last week which was repeated this week and is normal.  Patient has a port in place which was put in by Dr. Lucky Cowboy.  I will refer him back to Dr. Lucky Cowboy in the future for management of his AAA as well  Visit Diagnosis 1. Squamous cell carcinoma of oropharynx ([place])   2. Encounter for antineoplastic chemotherapy      Dr. Randa Evens, MD, MPH [facility] [phone] 03/20/2020 8:47 AM

## 2020-03-20 NOTE — Telephone Encounter (Signed)
Telephone call to patient for follow up after receiving first infusion.   Patient states infusion went great.  States eating good and drinking plenty of fluids.   Denies any nausea or vomiting. Pt did say he was having problem with constipation.   Encouraged pt to get Mira-lax and take one capful twice a day.  Encouraged patient to call for any concerns or questions.

## 2020-03-21 ENCOUNTER — Ambulatory Visit
Admission: RE | Admit: 2020-03-21 | Discharge: 2020-03-21 | Disposition: A | Discharge status: Home / Self Care | Payer: Medicare Other | Source: Ambulatory Visit | Attending: Radiation Oncology | Admitting: Radiation Oncology

## 2020-03-21 DIAGNOSIS — Z510 Encounter for antineoplastic radiation therapy: Secondary | ICD-10-CM | POA: Diagnosis not present

## 2020-03-22 ENCOUNTER — Ambulatory Visit
Admission: RE | Admit: 2020-03-22 | Discharge: 2020-03-22 | Disposition: A | Discharge status: Home / Self Care | Payer: Medicare Other | Source: Ambulatory Visit | Attending: Radiation Oncology | Admitting: Radiation Oncology

## 2020-03-22 ENCOUNTER — Telehealth: Payer: Self-pay

## 2020-03-22 DIAGNOSIS — Z510 Encounter for antineoplastic radiation therapy: Secondary | ICD-10-CM | POA: Diagnosis not present

## 2020-03-22 NOTE — Telephone Encounter (Signed)
Nutrition Follow-up:  Patient with tonsil cancer stage IV, HPV+.    Called patient for nutrition follow-up.  Patient reports that he is eating well and has gained weight.  Reports that he ate T-bone steak with green beans and stewed corn for dinner last night.  Ate Big Mac and fries for lunch today.  Drank equate for breakfast this am.  Tries to drink 3 shakes per day.  Reports that he is staying hydrated.  Reports fried foods make him feel a little sick.     Medications: zofran, ativan, compazine  Labs: reviewed  Anthropometrics:   Weight 162 lb 3.2 oz on 9/27 increased from 156 lb (9/10)   NUTRITION DIAGNOSIS: Inadequate oral intake improved   INTERVENTION:  Encouraged patient to continue high calorie, high protein foods and oral nutrition supplements as able.  Discussed fried, greasy foods can aggravate upset stomach.       MONITORING, EVALUATION, GOAL: weight trends, intake   NEXT VISIT: October 14 phone f/u  Ronnie Ingram, [place], [place] Registered Dietitian [phone] (mobile)

## 2020-03-23 ENCOUNTER — Ambulatory Visit
Admission: RE | Admit: 2020-03-23 | Discharge: 2020-03-23 | Disposition: A | Discharge status: Home / Self Care | Payer: Medicare Other | Source: Ambulatory Visit | Attending: Radiation Oncology | Admitting: Radiation Oncology

## 2020-03-23 DIAGNOSIS — Z510 Encounter for antineoplastic radiation therapy: Secondary | ICD-10-CM | POA: Insufficient documentation

## 2020-03-23 DIAGNOSIS — C099 Malignant neoplasm of tonsil, unspecified: Secondary | ICD-10-CM | POA: Insufficient documentation

## 2020-03-26 ENCOUNTER — Inpatient Hospital Stay: Payer: Medicare Other | Attending: Oncology

## 2020-03-26 ENCOUNTER — Inpatient Hospital Stay (HOSPITAL_BASED_OUTPATIENT_CLINIC_OR_DEPARTMENT_OTHER): Payer: Medicare Other | Admitting: Oncology

## 2020-03-26 ENCOUNTER — Encounter: Payer: Self-pay | Admitting: Oncology

## 2020-03-26 ENCOUNTER — Other Ambulatory Visit: Payer: Self-pay

## 2020-03-26 ENCOUNTER — Other Ambulatory Visit: Payer: Self-pay | Admitting: *Deleted

## 2020-03-26 ENCOUNTER — Ambulatory Visit
Admission: RE | Admit: 2020-03-26 | Discharge: 2020-03-26 | Disposition: A | Discharge status: Home / Self Care | Payer: Medicare Other | Source: Ambulatory Visit | Attending: Radiation Oncology | Admitting: Radiation Oncology

## 2020-03-26 ENCOUNTER — Inpatient Hospital Stay: Payer: Medicare Other

## 2020-03-26 VITALS — BP 120/70 | HR 72 | Temp 98.5°F | Resp 16 | Ht 70.0 in | Wt 164.1 lb

## 2020-03-26 DIAGNOSIS — F17210 Nicotine dependence, cigarettes, uncomplicated: Secondary | ICD-10-CM | POA: Diagnosis not present

## 2020-03-26 DIAGNOSIS — Z5111 Encounter for antineoplastic chemotherapy: Secondary | ICD-10-CM

## 2020-03-26 DIAGNOSIS — Z510 Encounter for antineoplastic radiation therapy: Secondary | ICD-10-CM | POA: Diagnosis not present

## 2020-03-26 DIAGNOSIS — C109 Malignant neoplasm of oropharynx, unspecified: Secondary | ICD-10-CM | POA: Insufficient documentation

## 2020-03-26 DIAGNOSIS — K1230 Oral mucositis (ulcerative), unspecified: Secondary | ICD-10-CM | POA: Diagnosis not present

## 2020-03-26 LAB — BASIC METABOLIC PANEL
Anion gap: 9 (ref 5–15)
BUN: 30 mg/dL — ABNORMAL HIGH (ref 8–23)
CO2: 31 mmol/L (ref 22–32)
Calcium: 9.1 mg/dL (ref 8.9–10.3)
Chloride: 99 mmol/L (ref 98–111)
Creatinine, Ser: 0.86 mg/dL (ref 0.61–1.24)
GFR calc Af Amer: >60 mL/min (ref >60)
GFR calc non Af Amer: >60 mL/min (ref >60)
Glucose, Bld: 123 mg/dL — ABNORMAL HIGH (ref 70–99)
Potassium: 3.9 mmol/L (ref 3.5–5.1)
Sodium: 139 mmol/L (ref 135–145)

## 2020-03-26 LAB — CBC WITH DIFFERENTIAL/PLATELET
Abs Immature Granulocytes: 0.13 10*3/uL — ABNORMAL HIGH (ref 0.00–0.07)
Basophils Absolute: 0 10*3/uL (ref 0.0–0.1)
Basophils Relative: 0 %
Eosinophils Absolute: 0.2 10*3/uL (ref 0.0–0.5)
Eosinophils Relative: 1 %
HCT: 40.2 % (ref 39.0–52.0)
Hemoglobin: 14.6 g/dL (ref 13.0–17.0)
Immature Granulocytes: 1 %
Lymphocytes Relative: 30 %
Lymphs Abs: 4.4 10*3/uL — ABNORMAL HIGH (ref 0.7–4.0)
MCH: 34.8 pg — ABNORMAL HIGH (ref 26.0–34.0)
MCHC: 36.3 g/dL — ABNORMAL HIGH (ref 30.0–36.0)
MCV: 95.9 fL (ref 80.0–100.0)
Monocytes Absolute: 1.2 10*3/uL — ABNORMAL HIGH (ref 0.1–1.0)
Monocytes Relative: 8 %
Neutro Abs: 8.5 10*3/uL — ABNORMAL HIGH (ref 1.7–7.7)
Neutrophils Relative %: 60 %
Platelets: 334 10*3/uL (ref 150–400)
RBC: 4.19 MIL/uL — ABNORMAL LOW (ref 4.22–5.81)
RDW: 12.7 % (ref 11.5–15.5)
WBC: 14.4 10*3/uL — ABNORMAL HIGH (ref 4.0–10.5)
nRBC: 0 % (ref 0.0–0.2)

## 2020-03-26 LAB — MAGNESIUM: Magnesium: 2.1 mg/dL (ref 1.7–2.4)

## 2020-03-26 MED ORDER — HEPARIN SOD (PORK) LOCK FLUSH 100 UNIT/ML IV SOLN
INTRAVENOUS | Status: AC
  Filled 2020-03-26: qty 5

## 2020-03-26 MED ORDER — MAGIC MOUTHWASH W/LIDOCAINE: 5.0000 mL | Freq: Four times a day (QID) | ORAL | 1 refills | Status: DC | PRN

## 2020-03-26 MED ORDER — SODIUM CHLORIDE 0.9 % IV SOLN
10.0000 mg | Freq: Once | INTRAVENOUS | Status: AC
  Administered 2020-03-26: 10 mg via INTRAVENOUS
  Filled 2020-03-26: qty 10

## 2020-03-26 MED ORDER — SODIUM CHLORIDE 0.9 % IV SOLN
40.0000 mg/m2 | Freq: Once | INTRAVENOUS | Status: AC
  Administered 2020-03-26: 75 mg via INTRAVENOUS
  Filled 2020-03-26: qty 75

## 2020-03-26 MED ORDER — SODIUM CHLORIDE 0.9 % IV SOLN
150.0000 mg | Freq: Once | INTRAVENOUS | Status: AC
  Administered 2020-03-26: 150 mg via INTRAVENOUS
  Filled 2020-03-26: qty 150

## 2020-03-26 MED ORDER — HEPARIN SOD (PORK) LOCK FLUSH 100 UNIT/ML IV SOLN
500.0000 [IU] | Freq: Once | INTRAVENOUS | Status: AC | PRN
  Administered 2020-03-26: 500 [IU]
  Filled 2020-03-26: qty 5

## 2020-03-26 MED ORDER — SODIUM CHLORIDE 0.9 % IV SOLN
Freq: Once | INTRAVENOUS | Status: AC
  Filled 2020-03-26: qty 1000

## 2020-03-26 MED ORDER — SODIUM CHLORIDE 0.9 % IV SOLN
Freq: Once | INTRAVENOUS | Status: AC
  Filled 2020-03-26: qty 250

## 2020-03-26 MED ORDER — SODIUM CHLORIDE 0.9% FLUSH
10.0000 mL | Freq: Once | INTRAVENOUS | Status: AC
  Administered 2020-03-26: 10 mL via INTRAVENOUS
  Filled 2020-03-26: qty 10

## 2020-03-26 MED ORDER — PALONOSETRON HCL INJECTION 0.25 MG/5ML
0.2500 mg | Freq: Once | INTRAVENOUS | Status: AC
  Administered 2020-03-26: 0.25 mg via INTRAVENOUS
  Filled 2020-03-26: qty 5

## 2020-03-26 NOTE — Progress Notes (Signed)
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 CT soft tissue neck showed a hyperenhancement of left palatine tonsil measuring 2.8 cm. Enlarged left level 2A lymph node measuring 3.4 cm in long axis and a smaller but conspicuous level 2B lymph node measuring 9 mm. PET CT scan showed marked hypermetabolism in the left tonsillar region with an SUV of 8.8. SUV 4.2 at the level 2 lymph node. The smaller level 2B lymph node is superimposed on the dominant necrotic node. 4.8 x 4.4 cm abdominal aortic aneurysm. 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 Baruch Gouty and plan is for concurrent chemoradiation.    Interval history-patient reports having no significant side effects from chemotherapy so far.  He is able to swallow without any significant difficulty or pain.  He is able to eat solid foods.  Weight has remained stable.  He is keeping up with his oral hydration.  ECOG PS- 1 Pain scale- 0   Review of systems- Review of Systems  Constitutional: Negative for chills, fever, malaise/fatigue and weight loss.  HENT: Negative for congestion, ear discharge and nosebleeds.   Eyes: Negative for blurred vision.  Respiratory: Negative for cough, hemoptysis, sputum production, shortness of breath and wheezing.   Cardiovascular: Negative for chest pain, palpitations,  orthopnea and claudication.  Gastrointestinal: Negative for abdominal pain, blood in stool, constipation, diarrhea, heartburn, melena, nausea and vomiting.  Genitourinary: Negative for dysuria, flank pain, frequency, hematuria and urgency.  Musculoskeletal: Negative for back pain, joint pain and myalgias.  Skin: Negative for rash.  Neurological: Negative for dizziness, tingling, focal weakness, seizures, weakness and headaches.  Endo/Heme/Allergies: Does not bruise/bleed easily.  Psychiatric/Behavioral: Negative for depression and suicidal ideas. The patient does not have insomnia.       No Known Allergies   Past Medical History:  Diagnosis Date  . Arthritis   . Asthma   . Brain bleed ([place])    severak years ago after falling off ladder  . COPD (chronic obstructive pulmonary disease) ([place])   . Diverticulitis   . Hypertension   . 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 APPENDECTOMY    . FRACTURE SURGERY Left    ORIF left forearm  . HERNIA REPAIR Left    inguinal  . HERNIA REPAIR     abd  . INGUINAL HERNIA REPAIR Right 09/06/2018   Procedure: HERNIA REPAIR INGUINAL ADULT, RIGHT;  Surgeon: Herbert Pun, MD;  Location: [facility] ORS;  Service: General;  Laterality: Right;  . PORTA CATH INSERTION N/A 03/14/2020   Procedure: PORTA CATH INSERTION;  Surgeon: Algernon Huxley, MD;  Location: [place] [facility];  Service: Cardiovascular;  Laterality: N/A;    Social History   Socioeconomic History  . Marital status: Significant Other    Spouse name: Not on file  . Number of children: Not on file  . Years of education: Not on file  .  Highest education level: Not on file  Occupational History  . Not on file  Tobacco Use  . Smoking status: Current Every Day Smoker    Packs/day: 0.50    Years: 30.00    Pack years: 15.00    Types: Cigarettes  . Smokeless tobacco: Never Used  Vaping Use  . Vaping Use: Never used  Substance and Sexual  Activity  . Alcohol use: Yes    Alcohol/week: 12.0 standard drinks    Types: 12 Cans of beer per week  . Drug use: Yes    Types: Marijuana  . Sexual activity: Not on file  Other Topics Concern  . Not on file  Social History Narrative  . Not on file   Social Determinants of Health   Financial Resource Strain:   . Difficulty of Paying Living Expenses: Not on file  Food Insecurity:   . Worried About Charity fundraiser in the Last Year: Not on file  . Ran Out of Food in the Last Year: Not on file  Transportation Needs:   . Lack of Transportation (Medical): Not on file  . Lack of Transportation (Non-Medical): Not on file  Physical Activity:   . Days of Exercise per Week: Not on file  . Minutes of Exercise per Session: Not on file  Stress:   . Feeling of Stress : Not on file  Social Connections:   . Frequency of Communication with Friends and Family: Not on file  . Frequency of Social Gatherings with Friends and Family: Not on file  . Attends Religious Services: Not on file  . Active Member of Clubs or Organizations: Not on file  . Attends Archivist Meetings: Not on file  . Marital Status: Not on file  Intimate Partner Violence:   . Fear of Current or Ex-Partner: Not on file  . Emotionally Abused: Not on file  . Physically Abused: Not on file  . Sexually Abused: Not on file    No family history on file.   Current Outpatient Medications:  .  dexamethasone (DECADRON) 4 MG tablet, Take 2 tablets (8 mg total) by mouth daily. Take daily x 3 days starting the day after cisplatin chemotherapy. Take with food., Disp: 30 tablet, Rfl: 1 .  gabapentin (NEURONTIN) 300 MG capsule, Take 300 mg by mouth 2 (two) times daily. , Disp: , Rfl:  .  lidocaine-prilocaine (EMLA) cream, Apply to affected area once, Disp: 30 g, Rfl: 3 .  lisinopril (PRINIVIL,ZESTRIL) 40 MG tablet, Take 40 mg by mouth daily. , Disp: , Rfl:  .  tiotropium (SPIRIVA) 18 MCG inhalation capsule, Place 18 mcg  into inhaler and inhale daily. , Disp: , Rfl:  .  LORazepam (ATIVAN) 0.5 MG tablet, Take 1 tablet (0.5 mg total) by mouth every 6 (six) hours as needed (Nausea or vomiting). (Patient not taking: Reported on 03/14/2020), Disp: 30 tablet, Rfl: 0 .  ondansetron (ZOFRAN) 8 MG tablet, Take 1 tablet (8 mg total) by mouth 2 (two) times daily as needed. Start on the third day after cisplatin chemotherapy. (Patient not taking: Reported on 03/14/2020), Disp: 30 tablet, Rfl: 1 .  prochlorperazine (COMPAZINE) 10 MG tablet, Take 1 tablet (10 mg total) by mouth every 6 (six) hours as needed (Nausea or vomiting). (Patient not taking: Reported on 03/14/2020), Disp: 30 tablet, Rfl: 1 No current facility-administered medications for this visit.  Facility-Administered Medications Ordered in Other Visits:  .  CISplatin (PLATINOL) 75 mg in sodium chloride 0.9 % 250 mL  chemo infusion, 40 mg/m2 (Treatment Plan Recorded), Intravenous, Once, Sindy Guadeloupe, MD .  dexamethasone (DECADRON) 10 mg in sodium chloride 0.9 % 50 mL IVPB, 10 mg, Intravenous, Once, Sindy Guadeloupe, MD .  fosaprepitant (EMEND) 150 mg in sodium chloride 0.9 % 145 mL IVPB, 150 mg, Intravenous, Once, Sindy Guadeloupe, MD .  heparin lock flush 100 unit/mL, 500 Units, Intracatheter, Once PRN, Sindy Guadeloupe, MD .  palonosetron (ALOXI) injection 0.25 mg, 0.25 mg, Intravenous, Once, Sindy Guadeloupe, MD .  sodium chloride 0.9 % 1,000 mL with potassium chloride 20 mEq, magnesium sulfate 2 g infusion, , Intravenous, Once, Sindy Guadeloupe, MD  Physical exam:  Vitals:   03/26/20 0845  BP: 120/70  Pulse: 72  Resp: 16  Temp: 98.5 F (36.9 C)  TempSrc: Oral  Weight: 164 lb 1.6 oz (74.4 kg)  Height: 5\' 10"  (1.778 m)   Physical Exam HENT:     Head: Normocephalic and atraumatic.     Mouth/Throat:     Comments: Grade 1 mucositis noted Eyes:     Pupils: Pupils are equal, round, and reactive to light.  Cardiovascular:     Rate and Rhythm: Normal rate and regular  rhythm.     Heart sounds: Normal heart sounds.  Pulmonary:     Effort: Pulmonary effort is normal.     Breath sounds: Normal breath sounds.  Abdominal:     General: Bowel sounds are normal.     Palpations: Abdomen is soft.  Musculoskeletal:     Cervical back: Normal range of motion.  Lymphadenopathy:     Comments: Palpable left cervical lymph node mildly reduced in size  Skin:    General: Skin is warm and dry.  Neurological:     Mental Status: He is alert and oriented to person, place, and time.      CMP Latest Ref Rng & Units 03/26/2020  Glucose 70 - 99 mg/dL 123(H)  BUN 8 - 23 mg/dL 30(H)  Creatinine 0.61 - 1.24 mg/dL 0.86  Sodium 135 - 145 mmol/L 139  Potassium 3.5 - 5.1 mmol/L 3.9  Chloride 98 - 111 mmol/L 99  CO2 22 - 32 mmol/L 31  Calcium 8.9 - 10.3 mg/dL 9.1  Total Protein 6.5 - 8.1 g/dL -  Total Bilirubin 0.3 - 1.2 mg/dL -  Alkaline Phos 38 - 126 U/L -  AST 15 - 41 U/L -  ALT 0 - 44 U/L -   CBC Latest Ref Rng & Units 03/26/2020  WBC 4.0 - 10.5 K/uL 14.4(H)  Hemoglobin 13.0 - 17.0 g/dL 14.6  Hematocrit 39 - 52 % 40.2  Platelets 150 - 400 K/uL 334    No images are attached to the encounter.  PERIPHERAL VASCULAR CATHETERIZATION  Result Date: 03/14/2020 See op note  NM PET Image Initial (PI) Skull Base To Thigh  Result Date: 02/29/2020 CLINICAL DATA:  Initial treatment strategy for head neck cancer. EXAM: NUCLEAR MEDICINE PET SKULL BASE TO THIGH TECHNIQUE: 8.4 mCi F-18 FDG was injected intravenously. Full-ring PET imaging was performed from the skull base to thigh after the radiotracer. CT data was obtained and used for attenuation correction and anatomic localization. Fasting blood glucose: 118 mg/dl COMPARISON:  Neck CT 02/13/2020 FINDINGS: Mediastinal blood pool activity: SUV max 1.7 Liver activity: SUV max NA NECK: Marked hypermetabolism is identified in the left tonsillar region with SUV max = 8.8. Left-sided necrotic level II lymph node is hypermetabolic with  SUV max = 4.2. The smaller level  II B lymph node seen on recent diagnostic neck CT is superimposed on activity from the dominant necrotic left cervical node. Incidental CT findings: none CHEST: No hypermetabolic mediastinal or hilar nodes. No suspicious pulmonary nodules on the CT scan. Incidental CT findings: Coronary artery calcification is evident. Atherosclerotic calcification is noted in the wall of the thoracic aorta. No suspicious pulmonary nodule or mass. Tiny nodule in the right lower lobe (107/3) is stable since lung cancer screening CT of 11/17/2019, characterized as benign. No focal airspace consolidation. There is no evidence of pleural effusion. ABDOMEN/PELVIS: No abnormal hypermetabolic activity within the liver, pancreas, adrenal glands, or spleen. No hypermetabolic lymph nodes in the abdomen or pelvis. Incidental CT findings: 4.8 x 4.4 cm abdominal aortic aneurysm. Diverticular changes noted left colon. SKELETON: No focal hypermetabolic activity to suggest skeletal metastasis. Incidental CT findings: none IMPRESSION: 1. Marked hypermetabolism in the left tonsillar region, compatible with known primary neoplasm. 2. Hypermetabolism associated with the metastatic left cervical lymphadenopathy as characterized on recent diagnostic CT neck. 3. No evidence for hypermetabolic metastatic disease in the chest, abdomen, or pelvis. 4. 4.8 cm abdominal aortic aneurysm. Recommend follow-up every 6 months and vascular consultation. This recommendation follows ACR consensus guidelines: White Paper of the ACR Incidental Findings Committee II on Vascular Findings. J Am Coll Radiol 2013; 37:106-269. 5.  Aortic Atherosclerois (ICD10-170.0) Electronically Signed   By: Misty Stanley M.D.   On: 02/29/2020 16:28     Assessment and plan- Patient is a 64 y.o. 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  He is here for on treatment assessment prior to cycle 2  of weekly cisplatin chemotherapy  Counts okay to proceed with cycle 2 of weekly cisplatin chemotherapy today.  I will see him back in 2 weeks for cycle 4 and he will directly proceed for cycle 3 next week.  On physical exam today he does have grade 1 mucositis although patient does not report any significant pain.  I will give him a prescription for Magic mouthwash for the same.  Patient encouraged to call us if he has worsening pain and/or difficulty swallowing   Visit Diagnosis 1. Encounter for antineoplastic chemotherapy   2. Squamous cell carcinoma of oropharynx ([place])   3. Mucositis      Dr. Randa Evens, MD, MPH [facility] [phone] 03/26/2020 9:28 AM

## 2020-03-26 NOTE — Progress Notes (Signed)
Pt here for treatment and doing well pt states. His taste buds are changing but he is eating good. This am he had biscuit, sausage, grits, gravy today. He did wake up one morning with sweating over night-his hair wet, bed linens wet just once

## 2020-03-27 ENCOUNTER — Ambulatory Visit
Admission: RE | Admit: 2020-03-27 | Discharge: 2020-03-27 | Disposition: A | Discharge status: Home / Self Care | Payer: Medicare Other | Source: Ambulatory Visit | Attending: Radiation Oncology | Admitting: Radiation Oncology

## 2020-03-27 ENCOUNTER — Encounter (INDEPENDENT_AMBULATORY_CARE_PROVIDER_SITE_OTHER): Payer: Self-pay | Admitting: Vascular Surgery

## 2020-03-27 DIAGNOSIS — Z510 Encounter for antineoplastic radiation therapy: Secondary | ICD-10-CM | POA: Diagnosis not present

## 2020-03-28 ENCOUNTER — Other Ambulatory Visit: Payer: Self-pay | Admitting: *Deleted

## 2020-03-28 ENCOUNTER — Ambulatory Visit
Admission: RE | Admit: 2020-03-28 | Discharge: 2020-03-28 | Disposition: A | Discharge status: Home / Self Care | Payer: Medicare Other | Source: Ambulatory Visit | Attending: Radiation Oncology | Admitting: Radiation Oncology

## 2020-03-28 DIAGNOSIS — Z510 Encounter for antineoplastic radiation therapy: Secondary | ICD-10-CM | POA: Diagnosis not present

## 2020-03-28 NOTE — Interval H&P Note (Signed)
History and Physical Interval Note:  03/28/2020 3:10 PM  Ronnie Ingram  has presented today for surgery, with the diagnosis of [facility] Cath Placement   Tonsillar CaCovid  Sept 20.  The various methods of treatment have been discussed with the patient and family. After consideration of risks, benefits and other options for treatment, the patient has consented to  Procedure(s): PORTA CATH INSERTION (N/A) as a surgical intervention.  The patient's history has been reviewed, patient examined, no change in status, stable for surgery.  I have reviewed the patient's chart and labs.  Questions were answered to the patient's satisfaction.     Leotis Pain

## 2020-03-29 ENCOUNTER — Ambulatory Visit
Admission: RE | Admit: 2020-03-29 | Discharge: 2020-03-29 | Disposition: A | Discharge status: Home / Self Care | Payer: Medicare Other | Source: Ambulatory Visit | Attending: Radiation Oncology | Admitting: Radiation Oncology

## 2020-03-29 DIAGNOSIS — Z510 Encounter for antineoplastic radiation therapy: Secondary | ICD-10-CM | POA: Diagnosis not present

## 2020-03-30 ENCOUNTER — Ambulatory Visit
Admission: RE | Admit: 2020-03-30 | Discharge: 2020-03-30 | Disposition: A | Discharge status: Home / Self Care | Payer: Medicare Other | Source: Ambulatory Visit | Attending: Radiation Oncology | Admitting: Radiation Oncology

## 2020-03-30 DIAGNOSIS — Z510 Encounter for antineoplastic radiation therapy: Secondary | ICD-10-CM | POA: Diagnosis not present

## 2020-04-02 ENCOUNTER — Inpatient Hospital Stay: Payer: Medicare Other

## 2020-04-02 ENCOUNTER — Ambulatory Visit
Admission: RE | Admit: 2020-04-02 | Discharge: 2020-04-02 | Disposition: A | Discharge status: Home / Self Care | Payer: Medicare Other | Source: Ambulatory Visit | Attending: Radiation Oncology | Admitting: Radiation Oncology

## 2020-04-02 ENCOUNTER — Other Ambulatory Visit: Payer: Self-pay | Admitting: *Deleted

## 2020-04-02 ENCOUNTER — Inpatient Hospital Stay: Payer: Medicare Other | Admitting: Oncology

## 2020-04-02 ENCOUNTER — Inpatient Hospital Stay (HOSPITAL_BASED_OUTPATIENT_CLINIC_OR_DEPARTMENT_OTHER): Payer: Medicare Other | Admitting: Oncology

## 2020-04-02 ENCOUNTER — Other Ambulatory Visit: Payer: Self-pay

## 2020-04-02 VITALS — BP 119/69 | HR 64 | Temp 98.7°F | Resp 20 | Wt 156.4 lb

## 2020-04-02 DIAGNOSIS — B3789 Other sites of candidiasis: Secondary | ICD-10-CM | POA: Diagnosis not present

## 2020-04-02 DIAGNOSIS — Z510 Encounter for antineoplastic radiation therapy: Secondary | ICD-10-CM | POA: Diagnosis not present

## 2020-04-02 DIAGNOSIS — Z5111 Encounter for antineoplastic chemotherapy: Secondary | ICD-10-CM | POA: Diagnosis not present

## 2020-04-02 DIAGNOSIS — K1230 Oral mucositis (ulcerative), unspecified: Secondary | ICD-10-CM | POA: Diagnosis not present

## 2020-04-02 DIAGNOSIS — C109 Malignant neoplasm of oropharynx, unspecified: Secondary | ICD-10-CM

## 2020-04-02 LAB — CBC WITH DIFFERENTIAL/PLATELET
Abs Immature Granulocytes: 0.04 10*3/uL (ref 0.00–0.07)
Basophils Absolute: 0 10*3/uL (ref 0.0–0.1)
Basophils Relative: 0 %
Eosinophils Absolute: 0.2 10*3/uL (ref 0.0–0.5)
Eosinophils Relative: 2 %
HCT: 39.7 % (ref 39.0–52.0)
Hemoglobin: 14.4 g/dL (ref 13.0–17.0)
Immature Granulocytes: 0 %
Lymphocytes Relative: 12 %
Lymphs Abs: 1.3 10*3/uL (ref 0.7–4.0)
MCH: 35.1 pg — ABNORMAL HIGH (ref 26.0–34.0)
MCHC: 36.3 g/dL — ABNORMAL HIGH (ref 30.0–36.0)
MCV: 96.8 fL (ref 80.0–100.0)
Monocytes Absolute: 1 10*3/uL (ref 0.1–1.0)
Monocytes Relative: 9 %
Neutro Abs: 8.4 10*3/uL — ABNORMAL HIGH (ref 1.7–7.7)
Neutrophils Relative %: 77 %
Platelets: 224 10*3/uL (ref 150–400)
RBC: 4.1 MIL/uL — ABNORMAL LOW (ref 4.22–5.81)
RDW: 12.9 % (ref 11.5–15.5)
WBC: 11.1 10*3/uL — ABNORMAL HIGH (ref 4.0–10.5)
nRBC: 0 % (ref 0.0–0.2)

## 2020-04-02 LAB — BASIC METABOLIC PANEL
Anion gap: 9 (ref 5–15)
BUN: 25 mg/dL — ABNORMAL HIGH (ref 8–23)
CO2: 26 mmol/L (ref 22–32)
Calcium: 8.8 mg/dL — ABNORMAL LOW (ref 8.9–10.3)
Chloride: 104 mmol/L (ref 98–111)
Creatinine, Ser: 0.75 mg/dL (ref 0.61–1.24)
GFR, Estimated: >60 mL/min (ref >60)
Glucose, Bld: 139 mg/dL — ABNORMAL HIGH (ref 70–99)
Potassium: 3.9 mmol/L (ref 3.5–5.1)
Sodium: 139 mmol/L (ref 135–145)

## 2020-04-02 LAB — MAGNESIUM: Magnesium: 2 mg/dL (ref 1.7–2.4)

## 2020-04-02 MED ORDER — MORPHINE SULFATE (CONCENTRATE) 20 MG/ML PO SOLN: 10.0000 mg | ORAL | 0 refills | Status: DC | PRN

## 2020-04-02 MED ORDER — SODIUM CHLORIDE 0.9 % IV SOLN
40.0000 mg/m2 | Freq: Once | INTRAVENOUS | Status: AC
  Administered 2020-04-02: 75 mg via INTRAVENOUS
  Filled 2020-04-02: qty 75

## 2020-04-02 MED ORDER — CLOTRIMAZOLE 1 % EX OINT: TOPICAL_OINTMENT | CUTANEOUS | 0 refills | Status: DC

## 2020-04-02 MED ORDER — SODIUM CHLORIDE 0.9 % IV SOLN
Freq: Once | INTRAVENOUS | Status: AC
  Filled 2020-04-02: qty 1000

## 2020-04-02 MED ORDER — SODIUM CHLORIDE 0.9 % IV SOLN
10.0000 mg | Freq: Once | INTRAVENOUS | Status: AC
  Administered 2020-04-02: 10 mg via INTRAVENOUS
  Filled 2020-04-02: qty 10

## 2020-04-02 MED ORDER — SODIUM CHLORIDE 0.9 % IV SOLN
150.0000 mg | Freq: Once | INTRAVENOUS | Status: AC
  Administered 2020-04-02: 150 mg via INTRAVENOUS
  Filled 2020-04-02: qty 5

## 2020-04-02 MED ORDER — PALONOSETRON HCL INJECTION 0.25 MG/5ML
0.2500 mg | Freq: Once | INTRAVENOUS | Status: AC
  Administered 2020-04-02: 0.25 mg via INTRAVENOUS
  Filled 2020-04-02: qty 5

## 2020-04-02 MED ORDER — MORPHINE SULFATE (PF) 2 MG/ML IV SOLN
4.0000 mg | Freq: Once | INTRAVENOUS | Status: AC
  Administered 2020-04-02: 4 mg via INTRAVENOUS
  Filled 2020-04-02: qty 2

## 2020-04-02 MED ORDER — HEPARIN SOD (PORK) LOCK FLUSH 100 UNIT/ML IV SOLN
500.0000 [IU] | Freq: Once | INTRAVENOUS | Status: AC | PRN
  Administered 2020-04-02: 500 [IU]
  Filled 2020-04-02: qty 5

## 2020-04-02 MED ORDER — HEPARIN SOD (PORK) LOCK FLUSH 100 UNIT/ML IV SOLN
INTRAVENOUS | Status: AC
  Filled 2020-04-02: qty 5

## 2020-04-02 MED ORDER — SODIUM CHLORIDE 0.9 % IV SOLN
Freq: Once | INTRAVENOUS | Status: AC
  Filled 2020-04-02: qty 250

## 2020-04-02 NOTE — Progress Notes (Signed)
0905- Patient reports to staff that his mouth sores are worse and he is having a lot of difficulty swallowing. He reports having increased pain whenever he is eating and swallowing, but denies pain currently at this time. Patient's weight 156.4 lbs today. MD, Dr. Janese Banks, notified and aware. MD is coming to chair side to evaluate patient.  Maverick.Kapur- MD, Dr. Janese Banks, at patient's chair side. Today's lab work reviewed with MD. Per MD, Dr. Janese Banks, verbal order: proceed with scheduled Cisplatin treatment today and place verbal order for Morphine 4mg  IV once to be given in infusion today, see MAR.

## 2020-04-03 ENCOUNTER — Ambulatory Visit
Admission: RE | Admit: 2020-04-03 | Discharge: 2020-04-03 | Disposition: A | Discharge status: Home / Self Care | Payer: Medicare Other | Source: Ambulatory Visit | Attending: Radiation Oncology | Admitting: Radiation Oncology

## 2020-04-03 DIAGNOSIS — Z510 Encounter for antineoplastic radiation therapy: Secondary | ICD-10-CM | POA: Diagnosis not present

## 2020-04-03 NOTE — Progress Notes (Signed)
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 CT soft tissue neck showed a hyperenhancement of left palatine tonsil measuring 2.8 cm. Enlarged left level 2A lymph node measuring 3.4 cm in long axis and a smaller but conspicuous level 2B lymph node measuring 9 mm. PET CT scan showed marked hypermetabolism in the left tonsillar region with an SUV of 8.8. SUV 4.2 at the level 2 lymph node. The smaller level 2B lymph node is superimposed on the dominant necrotic node. 4.8 x 4.4 cm abdominal aortic aneurysm. 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 Baruch Gouty and has started concurrent chemoradiation with weekly cisplatin on 03/19/2020   Interval history-patient reports worsening mouth pain and finds it difficult to swallow.  Also reports pain when he opens his mouth to eat or yawn.  Magic mouthwash has not been helping him as much.  ECOG PS- 1 Pain scale- 5 Opioid associated constipation- no  Review of systems- Review of Systems  Constitutional: Negative for chills, fever, malaise/fatigue and weight loss.  HENT: Negative for congestion, ear discharge and nosebleeds.        Mouth sores  Eyes: Negative for blurred vision.  Respiratory: Negative for cough, hemoptysis, sputum production, shortness of breath and wheezing.   Cardiovascular: Negative for chest pain, palpitations, orthopnea and  claudication.  Gastrointestinal: Negative for abdominal pain, blood in stool, constipation, diarrhea, heartburn, melena, nausea and vomiting.  Genitourinary: Negative for dysuria, flank pain, frequency, hematuria and urgency.  Musculoskeletal: Negative for back pain, joint pain and myalgias.  Skin: Negative for rash.  Neurological: Negative for dizziness, tingling, focal weakness, seizures, weakness and headaches.  Endo/Heme/Allergies: Does not bruise/bleed easily.  Psychiatric/Behavioral: Negative for depression and suicidal ideas. The patient does not have insomnia.       No Known Allergies   Past Medical History:  Diagnosis Date  . Arthritis   . Asthma   . Brain bleed ([place])    severak years ago after falling off ladder  . COPD (chronic obstructive pulmonary disease) ([place])   . Diverticulitis   . Hypertension   . Tonsil cancer [facility])      Past Surgical History:  Procedure Laterality Date  . APPENDECTOMY    . FRACTURE SURGERY Left    ORIF left forearm  . HERNIA REPAIR Left    inguinal  . HERNIA REPAIR     abd  . INGUINAL HERNIA REPAIR Right 09/06/2018   Procedure: HERNIA REPAIR INGUINAL ADULT, RIGHT;  Surgeon: Herbert Pun, MD;  Location: [facility] ORS;  Service: General;  Laterality: Right;  . PORTA CATH INSERTION N/A 03/14/2020   Procedure: PORTA CATH INSERTION;  Surgeon: Algernon Huxley, MD;  Location: [place] [facility];  Service: Cardiovascular;  Laterality: N/A;    Social History   Socioeconomic History  . Marital status: Significant Other    Spouse name: Not on file  . Number of children: Not on file  . Years of education: Not on file  .  Highest education level: Not on file  Occupational History  . Not on file  Tobacco Use  . Smoking status: Current Every Day Smoker    Packs/day: 0.50    Years: 30.00    Pack years: 15.00    Types: Cigarettes  . Smokeless tobacco: Never Used  Vaping Use  . Vaping Use: Never used  Substance and Sexual Activity  .  Alcohol use: Yes    Alcohol/week: 12.0 standard drinks    Types: 12 Cans of beer per week  . Drug use: Yes    Types: Marijuana  . Sexual activity: Not on file  Other Topics Concern  . Not on file  Social History Narrative  . Not on file   Social Determinants of Health   Financial Resource Strain:   . Difficulty of Paying Living Expenses: Not on file  Food Insecurity:   . Worried About Charity fundraiser in the Last Year: Not on file  . Ran Out of Food in the Last Year: Not on file  Transportation Needs:   . Lack of Transportation (Medical): Not on file  . Lack of Transportation (Non-Medical): Not on file  Physical Activity:   . Days of Exercise per Week: Not on file  . Minutes of Exercise per Session: Not on file  Stress:   . Feeling of Stress : Not on file  Social Connections:   . Frequency of Communication with Friends and Family: Not on file  . Frequency of Social Gatherings with Friends and Family: Not on file  . Attends Religious Services: Not on file  . Active Member of Clubs or Organizations: Not on file  . Attends Archivist Meetings: Not on file  . Marital Status: Not on file  Intimate Partner Violence:   . Fear of Current or Ex-Partner: Not on file  . Emotionally Abused: Not on file  . Physically Abused: Not on file  . Sexually Abused: Not on file    No family history on file.   Current Outpatient Medications:  .  Clotrimazole 1 % OINT, Apply a thin layer to the affected area twice daily as needed., Disp: 30 g, Rfl: 0 .  dexamethasone (DECADRON) 4 MG tablet, Take 2 tablets (8 mg total) by mouth daily. Take daily x 3 days starting the day after cisplatin chemotherapy. Take with food., Disp: 30 tablet, Rfl: 1 .  gabapentin (NEURONTIN) 300 MG capsule, Take 300 mg by mouth 2 (two) times daily. , Disp: , Rfl:  .  lidocaine-prilocaine (EMLA) cream, Apply to affected area once, Disp: 30 g, Rfl: 3 .  lisinopril (PRINIVIL,ZESTRIL) 40 MG tablet, Take 40 mg  by mouth daily. , Disp: , Rfl:  .  LORazepam (ATIVAN) 0.5 MG tablet, Take 1 tablet (0.5 mg total) by mouth every 6 (six) hours as needed (Nausea or vomiting). (Patient not taking: Reported on 03/14/2020), Disp: 30 tablet, Rfl: 0 .  magic mouthwash w/lidocaine SOLN, Take 5 mLs by mouth 4 (four) times daily as needed for mouth pain., Disp: 340 mL, Rfl: 1 .  morphine (ROXANOL) 20 MG/ML concentrated solution, Take 0.5 mLs (10 mg total) by mouth every 4 (four) hours as needed for severe pain., Disp: 30 mL, Rfl: 0 .  ondansetron (ZOFRAN) 8 MG tablet, Take 1 tablet (8 mg total) by mouth 2 (two) times daily as needed. Start on the third day after cisplatin chemotherapy. (Patient not taking: Reported on 03/14/2020), Disp: 30 tablet, Rfl: 1 .  prochlorperazine (COMPAZINE) 10 MG  tablet, Take 1 tablet (10 mg total) by mouth every 6 (six) hours as needed (Nausea or vomiting). (Patient not taking: Reported on 03/14/2020), Disp: 30 tablet, Rfl: 1 .  tiotropium (SPIRIVA) 18 MCG inhalation capsule, Place 18 mcg into inhaler and inhale daily. , Disp: , Rfl:   Physical exam:  Physical Exam HENT:     Mouth/Throat:     Comments: Evidence of grade 2 mucositis with diffuse edema and few scattered ulcerations but no confluent ulcerations seen. Neck:     Comments: Palpable left cervical adenopathy Cardiovascular:     Rate and Rhythm: Normal rate and regular rhythm.     Heart sounds: Normal heart sounds.  Pulmonary:     Effort: Pulmonary effort is normal.     Breath sounds: Normal breath sounds.  Abdominal:     General: Bowel sounds are normal.     Palpations: Abdomen is soft.     Comments: Bilateral groin rash noted which appears fungal in nature  Skin:    General: Skin is warm and dry.  Neurological:     Mental Status: He is alert and oriented to person, place, and time.      CMP Latest Ref Rng & Units 04/02/2020  Glucose 70 - 99 mg/dL 139(H)  BUN 8 - 23 mg/dL 25(H)  Creatinine 0.61 - 1.24 mg/dL 0.75    Sodium 135 - 145 mmol/L 139  Potassium 3.5 - 5.1 mmol/L 3.9  Chloride 98 - 111 mmol/L 104  CO2 22 - 32 mmol/L 26  Calcium 8.9 - 10.3 mg/dL 8.8(L)  Total Protein 6.5 - 8.1 g/dL -  Total Bilirubin 0.3 - 1.2 mg/dL -  Alkaline Phos 38 - 126 U/L -  AST 15 - 41 U/L -  ALT 0 - 44 U/L -   CBC Latest Ref Rng & Units 04/02/2020  WBC 4.0 - 10.5 K/uL 11.1(H)  Hemoglobin 13.0 - 17.0 g/dL 14.4  Hematocrit 39 - 52 % 39.7  Platelets 150 - 400 K/uL 224    No images are attached to the encounter.  PERIPHERAL VASCULAR CATHETERIZATION  Result Date: 03/14/2020 See op note    Assessment and plan- Patient is a 64 y.o. 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    This is an acute visit for ongoing mild pain secondary to mucositis from chemoradiation  Grade 2 mucositis: Secondary to chemoradiation: Magic mouthwash is not helping.  I will add oral morphine 5 to 10 mg Solution which she can take every 4 hours as needed for pain.  If mucositis is worse next week we will have to hold chemoradiation at that time  He will otherwise proceed with cycle 3 of weekly cisplatin chemotherapy today.  Candida rash noted over bilateral groin.  Prescription for clotrimazole has been sent to the pharmacy   Visit Diagnosis 1. Mucositis   2. Candida rash of groin      Dr. Randa Evens, MD, MPH [facility] [phone] 04/03/2020 8:15 AM

## 2020-04-04 ENCOUNTER — Ambulatory Visit: Payer: Medicare Other

## 2020-04-05 ENCOUNTER — Ambulatory Visit: Payer: Medicare Other

## 2020-04-05 ENCOUNTER — Telehealth: Payer: Self-pay

## 2020-04-05 NOTE — Telephone Encounter (Signed)
Nutrition  RD called patient for nutrition follow-up.  No answer or option to leave voicemail.  Mail box has not been set up.  Will follow-up at later time.   Keri Veale B. Zenia Resides, [place], [place] Registered Dietitian [phone] (mobile)

## 2020-04-06 ENCOUNTER — Telehealth: Payer: Self-pay | Admitting: *Deleted

## 2020-04-06 ENCOUNTER — Ambulatory Visit: Payer: Medicare Other

## 2020-04-06 ENCOUNTER — Encounter: Payer: Self-pay | Admitting: Oncology

## 2020-04-06 ENCOUNTER — Inpatient Hospital Stay (HOSPITAL_BASED_OUTPATIENT_CLINIC_OR_DEPARTMENT_OTHER): Payer: Medicare Other | Admitting: Oncology

## 2020-04-06 ENCOUNTER — Inpatient Hospital Stay: Payer: Medicare Other

## 2020-04-06 ENCOUNTER — Other Ambulatory Visit: Payer: Self-pay

## 2020-04-06 VITALS — BP 111/74 | HR 69 | Resp 18 | Wt 154.0 lb

## 2020-04-06 DIAGNOSIS — K1230 Oral mucositis (ulcerative), unspecified: Secondary | ICD-10-CM | POA: Diagnosis not present

## 2020-04-06 DIAGNOSIS — Z95828 Presence of other vascular implants and grafts: Secondary | ICD-10-CM

## 2020-04-06 DIAGNOSIS — R6883 Chills (without fever): Secondary | ICD-10-CM

## 2020-04-06 DIAGNOSIS — G893 Neoplasm related pain (acute) (chronic): Secondary | ICD-10-CM | POA: Diagnosis not present

## 2020-04-06 DIAGNOSIS — Z5189 Encounter for other specified aftercare: Secondary | ICD-10-CM

## 2020-04-06 DIAGNOSIS — E860 Dehydration: Secondary | ICD-10-CM | POA: Diagnosis not present

## 2020-04-06 DIAGNOSIS — C109 Malignant neoplasm of oropharynx, unspecified: Secondary | ICD-10-CM

## 2020-04-06 DIAGNOSIS — R059 Cough, unspecified: Secondary | ICD-10-CM

## 2020-04-06 LAB — CBC WITH DIFFERENTIAL/PLATELET
Abs Immature Granulocytes: 0.02 10*3/uL (ref 0.00–0.07)
Basophils Absolute: 0.1 10*3/uL (ref 0.0–0.1)
Basophils Relative: 1 %
Eosinophils Absolute: 0.3 10*3/uL (ref 0.0–0.5)
Eosinophils Relative: 4 %
HCT: 42 % (ref 39.0–52.0)
Hemoglobin: 15.3 g/dL (ref 13.0–17.0)
Immature Granulocytes: 0 %
Lymphocytes Relative: 20 %
Lymphs Abs: 1.4 10*3/uL (ref 0.7–4.0)
MCH: 35.4 pg — ABNORMAL HIGH (ref 26.0–34.0)
MCHC: 36.4 g/dL — ABNORMAL HIGH (ref 30.0–36.0)
MCV: 97.2 fL (ref 80.0–100.0)
Monocytes Absolute: 0.7 10*3/uL (ref 0.1–1.0)
Monocytes Relative: 10 %
Neutro Abs: 4.5 10*3/uL (ref 1.7–7.7)
Neutrophils Relative %: 65 %
Platelets: 179 10*3/uL (ref 150–400)
RBC: 4.32 MIL/uL (ref 4.22–5.81)
RDW: 12.8 % (ref 11.5–15.5)
WBC: 6.9 10*3/uL (ref 4.0–10.5)
nRBC: 0 % (ref 0.0–0.2)

## 2020-04-06 LAB — COMPREHENSIVE METABOLIC PANEL
ALT: 55 U/L — ABNORMAL HIGH (ref 0–44)
AST: 37 U/L (ref 15–41)
Albumin: 3.7 g/dL (ref 3.5–5.0)
Alkaline Phosphatase: 69 U/L (ref 38–126)
Anion gap: 9 (ref 5–15)
BUN: 25 mg/dL — ABNORMAL HIGH (ref 8–23)
CO2: 28 mmol/L (ref 22–32)
Calcium: 8.7 mg/dL — ABNORMAL LOW (ref 8.9–10.3)
Chloride: 100 mmol/L (ref 98–111)
Creatinine, Ser: 0.68 mg/dL (ref 0.61–1.24)
GFR, Estimated: >60 mL/min (ref >60)
Glucose, Bld: 113 mg/dL — ABNORMAL HIGH (ref 70–99)
Potassium: 4.2 mmol/L (ref 3.5–5.1)
Sodium: 137 mmol/L (ref 135–145)
Total Bilirubin: 0.8 mg/dL (ref 0.3–1.2)
Total Protein: 7.2 g/dL (ref 6.5–8.1)

## 2020-04-06 MED ORDER — SODIUM CHLORIDE 0.9 % IV SOLN
INTRAVENOUS | Status: AC
  Filled 2020-04-06 (×2): qty 250

## 2020-04-06 MED ORDER — HEPARIN SOD (PORK) LOCK FLUSH 100 UNIT/ML IV SOLN
INTRAVENOUS | Status: AC
  Filled 2020-04-06: qty 5

## 2020-04-06 MED ORDER — HEPARIN SOD (PORK) LOCK FLUSH 100 UNIT/ML IV SOLN
500.0000 [IU] | Freq: Once | INTRAVENOUS | Status: AC
  Administered 2020-04-06: 500 [IU] via INTRAVENOUS
  Filled 2020-04-06: qty 5

## 2020-04-06 MED ORDER — SODIUM CHLORIDE 0.9% FLUSH
10.0000 mL | Freq: Once | INTRAVENOUS | Status: AC
  Administered 2020-04-06: 10 mL via INTRAVENOUS
  Filled 2020-04-06: qty 10

## 2020-04-06 NOTE — Telephone Encounter (Signed)
Add him to my schedule please today. Labs cbc with diff and cmp

## 2020-04-06 NOTE — Telephone Encounter (Signed)
Ronnie Ingram called reporting that he is spitting up green and brown sputum, There was blood in it last night. Does not know if he is fevered, but he is clammy and he is having chills then sweaty as well. He is unable to eat and has lost weight. He is constipated last bowel movement was 3 days ago. He did pass 3 "pebbles" yesterday, he has not taken any medicine for constipation and she said he won't unless told to do so. She is asking for antibiotics and for something for the constipation. Please advise

## 2020-04-06 NOTE — Telephone Encounter (Signed)
Accepts appointment for [phone] lab 100 physician. Orders entered and lab notified

## 2020-04-08 ENCOUNTER — Other Ambulatory Visit: Payer: Self-pay

## 2020-04-08 DIAGNOSIS — C099 Malignant neoplasm of tonsil, unspecified: Secondary | ICD-10-CM | POA: Diagnosis present

## 2020-04-08 DIAGNOSIS — F17210 Nicotine dependence, cigarettes, uncomplicated: Secondary | ICD-10-CM | POA: Diagnosis present

## 2020-04-08 DIAGNOSIS — L309 Dermatitis, unspecified: Secondary | ICD-10-CM | POA: Diagnosis present

## 2020-04-08 DIAGNOSIS — Z20822 Contact with and (suspected) exposure to covid-19: Secondary | ICD-10-CM | POA: Diagnosis present

## 2020-04-08 DIAGNOSIS — X58XXXA Exposure to other specified factors, initial encounter: Secondary | ICD-10-CM | POA: Diagnosis present

## 2020-04-08 DIAGNOSIS — I714 Abdominal aortic aneurysm, without rupture: Secondary | ICD-10-CM | POA: Diagnosis present

## 2020-04-08 DIAGNOSIS — T451X5A Adverse effect of antineoplastic and immunosuppressive drugs, initial encounter: Secondary | ICD-10-CM | POA: Diagnosis present

## 2020-04-08 DIAGNOSIS — K1231 Oral mucositis (ulcerative) due to antineoplastic therapy: Principal | ICD-10-CM | POA: Diagnosis present

## 2020-04-08 DIAGNOSIS — E43 Unspecified severe protein-calorie malnutrition: Secondary | ICD-10-CM | POA: Diagnosis present

## 2020-04-08 DIAGNOSIS — E860 Dehydration: Secondary | ICD-10-CM | POA: Diagnosis present

## 2020-04-08 DIAGNOSIS — Z6821 Body mass index (BMI) 21.0-21.9, adult: Secondary | ICD-10-CM

## 2020-04-08 DIAGNOSIS — R627 Adult failure to thrive: Secondary | ICD-10-CM | POA: Diagnosis present

## 2020-04-08 DIAGNOSIS — R519 Headache, unspecified: Secondary | ICD-10-CM | POA: Diagnosis present

## 2020-04-08 DIAGNOSIS — I10 Essential (primary) hypertension: Secondary | ICD-10-CM | POA: Diagnosis present

## 2020-04-08 DIAGNOSIS — Z79899 Other long term (current) drug therapy: Secondary | ICD-10-CM

## 2020-04-08 DIAGNOSIS — C109 Malignant neoplasm of oropharynx, unspecified: Secondary | ICD-10-CM | POA: Diagnosis present

## 2020-04-08 DIAGNOSIS — J449 Chronic obstructive pulmonary disease, unspecified: Secondary | ICD-10-CM | POA: Diagnosis present

## 2020-04-08 LAB — CBC
HCT: 40.4 % (ref 39.0–52.0)
Hemoglobin: 14.6 g/dL (ref 13.0–17.0)
MCH: 35 pg — ABNORMAL HIGH (ref 26.0–34.0)
MCHC: 36.1 g/dL — ABNORMAL HIGH (ref 30.0–36.0)
MCV: 96.9 fL (ref 80.0–100.0)
Platelets: 176 10*3/uL (ref 150–400)
RBC: 4.17 MIL/uL — ABNORMAL LOW (ref 4.22–5.81)
RDW: 12.8 % (ref 11.5–15.5)
WBC: 7.1 10*3/uL (ref 4.0–10.5)
nRBC: 0 % (ref 0.0–0.2)

## 2020-04-08 LAB — LIPASE, BLOOD: Lipase: 25 U/L (ref 11–51)

## 2020-04-08 LAB — TROPONIN I (HIGH SENSITIVITY): Troponin I (High Sensitivity): 14 ng/L (ref <18)

## 2020-04-08 LAB — COMPREHENSIVE METABOLIC PANEL
ALT: 42 U/L (ref 0–44)
AST: 27 U/L (ref 15–41)
Albumin: 3.9 g/dL (ref 3.5–5.0)
Alkaline Phosphatase: 83 U/L (ref 38–126)
Anion gap: 10 (ref 5–15)
BUN: 20 mg/dL (ref 8–23)
CO2: 23 mmol/L (ref 22–32)
Calcium: 9.1 mg/dL (ref 8.9–10.3)
Chloride: 100 mmol/L (ref 98–111)
Creatinine, Ser: 0.78 mg/dL (ref 0.61–1.24)
GFR, Estimated: >60 mL/min (ref >60)
Glucose, Bld: 109 mg/dL — ABNORMAL HIGH (ref 70–99)
Potassium: 4.1 mmol/L (ref 3.5–5.1)
Sodium: 133 mmol/L — ABNORMAL LOW (ref 135–145)
Total Bilirubin: 1.1 mg/dL (ref 0.3–1.2)
Total Protein: 7.5 g/dL (ref 6.5–8.1)

## 2020-04-08 MED ORDER — LACTATED RINGERS IV BOLUS
1000.0000 mL | Freq: Once | INTRAVENOUS | Status: AC
  Administered 2020-04-08: 1000 mL via INTRAVENOUS

## 2020-04-08 NOTE — ED Triage Notes (Signed)
Pt to ED POV, c/o weakness and nausea since receiving chemo for throat cancer yesterday.  States he has lost 10 pounds in 2 weeks.  C/o pain to mouth and ulcers to mouth that started with chemo

## 2020-04-08 NOTE — ED Notes (Signed)
Pt in family room laying down, states wanting to leave d/t waiting for an hour. This RN explained we will get him to a room as soon as we can. Pt still choosing to leave. Alert and oriented, clear speech, steady gait

## 2020-04-09 ENCOUNTER — Inpatient Hospital Stay
Admission: EM | Admit: 2020-04-09 | Discharge: 2020-04-13 | DRG: 157 | Disposition: A | Discharge status: Home / Self Care | Payer: Medicare Other | Attending: Internal Medicine | Admitting: Internal Medicine

## 2020-04-09 ENCOUNTER — Ambulatory Visit: Payer: Medicare Other

## 2020-04-09 ENCOUNTER — Inpatient Hospital Stay: Payer: Medicare Other

## 2020-04-09 ENCOUNTER — Inpatient Hospital Stay: Payer: Medicare Other | Admitting: Oncology

## 2020-04-09 ENCOUNTER — Encounter: Payer: Self-pay | Admitting: Internal Medicine

## 2020-04-09 DIAGNOSIS — E860 Dehydration: Secondary | ICD-10-CM | POA: Diagnosis present

## 2020-04-09 DIAGNOSIS — Z6821 Body mass index (BMI) 21.0-21.9, adult: Secondary | ICD-10-CM | POA: Diagnosis not present

## 2020-04-09 DIAGNOSIS — I10 Essential (primary) hypertension: Secondary | ICD-10-CM | POA: Diagnosis present

## 2020-04-09 DIAGNOSIS — Z79899 Other long term (current) drug therapy: Secondary | ICD-10-CM | POA: Diagnosis not present

## 2020-04-09 DIAGNOSIS — K1230 Oral mucositis (ulcerative), unspecified: Secondary | ICD-10-CM | POA: Diagnosis present

## 2020-04-09 DIAGNOSIS — T451X5A Adverse effect of antineoplastic and immunosuppressive drugs, initial encounter: Secondary | ICD-10-CM | POA: Diagnosis present

## 2020-04-09 DIAGNOSIS — I714 Abdominal aortic aneurysm, without rupture: Secondary | ICD-10-CM | POA: Diagnosis present

## 2020-04-09 DIAGNOSIS — E43 Unspecified severe protein-calorie malnutrition: Secondary | ICD-10-CM | POA: Insufficient documentation

## 2020-04-09 DIAGNOSIS — K1231 Oral mucositis (ulcerative) due to antineoplastic therapy: Secondary | ICD-10-CM | POA: Diagnosis present

## 2020-04-09 DIAGNOSIS — R627 Adult failure to thrive: Secondary | ICD-10-CM | POA: Diagnosis present

## 2020-04-09 DIAGNOSIS — X58XXXA Exposure to other specified factors, initial encounter: Secondary | ICD-10-CM | POA: Diagnosis present

## 2020-04-09 DIAGNOSIS — C109 Malignant neoplasm of oropharynx, unspecified: Secondary | ICD-10-CM | POA: Diagnosis present

## 2020-04-09 DIAGNOSIS — J449 Chronic obstructive pulmonary disease, unspecified: Secondary | ICD-10-CM | POA: Diagnosis present

## 2020-04-09 DIAGNOSIS — Z20822 Contact with and (suspected) exposure to covid-19: Secondary | ICD-10-CM | POA: Diagnosis present

## 2020-04-09 DIAGNOSIS — R519 Headache, unspecified: Secondary | ICD-10-CM | POA: Diagnosis present

## 2020-04-09 DIAGNOSIS — C099 Malignant neoplasm of tonsil, unspecified: Secondary | ICD-10-CM | POA: Diagnosis present

## 2020-04-09 DIAGNOSIS — F17210 Nicotine dependence, cigarettes, uncomplicated: Secondary | ICD-10-CM | POA: Diagnosis present

## 2020-04-09 DIAGNOSIS — L309 Dermatitis, unspecified: Secondary | ICD-10-CM | POA: Diagnosis present

## 2020-04-09 LAB — RESPIRATORY PANEL BY RT PCR (FLU A&B, COVID)
Influenza A by PCR: NEGATIVE
Influenza B by PCR: NEGATIVE
SARS Coronavirus 2 by RT PCR: NEGATIVE

## 2020-04-09 MED ORDER — ORAL CARE MOUTH RINSE
15.0000 mL | Freq: Two times a day (BID) | OROMUCOSAL | Status: DC
  Administered 2020-04-09 – 2020-04-13 (×5): 15 mL via OROMUCOSAL

## 2020-04-09 MED ORDER — DEXAMETHASONE 4 MG PO TABS
8.0000 mg | ORAL_TABLET | Freq: Every day | ORAL | Status: DC
  Administered 2020-04-12: 8 mg via ORAL
  Filled 2020-04-09 (×5): qty 2

## 2020-04-09 MED ORDER — DEXTROSE-NACL 5-0.45 % IV SOLN: INTRAVENOUS | Status: DC

## 2020-04-09 MED ORDER — ONDANSETRON HCL 4 MG PO TABS: 4.0000 mg | ORAL_TABLET | Freq: Four times a day (QID) | ORAL | Status: DC | PRN

## 2020-04-09 MED ORDER — ENOXAPARIN SODIUM 40 MG/0.4ML ~~LOC~~ SOLN
40.0000 mg | SUBCUTANEOUS | Status: DC
  Administered 2020-04-10 – 2020-04-12 (×2): 40 mg via SUBCUTANEOUS
  Filled 2020-04-09 (×3): qty 0.4

## 2020-04-09 MED ORDER — LIDOCAINE-PRILOCAINE 2.5-2.5 % EX CREA
TOPICAL_CREAM | Freq: Once | CUTANEOUS | Status: DC
  Filled 2020-04-09: qty 5

## 2020-04-09 MED ORDER — DEXAMETHASONE 4 MG PO TABS: 8.0000 mg | ORAL_TABLET | ORAL | Status: DC

## 2020-04-09 MED ORDER — MORPHINE SULFATE (CONCENTRATE) 10 MG/0.5ML PO SOLN
10.0000 mg | ORAL | Status: DC | PRN
  Administered 2020-04-10 – 2020-04-11 (×2): 10 mg via ORAL
  Filled 2020-04-09 (×3): qty 0.5

## 2020-04-09 MED ORDER — MAGIC MOUTHWASH W/LIDOCAINE
5.0000 mL | Freq: Four times a day (QID) | ORAL | Status: DC | PRN
  Filled 2020-04-09 (×3): qty 5

## 2020-04-09 MED ORDER — TIOTROPIUM BROMIDE MONOHYDRATE 18 MCG IN CAPS
18.0000 ug | ORAL_CAPSULE | Freq: Every day | RESPIRATORY_TRACT | Status: DC
  Administered 2020-04-09 – 2020-04-13 (×4): 18 ug via RESPIRATORY_TRACT
  Filled 2020-04-09: qty 5

## 2020-04-09 MED ORDER — GABAPENTIN 300 MG PO CAPS: 300.0000 mg | ORAL_CAPSULE | Freq: Two times a day (BID) | ORAL | Status: DC | PRN

## 2020-04-09 MED ORDER — PANTOPRAZOLE SODIUM 40 MG IV SOLR
40.0000 mg | INTRAVENOUS | Status: DC
  Administered 2020-04-09 – 2020-04-12 (×4): 40 mg via INTRAVENOUS
  Filled 2020-04-09 (×4): qty 40

## 2020-04-09 MED ORDER — MORPHINE SULFATE (PF) 2 MG/ML IV SOLN
2.0000 mg | Freq: Once | INTRAVENOUS | Status: AC
  Administered 2020-04-09: 2 mg via INTRAVENOUS

## 2020-04-09 MED ORDER — ONDANSETRON HCL 4 MG/2ML IJ SOLN: 4.0000 mg | Freq: Four times a day (QID) | INTRAMUSCULAR | Status: DC | PRN

## 2020-04-09 NOTE — ED Provider Notes (Signed)
[facility] Emergency Department Provider Note  ____________________________________________   First MD Initiated Contact with Patient 04/09/20 [phone]     (approximate)  I have reviewed the triage vital signs and the nursing notes.   HISTORY  Chief Complaint Weakness and Nausea    HPI Ronnie Ingram is a 64 y.o. male with below list of previous medical conditions including COPD hypertension tonsillar cancer which patient is currently receiving chemoradiation therapy last treatment on Tuesday and mucositis presents to the emergency department secondary to inability to tolerate eating with approximately 10 pound weight loss over the past 2 weeks.  Patient was seen by Dr. Janese Banks oncology yesterday with worsening mucositis.  Patient states Magic mouthwash and liquid morphine which he was prescribed are not alleviating his discomfort.  Plan was for the patient have a PEG put to placed on Thursday however patient presents to the emergency department today secondary to inability to tolerate the beforementioned.        Past Medical History:  Diagnosis Date  . Arthritis   . Asthma   . Brain bleed ([place])    severak years ago after falling off ladder  . COPD (chronic obstructive pulmonary disease) ([place])   . Diverticulitis   . Hypertension   . Tonsil cancer [facility])     Patient Active Problem List   Diagnosis Date Noted  . 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 Smoker 12/07/2017    Past Surgical History:  Procedure Laterality Date  . APPENDECTOMY    . FRACTURE SURGERY Left    ORIF left forearm  . HERNIA REPAIR Left    inguinal  . HERNIA REPAIR     abd  . INGUINAL HERNIA REPAIR Right 09/06/2018   Procedure: HERNIA REPAIR INGUINAL ADULT, RIGHT;  Surgeon: Herbert Pun, MD;  Location: [facility] ORS;  Service: General;  Laterality: Right;  . PORTA CATH INSERTION N/A 03/14/2020   Procedure: PORTA CATH INSERTION;  Surgeon: Algernon Huxley, MD;   Location: [place] [facility] LAB;  Service: Cardiovascular;  Laterality: N/A;    Prior to Admission medications   Medication Sig Start Date End Date Taking? Authorizing Provider  Clotrimazole 1 % OINT Apply a thin layer to the affected area twice daily as needed. Patient taking differently: Apply 1 application topically daily as needed (rash).  04/02/20  Yes Sindy Guadeloupe, MD  dexamethasone (DECADRON) 4 MG tablet Take 2 tablets (8 mg total) by mouth daily. Take daily x 3 days starting the day after cisplatin chemotherapy. Take with food. Patient taking differently: Take 8 mg by mouth as directed. Take daily x 3 days starting the day after cisplatin chemotherapy. Take with food. 03/08/20  Yes Sindy Guadeloupe, MD  gabapentin (NEURONTIN) 300 MG capsule Take 300 mg by mouth 2 (two) times daily as needed (pain).    Yes [provider]  lidocaine-prilocaine (EMLA) cream Apply to affected area once 03/08/20  Yes Sindy Guadeloupe, MD  lisinopril (PRINIVIL,ZESTRIL) 40 MG tablet Take 40 mg by mouth daily.    Yes [provider]  magic mouthwash w/lidocaine SOLN Take 5 mLs by mouth 4 (four) times daily as needed for mouth pain. 03/26/20  Yes Sindy Guadeloupe, MD  morphine (ROXANOL) 20 MG/ML concentrated solution Take 0.5 mLs (10 mg total) by mouth every 4 (four) hours as needed for severe pain. 04/02/20  Yes Sindy Guadeloupe, MD  tiotropium (SPIRIVA) 18 MCG inhalation capsule Place 18 mcg into inhaler and inhale daily.  Yes [provider]  LORazepam (ATIVAN) 0.5 MG tablet Take 1 tablet (0.5 mg total) by mouth every 6 (six) hours as needed (Nausea or vomiting). Patient not taking: Reported on 03/14/2020 03/08/20   Sindy Guadeloupe, MD  ondansetron (ZOFRAN) 8 MG tablet Take 1 tablet (8 mg total) by mouth 2 (two) times daily as needed. Start on the third day after cisplatin chemotherapy. Patient not taking: Reported on 03/14/2020 03/08/20   Sindy Guadeloupe, MD  prochlorperazine (COMPAZINE) 10 MG  tablet Take 1 tablet (10 mg total) by mouth every 6 (six) hours as needed (Nausea or vomiting). Patient not taking: Reported on 03/14/2020 03/08/20   Sindy Guadeloupe, MD    Allergies Patient has no known allergies.  No family history on file.  Social History Social History   Tobacco Use  . Smoking status: Current Every Day Smoker    Packs/day: 0.50    Years: 30.00    Pack years: 15.00    Types: Cigarettes  . Smokeless tobacco: Never Used  Vaping Use  . Vaping Use: Never used  Substance Use Topics  . Alcohol use: Yes    Alcohol/week: 12.0 standard drinks    Types: 12 Cans of beer per week  . Drug use: Yes    Types: Marijuana    Review of Systems Constitutional: No fever/chills Eyes: No visual changes. ENT: Positive for mouth and throat pain Cardiovascular: Denies chest pain. Respiratory: Denies shortness of breath. Gastrointestinal: No abdominal pain.  No nausea, no vomiting.  No diarrhea.  No constipation. Genitourinary: Negative for dysuria. Musculoskeletal: Negative for neck pain.  Negative for back pain. Integumentary: Negative for rash. Neurological: Negative for headaches, focal weakness or numbness.   ____________________________________________   PHYSICAL EXAM:  VITAL SIGNS: ED Triage Vitals  Enc Vitals Group     BP 04/08/20 1449 105/60     Pulse Rate 04/08/20 1449 81     Resp 04/08/20 1449 20     Temp 04/08/20 1449 98.5 F (36.9 C)     Temp Source 04/08/20 1449 Oral     SpO2 04/08/20 1449 96 %     Weight 04/08/20 1450 69 kg (152 lb 1.9 oz)     Height 04/08/20 1450 1.753 m (5\' 9" )     Head Circumference --      Peak Flow --      Pain Score --      Pain Loc --      Pain Edu? --      Excl. in [place]? --     Constitutional: Alert and oriented.  Eyes: Conjunctivae are normal.  Mouth/Throat: Findings consistent with mucositis (mucosal denudement shallow ulcer noted) Neck: No stridor.  No meningeal signs.   Cardiovascular: Normal rate, regular rhythm.  Good peripheral circulation. Grossly normal heart sounds. Respiratory: Normal respiratory effort.  No retractions. Gastrointestinal: Soft and nontender. No distention.  Musculoskeletal: No lower extremity tenderness nor edema. No gross deformities of extremities. Neurologic:  Normal speech and language. No gross focal neurologic deficits are appreciated.  Skin:  Skin is warm, dry and intact. Psychiatric: Mood and affect are normal. Speech and behavior are normal.  ____________________________________________   LABS (all labs ordered are listed, but only abnormal results are displayed)  Labs Reviewed  COMPREHENSIVE METABOLIC PANEL - Abnormal; Notable for the following components:      Result Value   Sodium 133 (*)    Glucose, Bld 109 (*)    All other components within normal limits  CBC -  Abnormal; Notable for the following components:   RBC 4.17 (*)    MCH 35.0 (*)    MCHC 36.1 (*)    All other components within normal limits  LIPASE, BLOOD  URINALYSIS, COMPLETE (UACMP) WITH MICROSCOPIC  TROPONIN I (HIGH SENSITIVITY)   __   Procedures   ____________________________________________   INITIAL IMPRESSION / MDM / ASSESSMENT AND PLAN / ED COURSE  As part of my medical decision making, I reviewed the following data within the electronic MEDICAL RECORD NUMBER  64 year old male presented with above-stated history and physical exam secondary to inability to tolerate eating due to mucositis in the setting of chemo and radiation therapy.  Patient discussed with Dr. Janese Banks oncology who recommended that the patient be admitted to the hospital.  D5 half-normal saline at 125 mL's per hour currently running.  Patient also given additional morphine 2 mg IV. ____________________________________________  FINAL CLINICAL IMPRESSION(S) / ED DIAGNOSES  Final diagnoses:  Mucositis due to chemotherapy     MEDICATIONS GIVEN DURING THIS VISIT:  Medications  dextrose 5 %-0.45 % sodium chloride  infusion ( Intravenous New Bag/Given 04/09/20 0356)  lactated ringers bolus 1,000 mL (0 mLs Intravenous Stopped 04/08/20 2114)  morphine 2 MG/ML injection 2 mg (2 mg Intravenous Given 04/09/20 0356)     ED Discharge Orders    None      *Please note:  Ronnie Ingram was evaluated in Emergency Department on 04/09/2020 for the symptoms described in the history of present illness. He was evaluated in the context of the global COVID-19 pandemic, which necessitated consideration that the patient might be at risk for infection with the SARS-CoV-2 virus that causes COVID-19. Institutional protocols and algorithms that pertain to the evaluation of patients at risk for COVID-19 are in a state of rapid change based on information released by regulatory bodies including the CDC and federal and state organizations. These policies and algorithms were followed during the patient's care in the ED.  Some ED evaluations and interventions may be delayed as a result of limited staffing during and after the pandemic.*  Note:  This document was prepared using Dragon voice recognition software and may include unintentional dictation errors.   Gregor Hams, MD 04/09/20 [phone]

## 2020-04-09 NOTE — ED Notes (Signed)
Pt sleeping. Bed locked low. Rail up. Call bell within reach. Lights dimmed per pt's request.

## 2020-04-09 NOTE — ED Notes (Signed)
Pt given urinal and bedside commode.

## 2020-04-09 NOTE — ED Notes (Signed)
Pt given meal tray.

## 2020-04-09 NOTE — ED Notes (Signed)
Pt's significant other at bedside. Pt given water as requested.

## 2020-04-09 NOTE — ED Notes (Signed)
NT at bedside.  

## 2020-04-09 NOTE — Consult Note (Signed)
Hematology/Oncology Consult note [facility] [phone] [phone]  Patient Care Team: Renette Butters as PCP - General (Physician Assistant)   Name of the patient: Ronnie Ingram  [id number]  1956-01-26    Reason for consult: Head and neck cancer currently undergoing chemoradiation complicated by mucositis and weight loss   Requesting physician: Dr. 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  He last received cisplatin chemotherapy on 04/03/2020.  Treatment has been complicated by mucositis secondary to chemoradiation which has made it hard for patient to eat solid food.  I did see him in my clinic on 04/06/2020 at that point he was noted to have significant ongoing weight loss as well as severe mucositis although he was able to keep 2-3 ensures going every day.  My plan was for him to get an elective PEG tube as an outpatient on 04/12/2020 which was going to be done by Dr. Adora Fridge.  However patient deteriorated at home over the weekend and had a further 4 pound weight loss which got the family worried and he was therefore brought to the hospital.  Surprisingly despite poor hydration and oral intake his labs have remained stable with no evidence of hypokalemia or renal injury.His CBC has also remained stable.  Currently reports feeling better after receiving IV fluids. Still has significant mouth pain. Feels like prn morphine helps but not as much    ECOG PS- 2  Pain scale- 4   Review of systems- Review of Systems  Constitutional: Positive for malaise/fatigue and weight loss. Negative for chills and fever.  HENT: Negative for congestion, ear discharge and nosebleeds.        Mouth sores  Eyes: Negative for blurred vision.    Respiratory: Negative for cough, hemoptysis, sputum production, shortness of breath and wheezing.   Cardiovascular: Negative for chest pain, palpitations, orthopnea and claudication.  Gastrointestinal: Negative for abdominal pain, blood in stool, constipation, diarrhea, heartburn, melena, nausea and vomiting.  Genitourinary: Negative for dysuria, flank pain, frequency, hematuria and urgency.  Musculoskeletal: Negative for back pain, joint pain and myalgias.  Skin: Negative for rash.  Neurological: Negative for dizziness, tingling, focal weakness, seizures, weakness and headaches.  Endo/Heme/Allergies: Does not bruise/bleed easily.  Psychiatric/Behavioral: Negative for depression and suicidal ideas. The patient does not have insomnia.     No Known Allergies  Patient Active Problem List   Diagnosis Date Noted  . Mucositis 04/09/2020  . COPD (chronic obstructive pulmonary disease) ([place])   . Hypertension   . 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 Smoker 12/07/2017     Past Medical History:  Diagnosis Date  . Arthritis   . Asthma   . Brain bleed ([place])    [place] years ago after falling off ladder  . COPD (chronic obstructive pulmonary disease) ([place])   . Diverticulitis   . Hypertension   . Tonsil cancer [facility])      Past Surgical History:  Procedure Laterality Date  . APPENDECTOMY    . FRACTURE SURGERY Left    ORIF left forearm  . HERNIA REPAIR Left    inguinal  . HERNIA REPAIR     abd  . INGUINAL HERNIA REPAIR Right 09/06/2018   Procedure: HERNIA REPAIR INGUINAL ADULT, RIGHT;  Surgeon: Herbert Pun, MD;  Location: [facility] ORS;  Service:  General;  Laterality: Right;  . PORTA CATH INSERTION N/A 03/14/2020   Procedure: PORTA CATH INSERTION;  Surgeon: Algernon Huxley, MD;  Location: [place] [facility] LAB;  Service: Cardiovascular;  Laterality: N/A;    Social History   Socioeconomic History  . Marital status: Significant Other    Spouse name: Not on  file  . Number of children: Not on file  . Years of education: Not on file  . Highest education level: Not on file  Occupational History  . Not on file  Tobacco Use  . Smoking status: Current Every Day Smoker    Packs/day: 0.50    Years: 30.00    Pack years: 15.00    Types: Cigarettes  . Smokeless tobacco: Never Used  Vaping Use  . Vaping Use: Never used  Substance and Sexual Activity  . Alcohol use: Yes    Alcohol/week: 12.0 standard drinks    Types: 12 Cans of beer per week  . Drug use: Yes    Types: Marijuana  . Sexual activity: Not on file  Other Topics Concern  . Not on file  Social History Narrative  . Not on file   Social Determinants of Health   Financial Resource Strain:   . Difficulty of Paying Living Expenses: Not on file  Food Insecurity:   . Worried About Charity fundraiser in the Last Year: Not on file  . Ran Out of Food in the Last Year: Not on file  Transportation Needs:   . Lack of Transportation (Medical): Not on file  . Lack of Transportation (Non-Medical): Not on file  Physical Activity:   . Days of Exercise per Week: Not on file  . Minutes of Exercise per Session: Not on file  Stress:   . Feeling of Stress : Not on file  Social Connections:   . Frequency of Communication with Friends and Family: Not on file  . Frequency of Social Gatherings with Friends and Family: Not on file  . Attends Religious Services: Not on file  . Active Member of Clubs or Organizations: Not on file  . Attends Archivist Meetings: Not on file  . Marital Status: Not on file  Intimate Partner Violence:   . Fear of Current or Ex-Partner: Not on file  . Emotionally Abused: Not on file  . Physically Abused: Not on file  . Sexually Abused: Not on file     No family history on file.   Current Facility-Administered Medications:  .  dexamethasone (DECADRON) tablet 8 mg, 8 mg, Oral, UD, Agbata, Tochukwu, MD .  dextrose 5 %-0.45 % sodium chloride infusion, ,  Intravenous, Continuous, Gregor Hams, MD, Last Rate: 125 mL/hr at 04/09/20 0356, New Bag at 04/09/20 0356 .  enoxaparin (LOVENOX) injection 40 mg, 40 mg, Subcutaneous, Q24H, Agbata, Tochukwu, MD .  gabapentin (NEURONTIN) capsule 300 mg, 300 mg, Oral, BID PRN, Agbata, Tochukwu, MD .  lidocaine-prilocaine (EMLA) cream, , Topical, Once, Agbata, Tochukwu, MD .  magic mouthwash w/lidocaine, 5 mL, Oral, QID PRN, Agbata, Tochukwu, MD .  morphine CONCENTRATE 10 MG/0.5ML oral solution 10 mg, 10 mg, Oral, Q4H PRN, Agbata, Tochukwu, MD .  ondansetron (ZOFRAN) tablet 4 mg, 4 mg, Oral, Q6H PRN **OR** ondansetron (ZOFRAN) injection 4 mg, 4 mg, Intravenous, Q6H PRN, Agbata, Tochukwu, MD .  pantoprazole (PROTONIX) injection 40 mg, 40 mg, Intravenous, Q24H, Agbata, Tochukwu, MD, 40 mg at 04/09/20 1212 .  tiotropium (SPIRIVA) inhalation capsule ([facility] use ONLY) 18 mcg, 18 mcg,  Inhalation, Daily, Agbata, Tochukwu, MD, 18 mcg at 04/09/20 1211  Current Outpatient Medications:  .  Clotrimazole 1 % OINT, Apply a thin layer to the affected area twice daily as needed. (Patient taking differently: Apply 1 application topically daily as needed (rash). ), Disp: 30 g, Rfl: 0 .  dexamethasone (DECADRON) 4 MG tablet, Take 2 tablets (8 mg total) by mouth daily. Take daily x 3 days starting the day after cisplatin chemotherapy. Take with food. (Patient taking differently: Take 8 mg by mouth as directed. Take daily x 3 days starting the day after cisplatin chemotherapy. Take with food.), Disp: 30 tablet, Rfl: 1 .  gabapentin (NEURONTIN) 300 MG capsule, Take 300 mg by mouth 2 (two) times daily as needed (pain). , Disp: , Rfl:  .  lidocaine-prilocaine (EMLA) cream, Apply to affected area once, Disp: 30 g, Rfl: 3 .  lisinopril (PRINIVIL,ZESTRIL) 40 MG tablet, Take 40 mg by mouth daily. , Disp: , Rfl:  .  magic mouthwash w/lidocaine SOLN, Take 5 mLs by mouth 4 (four) times daily as needed for mouth pain., Disp: 340 mL, Rfl: 1 .   morphine (ROXANOL) 20 MG/ML concentrated solution, Take 0.5 mLs (10 mg total) by mouth every 4 (four) hours as needed for severe pain., Disp: 30 mL, Rfl: 0 .  tiotropium (SPIRIVA) 18 MCG inhalation capsule, Place 18 mcg into inhaler and inhale daily. , Disp: , Rfl:  .  LORazepam (ATIVAN) 0.5 MG tablet, Take 1 tablet (0.5 mg total) by mouth every 6 (six) hours as needed (Nausea or vomiting). (Patient not taking: Reported on 03/14/2020), Disp: 30 tablet, Rfl: 0 .  ondansetron (ZOFRAN) 8 MG tablet, Take 1 tablet (8 mg total) by mouth 2 (two) times daily as needed. Start on the third day after cisplatin chemotherapy. (Patient not taking: Reported on 03/14/2020), Disp: 30 tablet, Rfl: 1 .  prochlorperazine (COMPAZINE) 10 MG tablet, Take 1 tablet (10 mg total) by mouth every 6 (six) hours as needed (Nausea or vomiting). (Patient not taking: Reported on 03/14/2020), Disp: 30 tablet, Rfl: 1   Physical exam:  Vitals:   04/08/20 1449 04/08/20 1450 04/08/20 1825 04/09/20 0856  BP: 105/60  (!) 128/95 (!) 147/83  Pulse: 81  74 75  Resp: 20  18 18   Temp: 98.5 F (36.9 C)  98.3 F (36.8 C)   TempSrc: Oral  Oral   SpO2: 96%  96% 96%  Weight:  152 lb 1.9 oz (69 kg)    Height:  5\' 9"  (1.753 m)     Physical Exam Constitutional:      Comments: Appears fatigued  HENT:     Head: Normocephalic and atraumatic.     Mouth/Throat:     Comments: Mucositis grade 2 appears to have improved as compared to 3 days ago Eyes:     Pupils: Pupils are equal, round, and reactive to light.  Cardiovascular:     Rate and Rhythm: Normal rate and regular rhythm.     Heart sounds: Normal heart sounds.  Pulmonary:     Effort: Pulmonary effort is normal.     Breath sounds: Normal breath sounds.  Abdominal:     General: Bowel sounds are normal.     Palpations: Abdomen is soft.  Musculoskeletal:     Cervical back: Normal range of motion.  Skin:    General: Skin is warm and dry.  Neurological:     Mental Status: He is alert  and oriented to person, place, and time.  CMP Latest Ref Rng & Units 04/08/2020  Glucose 70 - 99 mg/dL 109(H)  BUN 8 - 23 mg/dL 20  Creatinine 0.61 - 1.24 mg/dL 0.78  Sodium 135 - 145 mmol/L 133(L)  Potassium 3.5 - 5.1 mmol/L 4.1  Chloride 98 - 111 mmol/L 100  CO2 22 - 32 mmol/L 23  Calcium 8.9 - 10.3 mg/dL 9.1  Total Protein 6.5 - 8.1 g/dL 7.5  Total Bilirubin 0.3 - 1.2 mg/dL 1.1  Alkaline Phos 38 - 126 U/L 83  AST 15 - 41 U/L 27  ALT 0 - 44 U/L 42   CBC Latest Ref Rng & Units 04/08/2020  WBC 4.0 - 10.5 K/uL 7.1  Hemoglobin 13.0 - 17.0 g/dL 14.6  Hematocrit 39 - 52 % 40.4  Platelets 150 - 400 K/uL 176    @IMAGES @  PERIPHERAL VASCULAR CATHETERIZATION  Result Date: 03/14/2020 See op note   Assessment and plan- Patient is a 64 y.o. 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 He is currently admitted to the hospital for worsening mucositis and ongoing weight loss  Mucositis:appears to be healing slowly. Chemo RT on hold this week. Continue prn PO morphine and have IV prn morphien as back up. I will start him on outpatient fentanyl patch 12 mcg once discharged  Abnormal weight loss: I did get in touch with Dr. Dahlia Byes and plan is to get a PEG tube placed on 04/11/2020.  Continue supportive IV fluids until then.I have started him on full liquid diet. Will advance to solid diet if ok per surgery prior to PEG  Head and neck cancer: Chemoradiation will be on hold until mucositis resolves and patient is at his goal with tube feeds    Visit Diagnosis 1. Mucositis due to chemotherapy                                                                                 2. Abnormal weight loss  Dr. Randa Evens, MD, MPH [facility] at [facility] [phone] 04/09/2020 8:29 PM

## 2020-04-09 NOTE — ED Notes (Signed)
Pt given water 

## 2020-04-09 NOTE — ED Notes (Addendum)
Patient reports nausea and weakness since chemo 2 days ago.  Also reports having mouth pain and ulcers in mouth from chemo.  Patient awaiting admitting MD.  Patient given warm blankets, lights off per patient request.

## 2020-04-09 NOTE — Plan of Care (Signed)
  Problem: Education: Goal: Knowledge of General Education information will improve Description: Including pain rating scale, medication(s)/side effects and non-pharmacologic comfort measures Outcome: Progressing   Problem: Nutrition: Goal: Adequate nutrition will be maintained Outcome: Progressing   Problem: Coping: Goal: Level of anxiety will decrease Outcome: Progressing  Pt to unit at this time and able to ambulate from stretcher to bed, tolerated well, gait steady. Pt is AxOx4. Pt states he is here for his throat being swollen r/t throat cancer. Pt has noted large nodule on the left side of his neck, throat is red, lesions present, uvula and tonsils swollen, pain is 5/10. Pt states really nothing helps his pain and he tries his best to swallow. Pt has noted bruising to bilateral arms and dry scaly heels to bilateral feet. Sonia Baller, RN did skin check with Probation officer. Wife [name] at beside. Pt started on D5 1/2 NS at 32ml/hour. Wife states cancer MD said patient could eat whatever he wanted, writer messaged MD Janese Banks and she said FLD for tonight and will evaluate in the morning, pt and wife educated on information given to Probation officer. Pt changed into gown at this time. Pt eating ice cream currently. Pt given clean urinal for UA and educated to unit at this time, chocolate Ensure given.

## 2020-04-09 NOTE — ED Notes (Signed)
Pt resting. Visitor to stop by bedside soon.

## 2020-04-09 NOTE — H&P (Signed)
History and Physical    Ronnie Ingram YQI:[id number] DOB: 11-15-1955 DOA: 04/09/2020  PCP: Kerri Perches, PA-C   Patient coming from: Home  I have personally briefly reviewed patient's old medical records in [employer]  Chief Complaint: Mouth pain  HPI: Ronnie Ingram is a 64 y.o. 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  Patient reports pain when he opens his mouth to eat or yawn.  He has been on Magic mouthwash with no improvement in his pain. He was seen as an outpatient by his oncologist who added oral morphine with really no improvement. He denies having any fever or chills, he denies having any abdominal pain, no shortness of breath, no dizziness, no lightheadedness, no abdominal pain, no nausea or vomiting. Labs show sodium 133, potassium 4.1, chloride 100, bicarb 23, BUN 20, creatinine 0.78, calcium 9.1, alkaline phosphatase 83, albumin 3.9, lipase 25, AST 27, ALT 42, white count 7.1, hemoglobin 14.6, hematocrit 40.4, MCV 96.9, RDW 12.8, platelet count 176 Respiratory viral panel is negative   ED Course: Patient is a 64 year old male with a history of carcinoma of the oropharynx currently on radiation and chemotherapy who presents to the ER for evaluation of mouth pain.  Patient was diagnosed with chemotherapy-induced mucositis and started on Magic mouthwash as well as oral morphine without any improvement in his symptoms.  He will be admitted to the hospital for hydration and pain control   Review of Systems: As per HPI otherwise 10 point review of systems negative.    Past Medical History:  Diagnosis Date  . Arthritis   . Asthma   . Brain bleed ([place])    [place] years ago after falling off ladder  . COPD (chronic obstructive pulmonary disease) ([place])   . Diverticulitis   . Hypertension     . Tonsil cancer [facility])     Past Surgical History:  Procedure Laterality Date  . APPENDECTOMY    . FRACTURE SURGERY Left    ORIF left forearm  . HERNIA REPAIR Left    inguinal  . HERNIA REPAIR     abd  . INGUINAL HERNIA REPAIR Right 09/06/2018   Procedure: HERNIA REPAIR INGUINAL ADULT, RIGHT;  Surgeon: Herbert Pun, MD;  Location: [facility] ORS;  Service: General;  Laterality: Right;  . PORTA CATH INSERTION N/A 03/14/2020   Procedure: PORTA CATH INSERTION;  Surgeon: Algernon Huxley, MD;  Location: [place] [facility];  Service: Cardiovascular;  Laterality: N/A;     reports that he has been smoking cigarettes. He has a 15.00 pack-year smoking history. He has never used smokeless tobacco. He reports current alcohol use of about 12.0 standard drinks of alcohol per week. He reports current drug use. Drug: Marijuana.  No Known Allergies  No family history on file.   Prior to Admission medications   Medication Sig Start Date End Date Taking? Authorizing Provider  Clotrimazole 1 % OINT Apply a thin layer to the affected area twice daily as needed. Patient taking differently: Apply 1 application topically daily as needed (rash).  04/02/20  Yes Sindy Guadeloupe, MD  dexamethasone (DECADRON) 4 MG tablet Take 2 tablets (8 mg total) by mouth daily. Take daily x 3 days starting the day after cisplatin chemotherapy. Take with food. Patient taking differently: Take 8 mg by mouth as directed. Take daily x 3 days  starting the day after cisplatin chemotherapy. Take with food. 03/08/20  Yes Sindy Guadeloupe, MD  gabapentin (NEURONTIN) 300 MG capsule Take 300 mg by mouth 2 (two) times daily as needed (pain).    Yes [provider]  lidocaine-prilocaine (EMLA) cream Apply to affected area once 03/08/20  Yes Sindy Guadeloupe, MD  lisinopril (PRINIVIL,ZESTRIL) 40 MG tablet Take 40 mg by mouth daily.    Yes [provider]  magic mouthwash w/lidocaine SOLN Take 5 mLs by mouth 4 (four) times daily  as needed for mouth pain. 03/26/20  Yes Sindy Guadeloupe, MD  morphine (ROXANOL) 20 MG/ML concentrated solution Take 0.5 mLs (10 mg total) by mouth every 4 (four) hours as needed for severe pain. 04/02/20  Yes Sindy Guadeloupe, MD  tiotropium (SPIRIVA) 18 MCG inhalation capsule Place 18 mcg into inhaler and inhale daily.    Yes [provider]  LORazepam (ATIVAN) 0.5 MG tablet Take 1 tablet (0.5 mg total) by mouth every 6 (six) hours as needed (Nausea or vomiting). Patient not taking: Reported on 03/14/2020 03/08/20   Sindy Guadeloupe, MD  ondansetron (ZOFRAN) 8 MG tablet Take 1 tablet (8 mg total) by mouth 2 (two) times daily as needed. Start on the third day after cisplatin chemotherapy. Patient not taking: Reported on 03/14/2020 03/08/20   Sindy Guadeloupe, MD  prochlorperazine (COMPAZINE) 10 MG tablet Take 1 tablet (10 mg total) by mouth every 6 (six) hours as needed (Nausea or vomiting). Patient not taking: Reported on 03/14/2020 03/08/20   Sindy Guadeloupe, MD    Physical Exam: Vitals:   04/08/20 1449 04/08/20 1450 04/08/20 1825  BP: 105/60  (!) 128/95  Pulse: 81  74  Resp: 20  18  Temp: 98.5 F (36.9 C)  98.3 F (36.8 C)  TempSrc: Oral  Oral  SpO2: 96%  96%  Weight:  69 kg   Height:  5\' 9"  (1.753 m)      Vitals:   04/08/20 1449 04/08/20 1450 04/08/20 1825  BP: 105/60  (!) 128/95  Pulse: 81  74  Resp: 20  18  Temp: 98.5 F (36.9 C)  98.3 F (36.8 C)  TempSrc: Oral  Oral  SpO2: 96%  96%  Weight:  69 kg   Height:  5\' 9"  (1.753 m)     Constitutional: NAD, alert and oriented x 3 Eyes: PERRL, lids and conjunctivae normal ENMT: Mucous membranes are dry, no oral ulcers visualized Neck: normal, supple, no masses, no thyromegaly Respiratory: clear to auscultation bilaterally, no wheezing, no crackles. Normal respiratory effort. No accessory muscle use.  Cardiovascular: Regular rate and rhythm, no murmurs / rubs / gallops. No extremity edema. 2+ pedal pulses. No carotid bruits.    Abdomen: no tenderness, no masses palpated. No hepatosplenomegaly. Bowel sounds positive.  Musculoskeletal: no clubbing / cyanosis. No joint deformity upper and lower extremities.  Skin: no rashes, lesions, ulcers.  Neurologic: No gross focal neurologic deficit. Psychiatric: Normal mood and affect.   Labs on Admission: I have personally reviewed following labs and imaging studies  CBC: Recent Labs  Lab 04/06/20 1250 04/08/20 1453  WBC 6.9 7.1  NEUTROABS 4.5  --   HGB 15.3 14.6  HCT 42.0 40.4  MCV 97.2 96.9  PLT 179 161   Basic Metabolic Panel: Recent Labs  Lab 04/06/20 1250 04/08/20 1453  NA 137 133*  K 4.2 4.1  CL 100 100  CO2 28 23  GLUCOSE 113* 109*  BUN 25* 20  CREATININE 0.68 0.78  CALCIUM 8.7* 9.1   GFR: Estimated Creatinine Clearance: 93.4 mL/min (by C-G formula based on SCr of 0.78 mg/dL). Liver Function Tests: Recent Labs  Lab 04/06/20 1250 04/08/20 1453  AST 37 27  ALT 55* 42  ALKPHOS 69 83  BILITOT 0.8 1.1  PROT 7.2 7.5  ALBUMIN 3.7 3.9   Recent Labs  Lab 04/08/20 1453  LIPASE 25   No results for input(s): AMMONIA in the last 168 hours. Coagulation Profile: No results for input(s): INR, PROTIME in the last 168 hours. Cardiac Enzymes: No results for input(s): CKTOTAL, CKMB, CKMBINDEX, TROPONINI in the last 168 hours. BNP (last 3 results) No results for input(s): PROBNP in the last 8760 hours. HbA1C: No results for input(s): HGBA1C in the last 72 hours. CBG: No results for input(s): GLUCAP in the last 168 hours. Lipid Profile: No results for input(s): CHOL, HDL, LDLCALC, TRIG, CHOLHDL, LDLDIRECT in the last 72 hours. Thyroid Function Tests: No results for input(s): TSH, T4TOTAL, FREET4, T3FREE, THYROIDAB in the last 72 hours. Anemia Panel: No results for input(s): VITAMINB12, FOLATE, FERRITIN, TIBC, IRON, RETICCTPCT in the last 72 hours. Urine analysis:    Component Value Date/Time   COLORURINE YELLOW (A) 10/28/2016 2117    APPEARANCEUR CLEAR (A) 10/28/2016 2117   APPEARANCEUR Clear 11/09/2012 2116   LABSPEC 1.021 10/28/2016 2117   LABSPEC 1.023 11/09/2012 2116   PHURINE 5.0 10/28/2016 2117   GLUCOSEU NEGATIVE 10/28/2016 2117   GLUCOSEU Negative 11/09/2012 2116   HGBUR NEGATIVE 10/28/2016 2117   BILIRUBINUR NEGATIVE 10/28/2016 2117   BILIRUBINUR Negative 11/09/2012 2116   [place] NEGATIVE 10/28/2016 2117   PROTEINUR NEGATIVE 10/28/2016 2117   NITRITE NEGATIVE 10/28/2016 2117   LEUKOCYTESUR NEGATIVE 10/28/2016 2117   LEUKOCYTESUR Negative 11/09/2012 2116    Radiological Exams on Admission: No results found.  EKG: Independently reviewed.   Assessment/Plan Principal Problem:   Mucositis Active Problems:   Squamous cell carcinoma of oropharynx (HCC)   COPD (chronic obstructive pulmonary disease) (HCC)   Hypertension       Chemotherapy-induced mucositis Patient has a history of squamous cell carcinoma of the oropharynx and is currently on chemo and radiation therapy. He presents for evaluation of oral pain and has been unable to tolerate any oral intake Continue Magic mouthwash Continue oral morphine Keep patient n.p.o. for now IV fluid hydration IV PPI     COPD Not acutely exacerbated Continue Spiriva   Hypertension Hold lisinopril for now since patient is normotensive and unable to tolerate any oral intake   DVT prophylaxis: Lovenox Code Status: Full code Family Communication: Greater than 50% of time was spent discussing plan of care with patient at the bedside.  All questions and concerns have been addressed.  He verbalizes understanding and agrees with the plan. Disposition Plan: Back to previous home environment Consults called: Oncology    Collier Bullock MD [facility]     04/09/2020, 9:31 AM

## 2020-04-09 NOTE — ED Notes (Signed)
Pt up to bedside toilet. Visitor remains at bedside.

## 2020-04-10 ENCOUNTER — Ambulatory Visit: Payer: Medicare Other

## 2020-04-10 DIAGNOSIS — I10 Essential (primary) hypertension: Secondary | ICD-10-CM | POA: Diagnosis not present

## 2020-04-10 DIAGNOSIS — C109 Malignant neoplasm of oropharynx, unspecified: Secondary | ICD-10-CM | POA: Diagnosis not present

## 2020-04-10 DIAGNOSIS — K1231 Oral mucositis (ulcerative) due to antineoplastic therapy: Principal | ICD-10-CM

## 2020-04-10 DIAGNOSIS — K1230 Oral mucositis (ulcerative), unspecified: Secondary | ICD-10-CM | POA: Diagnosis not present

## 2020-04-10 DIAGNOSIS — J449 Chronic obstructive pulmonary disease, unspecified: Secondary | ICD-10-CM | POA: Diagnosis not present

## 2020-04-10 LAB — URINALYSIS, COMPLETE (UACMP) WITH MICROSCOPIC
Bacteria, UA: NONE SEEN
Bilirubin Urine: NEGATIVE
Glucose, UA: NEGATIVE mg/dL
Hgb urine dipstick: NEGATIVE
Ketones, ur: NEGATIVE mg/dL
Leukocytes,Ua: NEGATIVE
Nitrite: NEGATIVE
Protein, ur: NEGATIVE mg/dL
Specific Gravity, Urine: 1.026 (ref 1.005–1.030)
Squamous Epithelial / HPF: NONE SEEN (ref 0–5)
pH: 5 (ref 5.0–8.0)

## 2020-04-10 LAB — CBC
HCT: 37.3 % — ABNORMAL LOW (ref 39.0–52.0)
Hemoglobin: 13.3 g/dL (ref 13.0–17.0)
MCH: 35.1 pg — ABNORMAL HIGH (ref 26.0–34.0)
MCHC: 35.7 g/dL (ref 30.0–36.0)
MCV: 98.4 fL (ref 80.0–100.0)
Platelets: 148 10*3/uL — ABNORMAL LOW (ref 150–400)
RBC: 3.79 MIL/uL — ABNORMAL LOW (ref 4.22–5.81)
RDW: 12.8 % (ref 11.5–15.5)
WBC: 5.4 10*3/uL (ref 4.0–10.5)
nRBC: 0 % (ref 0.0–0.2)

## 2020-04-10 LAB — BASIC METABOLIC PANEL
Anion gap: 9 (ref 5–15)
BUN: 14 mg/dL (ref 8–23)
CO2: 23 mmol/L (ref 22–32)
Calcium: 8.8 mg/dL — ABNORMAL LOW (ref 8.9–10.3)
Chloride: 103 mmol/L (ref 98–111)
Creatinine, Ser: 0.75 mg/dL (ref 0.61–1.24)
GFR, Estimated: >60 mL/min (ref >60)
Glucose, Bld: 135 mg/dL — ABNORMAL HIGH (ref 70–99)
Potassium: 3.9 mmol/L (ref 3.5–5.1)
Sodium: 135 mmol/L (ref 135–145)

## 2020-04-10 LAB — URINE DRUG SCREEN, QUALITATIVE (ARMC ONLY)
Amphetamines, Ur Screen: NOT DETECTED
Barbiturates, Ur Screen: NOT DETECTED
Benzodiazepine, Ur Scrn: NOT DETECTED
Cannabinoid 50 Ng, Ur ~~LOC~~: POSITIVE — AB
Cocaine Metabolite,Ur ~~LOC~~: NOT DETECTED
MDMA (Ecstasy)Ur Screen: NOT DETECTED
Methadone Scn, Ur: NOT DETECTED
Opiate, Ur Screen: POSITIVE — AB
Phencyclidine (PCP) Ur S: NOT DETECTED
Tricyclic, Ur Screen: NOT DETECTED

## 2020-04-10 LAB — MRSA PCR SCREENING: MRSA by PCR: NEGATIVE

## 2020-04-10 LAB — HIV ANTIBODY (ROUTINE TESTING W REFLEX): HIV Screen 4th Generation wRfx: NONREACTIVE

## 2020-04-10 MED ORDER — PROMETHAZINE HCL 25 MG/ML IJ SOLN
12.5000 mg | Freq: Once | INTRAMUSCULAR | Status: AC
  Administered 2020-04-10: 12.5 mg via INTRAVENOUS
  Filled 2020-04-10: qty 1

## 2020-04-10 MED ORDER — ENSURE ENLIVE PO LIQD
237.0000 mL | Freq: Three times a day (TID) | ORAL | Status: DC
  Administered 2020-04-10: 237 mL via ORAL

## 2020-04-10 MED ORDER — CHLORHEXIDINE GLUCONATE CLOTH 2 % EX PADS
6.0000 | MEDICATED_PAD | Freq: Once | CUTANEOUS | Status: AC
  Administered 2020-04-11: 6 via TOPICAL

## 2020-04-10 MED ORDER — SODIUM CHLORIDE 0.9 % IV SOLN
2.0000 g | INTRAVENOUS | Status: AC
  Administered 2020-04-11: 2 g via INTRAVENOUS
  Filled 2020-04-10: qty 2

## 2020-04-10 MED ORDER — TRAZODONE HCL 50 MG PO TABS
50.0000 mg | ORAL_TABLET | Freq: Every day | ORAL | Status: DC
  Administered 2020-04-10: 50 mg via ORAL
  Filled 2020-04-10: qty 1

## 2020-04-10 MED ORDER — MAGNESIUM SULFATE 2 GM/50ML IV SOLN
2.0000 g | Freq: Once | INTRAVENOUS | Status: AC
  Administered 2020-04-10: 2 g via INTRAVENOUS
  Filled 2020-04-10: qty 50

## 2020-04-10 MED ORDER — CHLORHEXIDINE GLUCONATE CLOTH 2 % EX PADS: 6.0000 | MEDICATED_PAD | Freq: Once | CUTANEOUS | Status: DC

## 2020-04-10 MED ORDER — FLUCONAZOLE IN SODIUM CHLORIDE 200-0.9 MG/100ML-% IV SOLN
200.0000 mg | INTRAVENOUS | Status: DC
  Administered 2020-04-10 – 2020-04-12 (×3): 200 mg via INTRAVENOUS
  Filled 2020-04-10 (×4): qty 100

## 2020-04-10 NOTE — Consult Note (Signed)
Pharmacy Antibiotic Note  Ronnie Ingram is a 64 y.o. male admitted on 04/09/2020 with .medical history significant squamous cell carcinoma of the oropharynx currently on radiation therapy and chemotherapy who reports to the emergency room for evaluation of pain in his mouth and difficulty swallowing with a diagnosis of oropharyngeal candidiasis.  Pharmacy has been consulted for fluconazole dosing.  Plan:   Start fluconazole 200 mg IV once daily  Height: 5\' 9"  (175.3 cm) Weight: 66.2 kg (145 lb 15.1 oz) IBW/kg (Calculated) : 70.7  Temp (24hrs), Avg:98.4 F (36.9 C), Min:98 F (36.7 C), Max:98.7 F (37.1 C)  Recent Labs  Lab 04/06/20 1250 04/08/20 1453 04/10/20 0542  WBC 6.9 7.1 5.4  CREATININE 0.68 0.78 0.75    Estimated Creatinine Clearance: 89.6 mL/min (by C-G formula based on SCr of 0.75 mg/dL).    No Known Allergies  Antimicrobials this admission: fluconazole 10/19 >>   Microbiology results: 10/18 SARS CoV-2: negative  10/18 influenza A/B: negative  Thank you for allowing pharmacy to be a part of this patient's care.  Dallie Piles 04/10/2020 3:09 PM

## 2020-04-10 NOTE — Treatment Plan (Signed)
Consent signed for G tube placement at this time. Pt states he just's wants to be left alone so he can sleep. CHG bath deferred due to refusal at this time.

## 2020-04-10 NOTE — Progress Notes (Signed)
Patient ID: Ronnie Ingram, male   DOB: 09/06/1955, 64 y.o.   MRN: [id number]  Patient refused talking with me this morning and asked me to come back later.  I came back and looked in the room and the patient was sleeping so I did not wake him up.  I reviewed the plan and laboratory and radiological data.  Dr Loletha Grayer

## 2020-04-10 NOTE — Treatment Plan (Signed)
UDS collected and sent to lab by writer at this time. 30cc sent.

## 2020-04-10 NOTE — Progress Notes (Signed)
Patient ID: Ronnie Ingram, male   DOB: [birth year], 64 y.o.   MRN: [id number] [facility] PROGRESS NOTE  DIONISIOS RICCI [id number] DOB: Nov 08, 1955 DOA: 04/09/2020 PCP: Kerri Perches, PA-C  HPI/Subjective: Patient wanted me to come back later.  This afternoon when I came back to see him he was doing better with regards to his headache this morning.  I got paged early about severe headache.  Patient states he is able to swallow but sometimes can be very painful.  He is going to have the PEG procedure tomorrow.  Objective: Vitals:   04/10/20 0718 04/10/20 1129  BP: (!) 142/72 115/71  Pulse: 60 (!) 57  Resp: 18   Temp: 98.2 F (36.8 C) 98.7 F (37.1 C)  SpO2: 96% 96%    Intake/Output Summary (Last 24 hours) at 04/10/2020 1457 Last data filed at 04/10/2020 1200 Gross per 24 hour  Intake 1670.82 ml  Output 1500 ml  Net 170.82 ml   Filed Weights   04/08/20 1450 04/09/20 1808  Weight: 69 kg 66.2 kg    ROS: Review of Systems  HENT: Positive for sore throat.   Respiratory: Negative for cough and shortness of breath.   Cardiovascular: Negative for chest pain.  Gastrointestinal: Negative for abdominal pain, nausea and vomiting.   Exam: Physical Exam HENT:     Head: Normocephalic.     Mouth/Throat:     Comments: Back of the throat swollen and uvula swollen slight erythema slight whitish areas. Eyes:     General: Lids are normal.     Conjunctiva/sclera: Conjunctivae normal.     Pupils: Pupils are equal, round, and reactive to light.  Cardiovascular:     Rate and Rhythm: Normal rate and regular rhythm.     Heart sounds: Normal heart sounds, S1 normal and S2 normal.  Pulmonary:     Breath sounds: No decreased breath sounds, wheezing, rhonchi or rales.  Abdominal:     Palpations: Abdomen is soft.     Tenderness: There is no abdominal tenderness.  Musculoskeletal:     Right lower leg: No swelling.     Left lower leg: No swelling.  Skin:    General: Skin is warm.      Findings: No rash.  Neurological:     Mental Status: He is alert and oriented to person, place, and time.       Data Reviewed: Basic Metabolic Panel: Recent Labs  Lab 04/06/20 1250 04/08/20 1453 04/10/20 0542  NA 137 133* 135  K 4.2 4.1 3.9  CL 100 100 103  CO2 28 23 23   GLUCOSE 113* 109* 135*  BUN 25* 20 14  CREATININE 0.68 0.78 0.75  CALCIUM 8.7* 9.1 8.8*   Liver Function Tests: Recent Labs  Lab 04/06/20 1250 04/08/20 1453  AST 37 27  ALT 55* 42  ALKPHOS 69 83  BILITOT 0.8 1.1  PROT 7.2 7.5  ALBUMIN 3.7 3.9   Recent Labs  Lab 04/08/20 1453  LIPASE 25   CBC: Recent Labs  Lab 04/06/20 1250 04/08/20 1453 04/10/20 0542  WBC 6.9 7.1 5.4  NEUTROABS 4.5  --   --   HGB 15.3 14.6 13.3  HCT 42.0 40.4 37.3*  MCV 97.2 96.9 98.4  PLT 179 176 148*     Recent Results (from the past 240 hour(s))  Respiratory Panel by RT PCR (Flu A&B, Covid) - Nasopharyngeal Swab     Status: None   Collection Time: 04/09/20  6:00 AM   Specimen: Nasopharyngeal  Swab  Result Value Ref Range Status   SARS Coronavirus 2 by RT PCR NEGATIVE NEGATIVE Final    Comment: (NOTE) SARS-CoV-2 target nucleic acids are NOT DETECTED.  The SARS-CoV-2 RNA is generally detectable in upper respiratoy specimens during the acute phase of infection. The lowest concentration of SARS-CoV-2 viral copies this assay can detect is 131 copies/mL. A negative result does not preclude SARS-Cov-2 infection and should not be used as the sole basis for treatment or other patient management decisions. A negative result may occur with  improper specimen collection/handling, submission of specimen other than nasopharyngeal swab, presence of viral mutation(s) within the areas targeted by this assay, and inadequate number of viral copies (<131 copies/mL). A negative result must be combined with clinical observations, patient history, and epidemiological information. The expected result is Negative.  Fact Sheet  for Patients:  PinkCheek.be  Fact Sheet for Healthcare Providers:  GravelBags.it  This test is no t yet approved or cleared by the [place] FDA and  has been authorized for detection and/or diagnosis of SARS-CoV-2 by FDA under an Emergency Use Authorization (EUA). This EUA will remain  in effect (meaning this test can be used) for the duration of the COVID-19 declaration under Section 564(b)(1) of the Act, 21 U.S.C. section 360bbb-3(b)(1), unless the authorization is terminated or revoked sooner.     Influenza A by PCR NEGATIVE NEGATIVE Final   Influenza B by PCR NEGATIVE NEGATIVE Final    Comment: (NOTE) The Xpert Xpress SARS-CoV-2/FLU/RSV assay is intended as an aid in  the diagnosis of influenza from Nasopharyngeal swab specimens and  should not be used as a sole basis for treatment. Nasal washings and  aspirates are unacceptable for Xpert Xpress SARS-CoV-2/FLU/RSV  testing.  Fact Sheet for Patients: PinkCheek.be  Fact Sheet for Healthcare Providers: GravelBags.it  This test is not yet approved or cleared by the [place] FDA and  has been authorized for detection and/or diagnosis of SARS-CoV-2 by  FDA under an Emergency Use Authorization (EUA). This EUA will remain  in effect (meaning this test can be used) for the duration of the  Covid-19 declaration under Section 564(b)(1) of the Act, 21  U.S.C. section 360bbb-3(b)(1), unless the authorization is  terminated or revoked. Performed at [facility], [address] [place], [place] [address]       Scheduled Meds: . Chlorhexidine Gluconate Cloth  6 each Topical Once   And  . Chlorhexidine Gluconate Cloth  6 each Topical Once  . dexamethasone  8 mg Oral Daily  . enoxaparin (LOVENOX) injection  40 mg Subcutaneous Q24H  . feeding supplement  237 mL Oral TID BM  . lidocaine-prilocaine    Topical Once  . mouth rinse  15 mL Mouth Rinse BID  . pantoprazole (PROTONIX) IV  40 mg Intravenous Q24H  . tiotropium  18 mcg Inhalation Daily   Continuous Infusions: . [START ON 04/11/2020] cefoTEtan (CEFOTAN) IV    . dextrose 5 % and 0.45% NaCl 125 mL/hr at 04/10/20 1430    Assessment/Plan:  1. Chemotherapy-induced mucositis.  Patient receiving chemo and radiation therapy for tonsillar cancer.  Able to tolerate liquids.  Brought in for a PEG feeding tube tomorrow.  We will give IV Diflucan to see if this makes a difference. 2. COPD on Spiriva.  Respiratory status stable. 3. Essential hypertension.  Blood pressure stable lisinopril. 4. Headache this morning given IV magnesium and IV Phenergan and headache has improved.      Code Status:  Code Status Orders  (From admission, onward)         Start     Ordered   04/09/20 0912  Full code  Continuous        04/09/20 0913        Code Status History    This patient has a current code status but no historical code status.   Advance Care Planning Activity     Disposition Plan: Status is: Inpatient  Dispo: The patient is from: Home              Anticipated d/c is to: Home              Anticipated d/c date is: 04/12/2020 versus 04/13/2021              Patient currently being treated for mucositis and going to receive a PEG placement tomorrow.  Consultants:  General surgery  Oncology  Antibiotics:  Start IV Diflucan  Time spent: 28 minutes  [place]

## 2020-04-10 NOTE — Consult Note (Addendum)
Patient ID: Ronnie Ingram, male   DOB: 04/12/1956, 64 y.o.   MRN: [id number]  HPI Ronnie Ingram is a 64 y.o. male seen in consultation at the request of Dr. Janese Banks with history of head and neck cancer undergoing chemoradiation.  He has significant swallowing issues and continues to lose weight.  Does have significant history of carcinoma of the oropharynx.  He is currently drinking some full liquids with some Ensure but has difficulty swallowing.  He states that he has pain when swallowing.  He was hospitalized yesterday for failure to thrive and dehydration.  He feels a little bit better.  He feels exhausted and very weak.   prior abdominal operation includes an appendectomy, ventral hernia and he also had a prior inguinal hernia repair. He smokes daily. PET CT scan  personally reviewed COPD, prior diverticulosis .AAA. Left cervical LAD + weight loss Labs show sodium 133, potassium 4.1, chloride 100, bicarb 23, BUN 20, creatinine 0.78, calcium 9.1, alkaline phosphatase 83, albumin 3.9, lipase 25, AST 27, ALT 42, white count 7.1, hemoglobin 14.6, hematocrit 40.4, MCV 96.9, RDW 12.8, platelet count 176 Case d/w Dr. Janese Banks in detail HPI  Past Medical History:  Diagnosis Date  . Arthritis   . Asthma   . Brain bleed ([place])    [place] years ago after falling off ladder  . COPD (chronic obstructive pulmonary disease) ([place])   . Diverticulitis   . Hypertension   . Tonsil cancer [facility])     Past Surgical History:  Procedure Laterality Date  . APPENDECTOMY    . FRACTURE SURGERY Left    ORIF left forearm  . HERNIA REPAIR Left    inguinal  . HERNIA REPAIR     abd  . INGUINAL HERNIA REPAIR Right 09/06/2018   Procedure: HERNIA REPAIR INGUINAL ADULT, RIGHT;  Surgeon: Herbert Pun, MD;  Location: [facility] ORS;  Service: General;  Laterality: Right;  . PORTA CATH INSERTION N/A 03/14/2020   Procedure: PORTA CATH INSERTION;  Surgeon: Algernon Huxley, MD;  Location: [place] [facility];  Service:  Cardiovascular;  Laterality: N/A;    No significant of fam hx.   Social History Social History   Tobacco Use  . Smoking status: Current Every Day Smoker    Packs/day: 0.50    Years: 30.00    Pack years: 15.00    Types: Cigarettes  . Smokeless tobacco: Never Used  Vaping Use  . Vaping Use: Never used  Substance Use Topics  . Alcohol use: Yes    Alcohol/week: 12.0 standard drinks    Types: 12 Cans of beer per week  . Drug use: Yes    Types: Marijuana    No Known Allergies  Current Facility-Administered Medications  Medication Dose Route Frequency Provider Last Rate Last Admin  . dexamethasone (DECADRON) tablet 8 mg  8 mg Oral Daily Judd Gaudier V, MD      . dextrose 5 %-0.45 % sodium chloride infusion   Intravenous Continuous Gregor Hams, MD 125 mL/hr at 04/10/20 0754 New Bag at 04/10/20 0754  . enoxaparin (LOVENOX) injection 40 mg  40 mg Subcutaneous Q24H Agbata, Tochukwu, MD      . gabapentin (NEURONTIN) capsule 300 mg  300 mg Oral BID PRN Agbata, Tochukwu, MD      . lidocaine-prilocaine (EMLA) cream   Topical Once Agbata, Tochukwu, MD      . magic mouthwash w/lidocaine  5 mL Oral QID PRN Collier Bullock, MD      . MEDLINE  mouth rinse  15 mL Mouth Rinse BID Athena Masse, MD   15 mL at 04/09/20 2052  . morphine CONCENTRATE 10 MG/0.5ML oral solution 10 mg  10 mg Oral Q4H PRN Agbata, Tochukwu, MD   10 mg at 04/10/20 6384  . ondansetron (ZOFRAN) tablet 4 mg  4 mg Oral Q6H PRN Agbata, Tochukwu, MD       Or  . ondansetron (ZOFRAN) injection 4 mg  4 mg Intravenous Q6H PRN Agbata, Tochukwu, MD      . pantoprazole (PROTONIX) injection 40 mg  40 mg Intravenous Q24H Agbata, Tochukwu, MD   40 mg at 04/10/20 0720  . tiotropium (SPIRIVA) inhalation capsule ([facility] use ONLY) 18 mcg  18 mcg Inhalation Daily Agbata, Tochukwu, MD   18 mcg at 04/09/20 1211     Review of Systems Full ROS  was asked and was negative except for the information on the HPI  Physical Exam Blood  pressure (!) 142/72, pulse 60, temperature 98.2 F (36.8 C), temperature source Oral, resp. rate 18, height 5\' 9"  (1.753 m), weight 66.2 kg, SpO2 96 %. CONSTITUTIONAL: NAD EYES: Pupils are equal, round,  Sclera are non-icteric. EARS, NOSE, MOUTH AND THROAT: Wearing  Masks, Hearing is intact to voice. LYMPH NODES:  Lymph nodes in the neck are normal. RESPIRATORY:  Lungs are clear. There is normal respiratory effort, with equal breath sounds bilaterally, and without pathologic use of accessory muscles. CARDIOVASCULAR: Heart is regular without murmurs, gallops, or rubs. GI: The abdomen is  soft, nontender, and nondistended. There are no palpable masses. There is no hepatosplenomegaly. There are normal bowel sounds in all quadrants. GU: Rectal deferred.   MUSCULOSKELETAL: Normal muscle strength and tone. No cyanosis or edema.   SKIN: Turgor is good and there are no pathologic skin lesions or ulcers. NEUROLOGIC: Motor and sensation is grossly normal. Cranial nerves are grossly intact. PSYCH:  Oriented to person, place and time. Affect is normal.  Data Reviewed  I have personally reviewed the patient's imaging, laboratory findings and medical records.    Assessment/Plan 64 year old male with head and neck cancer with metastasis and significant malnutrition and difficulty swallowing in need for a G-tube.  I do think that he is a good candidate for robotic approach.  Procedure discussed with patient detail.  Risks, benefits and possible complications including but not limited to: Bleeding, infection, bowel injuries, malfunctioning and potential revision.  He understands and wishes to proceed. Plan for OR tomorrow. Extensive counseling provided D/W wife extensively. She understands.   Caroleen Hamman, MD FACS General Surgeon 04/10/2020, 9:17 AM

## 2020-04-10 NOTE — Progress Notes (Signed)
Hematology/Oncology Consult note [facility]  Telephone:([phone] [phone]  Patient Care Team: Renette Butters as PCP - General (Physician Assistant)   Name of the patient: Ronnie Ingram  [id number]  22-May-1959   Date of visit: 04/10/2020   Interval history- he is tolerating full liquid diet well. Pain is well controlled  ECOG PS- 2 Pain scale- 3 Opioid associated constipation- no  Review of systems- Review of Systems  Constitutional: Positive for malaise/fatigue and weight loss. Negative for chills and fever.  HENT: Negative for congestion, ear discharge and nosebleeds.        Throat pain  Eyes: Negative for blurred vision.  Respiratory: Negative for cough, hemoptysis, sputum production, shortness of breath and wheezing.   Cardiovascular: Negative for chest pain, palpitations, orthopnea and claudication.  Gastrointestinal: Negative for abdominal pain, blood in stool, constipation, diarrhea, heartburn, melena, nausea and vomiting.  Genitourinary: Negative for dysuria, flank pain, frequency, hematuria and urgency.  Musculoskeletal: Negative for back pain, joint pain and myalgias.  Skin: Negative for rash.  Neurological: Negative for dizziness, tingling, focal weakness, seizures, weakness and headaches.  Endo/Heme/Allergies: Does not bruise/bleed easily.  Psychiatric/Behavioral: Negative for depression and suicidal ideas. The patient does not have insomnia.        No Known Allergies   Past Medical History:  Diagnosis Date  . Arthritis   . Asthma   . Brain bleed ([place])    [place] years ago after falling off ladder  . COPD (chronic obstructive pulmonary disease) ([place])   . Diverticulitis   . Hypertension   . Tonsil cancer [facility])      Past Surgical History:  Procedure Laterality Date  . APPENDECTOMY    . FRACTURE SURGERY Left    ORIF left forearm  . HERNIA REPAIR Left    inguinal  . HERNIA REPAIR     abd  . INGUINAL  HERNIA REPAIR Right 09/06/2018   Procedure: HERNIA REPAIR INGUINAL ADULT, RIGHT;  Surgeon: Herbert Pun, MD;  Location: [facility] ORS;  Service: General;  Laterality: Right;  . PORTA CATH INSERTION N/A 03/14/2020   Procedure: PORTA CATH INSERTION;  Surgeon: Algernon Huxley, MD;  Location: [place] CV LAB;  Service: Cardiovascular;  Laterality: N/A;    Social History   Socioeconomic History  . Marital status: Significant Other    Spouse name: Not on file  . Number of children: Not on file  . Years of education: Not on file  . Highest education level: Not on file  Occupational History  . Not on file  Tobacco Use  . Smoking status: Current Every Day Smoker    Packs/day: 0.50    Years: 30.00    Pack years: 15.00    Types: Cigarettes  . Smokeless tobacco: Never Used  Vaping Use  . Vaping Use: Never used  Substance and Sexual Activity  . Alcohol use: Yes    Alcohol/week: 12.0 standard drinks    Types: 12 Cans of beer per week  . Drug use: Yes    Types: Marijuana  . Sexual activity: Not on file  Other Topics Concern  . Not on file  Social History Narrative  . Not on file   Social Determinants of Health   Financial Resource Strain:   . Difficulty of Paying Living Expenses: Not on file  Food Insecurity:   . Worried About Charity fundraiser in the Last Year: Not on file  . Ran Out of Food in the Last Year:  Not on file  Transportation Needs:   . Lack of Transportation (Medical): Not on file  . Lack of Transportation (Non-Medical): Not on file  Physical Activity:   . Days of Exercise per Week: Not on file  . Minutes of Exercise per Session: Not on file  Stress:   . Feeling of Stress : Not on file  Social Connections:   . Frequency of Communication with Friends and Family: Not on file  . Frequency of Social Gatherings with Friends and Family: Not on file  . Attends Religious Services: Not on file  . Active Member of Clubs or Organizations: Not on file  . Attends English as a second language teacher Meetings: Not on file  . Marital Status: Not on file  Intimate Partner Violence:   . Fear of Current or Ex-Partner: Not on file  . Emotionally Abused: Not on file  . Physically Abused: Not on file  . Sexually Abused: Not on file    No family history on file.   Current Facility-Administered Medications:  .  [START ON 04/11/2020] cefoTEtan (CEFOTAN) 2 g in sodium chloride 0.9 % 100 mL IVPB, 2 g, Intravenous, On Call to OR, Pabon, [place], MD .  6 CHG cloth bath night before surgery, , , Once **AND** [START ON 04/11/2020] 6 CHG cloth bath AM of surgery, , , Once **AND** Chlorhexidine Gluconate Cloth 2 % PADS 6 each, 6 each, Topical, Once **AND** Chlorhexidine Gluconate Cloth 2 % PADS 6 each, 6 each, Topical, Once, Pabon, Diego F, MD .  dexamethasone (DECADRON) tablet 8 mg, 8 mg, Oral, Daily, Damita Dunnings, Hazel V, MD .  dextrose 5 %-0.45 % sodium chloride infusion, , Intravenous, Continuous, Gregor Hams, MD, Last Rate: 125 mL/hr at 04/10/20 0754, New Bag at 04/10/20 0754 .  enoxaparin (LOVENOX) injection 40 mg, 40 mg, Subcutaneous, Q24H, Agbata, Tochukwu, MD .  feeding supplement (ENSURE ENLIVE / ENSURE PLUS) liquid 237 mL, 237 mL, Oral, TID BM, Wieting, Richard, MD .  gabapentin (NEURONTIN) capsule 300 mg, 300 mg, Oral, BID PRN, Agbata, Tochukwu, MD .  lidocaine-prilocaine (EMLA) cream, , Topical, Once, Agbata, Tochukwu, MD .  magic mouthwash w/lidocaine, 5 mL, Oral, QID PRN, Agbata, Tochukwu, MD .  MEDLINE mouth rinse, 15 mL, Mouth Rinse, BID, Athena Masse, MD, 15 mL at 04/09/20 2052 .  morphine CONCENTRATE 10 MG/0.5ML oral solution 10 mg, 10 mg, Oral, Q4H PRN, Agbata, Tochukwu, MD, 10 mg at 04/10/20 0623 .  ondansetron (ZOFRAN) tablet 4 mg, 4 mg, Oral, Q6H PRN **OR** ondansetron (ZOFRAN) injection 4 mg, 4 mg, Intravenous, Q6H PRN, Agbata, Tochukwu, MD .  pantoprazole (PROTONIX) injection 40 mg, 40 mg, Intravenous, Q24H, Agbata, Tochukwu, MD, 40 mg at 04/10/20 0720 .   tiotropium (SPIRIVA) inhalation capsule ([facility] use ONLY) 18 mcg, 18 mcg, Inhalation, Daily, Agbata, Tochukwu, MD, 18 mcg at 04/09/20 1211  Physical exam:  Vitals:   04/09/20 2308 04/10/20 0421 04/10/20 0718 04/10/20 1129  BP: 123/79 128/73 (!) 142/72 115/71  Pulse: (!) 58 61 60 (!) 57  Resp: 17 18 18    Temp: 98 F (36.7 C) 98.4 F (36.9 C) 98.2 F (36.8 C) 98.7 F (37.1 C)  TempSrc: Oral Oral Oral   SpO2: 97% 98% 96% 96%  Weight:      Height:       Physical Exam Constitutional:      General: He is not in acute distress.    Comments: Appears fatigued  HENT:     Mouth/Throat:  Comments: Grade 2 mucositis Cardiovascular:     Rate and Rhythm: Normal rate and regular rhythm.     Heart sounds: Normal heart sounds.  Pulmonary:     Effort: Pulmonary effort is normal.     Breath sounds: Normal breath sounds.  Abdominal:     General: Bowel sounds are normal.     Palpations: Abdomen is soft.  Skin:    General: Skin is warm and dry.  Neurological:     Mental Status: He is alert and oriented to person, place, and time.      CMP Latest Ref Rng & Units 04/10/2020  Glucose 70 - 99 mg/dL 135(H)  BUN 8 - 23 mg/dL 14  Creatinine 0.61 - 1.24 mg/dL 0.75  Sodium 135 - 145 mmol/L 135  Potassium 3.5 - 5.1 mmol/L 3.9  Chloride 98 - 111 mmol/L 103  CO2 22 - 32 mmol/L 23  Calcium 8.9 - 10.3 mg/dL 8.8(L)  Total Protein 6.5 - 8.1 g/dL -  Total Bilirubin 0.3 - 1.2 mg/dL -  Alkaline Phos 38 - 126 U/L -  AST 15 - 41 U/L -  ALT 0 - 44 U/L -   CBC Latest Ref Rng & Units 04/10/2020  WBC 4.0 - 10.5 K/uL 5.4  Hemoglobin 13.0 - 17.0 g/dL 13.3  Hematocrit 39 - 52 % 37.3(L)  Platelets 150 - 400 K/uL 148(L)    @IMAGES @  PERIPHERAL VASCULAR CATHETERIZATION  Result Date: 03/14/2020 See op note    Assessment and plan- Patient is a 64 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 Continue prn morphine.  Chemo/RT presently on hold  Failure to thrive- plan for peg tube tomorrow by DR. Pabon   Visit Diagnosis 1. Mucositis due to chemotherapy      Dr. Randa Evens, MD, MPH [facility] [phone] 04/10/2020 1:01 PM

## 2020-04-10 NOTE — Anesthesia Preprocedure Evaluation (Addendum)
Anesthesia Evaluation  Patient identified by MRN, date of birth, ID band Patient awake    Reviewed: Allergy & Precautions, H&P , NPO status , Patient's Chart, lab work & pertinent test results, reviewed documented beta blocker date and time   Airway Mallampati: II  TM Distance: >3 FB Neck ROM: full    Dental  (+) Teeth Intact   Pulmonary asthma , COPD, Current Smoker and Patient abstained from smoking.,    Pulmonary exam normal        Cardiovascular Exercise Tolerance: Poor hypertension, On Medications negative cardio ROS Normal cardiovascular exam Rhythm:regular Rate:Normal     Neuro/Psych negative neurological ROS  negative psych ROS   GI/Hepatic negative GI ROS, Neg liver ROS,   Endo/Other  negative endocrine ROS  Renal/GU negative Renal ROS  negative genitourinary   Musculoskeletal   Abdominal   Peds  Hematology negative hematology ROS (+)   Anesthesia Other Findings Past Medical History: No date: Arthritis No date: Asthma No date: Brain bleed ([facility])     Comment:  severak years ago after falling off ladder No date: COPD (chronic obstructive pulmonary disease) ([facility]) No date: Diverticulitis No date: Hypertension No date: Tonsil cancer ([place])   Reproductive/Obstetrics negative OB ROS                            Anesthesia Physical Anesthesia Plan  ASA: III  Anesthesia Plan: General ETT   Post-op Pain Management:    Induction:   PONV Risk Score and Plan: 2  Airway Management Planned:   Additional Equipment:   Intra-op Plan:   Post-operative Plan:   Informed Consent: I have reviewed the patients History and Physical, chart, labs and discussed the procedure including the risks, benefits and alternatives for the proposed anesthesia with the patient or authorized representative who has indicated his/her understanding and acceptance.     Dental Advisory Given  Plan  Discussed with: CRNA  Anesthesia Plan Comments:         Anesthesia Quick Evaluation

## 2020-04-10 NOTE — Clinical Social Work Note (Signed)
Per RN and MD in progression rounds, plan for PEG placement tomorrow. CSW sent information to [place] representative to plan for home tube feeds.  [place] Scrape, Nect[place]

## 2020-04-11 ENCOUNTER — Inpatient Hospital Stay: Payer: Medicare Other | Admitting: Anesthesiology

## 2020-04-11 ENCOUNTER — Encounter
Admission: EM | Disposition: A | Discharge status: Home / Self Care | Payer: Self-pay | Source: Home / Self Care | Attending: Internal Medicine

## 2020-04-11 ENCOUNTER — Encounter: Payer: Self-pay | Admitting: Internal Medicine

## 2020-04-11 ENCOUNTER — Ambulatory Visit: Payer: Medicare Other

## 2020-04-11 ENCOUNTER — Other Ambulatory Visit: Payer: Self-pay

## 2020-04-11 DIAGNOSIS — K1230 Oral mucositis (ulcerative), unspecified: Secondary | ICD-10-CM | POA: Diagnosis not present

## 2020-04-11 SURGERY — INSERTION, JEJUNOSTOMY TUBE, ROBOT-ASSISTED
Anesthesia: General

## 2020-04-11 MED ORDER — ROCURONIUM BROMIDE 10 MG/ML (PF) SYRINGE
PREFILLED_SYRINGE | INTRAVENOUS | Status: AC
  Filled 2020-04-11: qty 10

## 2020-04-11 MED ORDER — SUCCINYLCHOLINE CHLORIDE 20 MG/ML IJ SOLN
INTRAMUSCULAR | Status: DC | PRN
  Administered 2020-04-11: 100 mg via INTRAVENOUS

## 2020-04-11 MED ORDER — BUPIVACAINE-EPINEPHRINE 0.25% -1:200000 IJ SOLN
INTRAMUSCULAR | Status: DC | PRN
  Administered 2020-04-11: 20 mL

## 2020-04-11 MED ORDER — MIDAZOLAM HCL 2 MG/2ML IJ SOLN
INTRAMUSCULAR | Status: AC
  Filled 2020-04-11: qty 2

## 2020-04-11 MED ORDER — BUPIVACAINE LIPOSOME 1.3 % IJ SUSP
INTRAMUSCULAR | Status: DC | PRN
  Administered 2020-04-11: 20 mL

## 2020-04-11 MED ORDER — HYDROXYZINE HCL 25 MG PO TABS
25.0000 mg | ORAL_TABLET | Freq: Once | ORAL | Status: AC
  Administered 2020-04-11: 25 mg via ORAL
  Filled 2020-04-11: qty 1

## 2020-04-11 MED ORDER — DEXAMETHASONE SODIUM PHOSPHATE 10 MG/ML IJ SOLN
INTRAMUSCULAR | Status: AC
  Filled 2020-04-11: qty 1

## 2020-04-11 MED ORDER — ACETAMINOPHEN 10 MG/ML IV SOLN
INTRAVENOUS | Status: AC
  Filled 2020-04-11: qty 100

## 2020-04-11 MED ORDER — TRAMADOL HCL 50 MG PO TABS
50.0000 mg | ORAL_TABLET | Freq: Four times a day (QID) | ORAL | Status: DC | PRN
  Administered 2020-04-11: 50 mg via ORAL
  Filled 2020-04-11: qty 1

## 2020-04-11 MED ORDER — DEXMEDETOMIDINE (PRECEDEX) IN NS 20 MCG/5ML (4 MCG/ML) IV SYRINGE
PREFILLED_SYRINGE | INTRAVENOUS | Status: DC | PRN
  Administered 2020-04-11: 4 ug via INTRAVENOUS

## 2020-04-11 MED ORDER — GLYCOPYRROLATE 0.2 MG/ML IJ SOLN
INTRAMUSCULAR | Status: DC | PRN
  Administered 2020-04-11: .2 mg via INTRAVENOUS

## 2020-04-11 MED ORDER — LIDOCAINE HCL (PF) 2 % IJ SOLN
INTRAMUSCULAR | Status: AC
  Filled 2020-04-11: qty 5

## 2020-04-11 MED ORDER — KETOROLAC TROMETHAMINE 30 MG/ML IJ SOLN
30.0000 mg | Freq: Four times a day (QID) | INTRAMUSCULAR | Status: DC
  Administered 2020-04-11 – 2020-04-13 (×7): 30 mg via INTRAVENOUS
  Filled 2020-04-11 (×8): qty 1

## 2020-04-11 MED ORDER — DEXMEDETOMIDINE (PRECEDEX) IN NS 20 MCG/5ML (4 MCG/ML) IV SYRINGE
PREFILLED_SYRINGE | INTRAVENOUS | Status: DC | PRN
  Administered 2020-04-11 (×2): 4 ug via INTRAVENOUS

## 2020-04-11 MED ORDER — LIDOCAINE HCL (CARDIAC) PF 100 MG/5ML IV SOSY
PREFILLED_SYRINGE | INTRAVENOUS | Status: DC | PRN
  Administered 2020-04-11: 60 mg via INTRAVENOUS

## 2020-04-11 MED ORDER — SODIUM CHLORIDE 0.9 % IV SOLN
INTRAVENOUS | Status: DC | PRN
  Administered 2020-04-11: 10 ug/min via INTRAVENOUS

## 2020-04-11 MED ORDER — EPHEDRINE 5 MG/ML INJ
INTRAVENOUS | Status: AC
  Filled 2020-04-11: qty 20

## 2020-04-11 MED ORDER — SUGAMMADEX SODIUM 200 MG/2ML IV SOLN
INTRAVENOUS | Status: DC | PRN
  Administered 2020-04-11: 200 mg via INTRAVENOUS

## 2020-04-11 MED ORDER — FENTANYL CITRATE (PF) 100 MCG/2ML IJ SOLN
INTRAMUSCULAR | Status: DC | PRN
Start: 2020-04-11 — End: 2020-04-11
  Administered 2020-04-11 (×2): 25 ug via INTRAVENOUS
  Administered 2020-04-11: 50 ug via INTRAVENOUS

## 2020-04-11 MED ORDER — ONDANSETRON HCL 4 MG/2ML IJ SOLN
INTRAMUSCULAR | Status: DC | PRN
  Administered 2020-04-11: 4 mg via INTRAVENOUS

## 2020-04-11 MED ORDER — ONDANSETRON HCL 4 MG/2ML IJ SOLN
INTRAMUSCULAR | Status: AC
  Filled 2020-04-11: qty 2

## 2020-04-11 MED ORDER — BUPIVACAINE LIPOSOME 1.3 % IJ SUSP
INTRAMUSCULAR | Status: AC
  Filled 2020-04-11: qty 20

## 2020-04-11 MED ORDER — GLYCOPYRROLATE 0.2 MG/ML IJ SOLN: INTRAMUSCULAR | Status: DC | PRN

## 2020-04-11 MED ORDER — ROCURONIUM BROMIDE 100 MG/10ML IV SOLN
INTRAVENOUS | Status: DC | PRN
  Administered 2020-04-11: 5 mg via INTRAVENOUS
  Administered 2020-04-11: 40 mg via INTRAVENOUS
  Administered 2020-04-11: 10 mg via INTRAVENOUS

## 2020-04-11 MED ORDER — KETAMINE HCL 10 MG/ML IJ SOLN
INTRAMUSCULAR | Status: DC | PRN
  Administered 2020-04-11: 20 mg via INTRAVENOUS
  Administered 2020-04-11: 10 mg via INTRAVENOUS

## 2020-04-11 MED ORDER — ZOLPIDEM TARTRATE 5 MG PO TABS
5.0000 mg | ORAL_TABLET | Freq: Every evening | ORAL | Status: DC | PRN
  Administered 2020-04-11: 5 mg via ORAL
  Filled 2020-04-11: qty 1

## 2020-04-11 MED ORDER — EPHEDRINE SULFATE 50 MG/ML IJ SOLN
INTRAMUSCULAR | Status: DC | PRN
  Administered 2020-04-11 (×2): 5 mg via INTRAVENOUS

## 2020-04-11 MED ORDER — SUGAMMADEX SODIUM 500 MG/5ML IV SOLN
INTRAVENOUS | Status: AC
  Filled 2020-04-11: qty 15

## 2020-04-11 MED ORDER — LACTATED RINGERS IV SOLN: INTRAVENOUS | Status: DC | PRN

## 2020-04-11 MED ORDER — ACETAMINOPHEN 10 MG/ML IV SOLN
INTRAVENOUS | Status: DC | PRN
  Administered 2020-04-11: 1000 mg via INTRAVENOUS

## 2020-04-11 MED ORDER — BUPIVACAINE-EPINEPHRINE (PF) 0.25% -1:200000 IJ SOLN
INTRAMUSCULAR | Status: AC
  Filled 2020-04-11: qty 30

## 2020-04-11 MED ORDER — TRAMADOL 5 MG/ML ORAL SUSPENSION
50.0000 mg | Freq: Four times a day (QID) | ORAL | Status: DC | PRN
  Filled 2020-04-11 (×2): qty 10

## 2020-04-11 MED ORDER — PROPOFOL 10 MG/ML IV BOLUS
INTRAVENOUS | Status: DC | PRN
  Administered 2020-04-11: 130 mg via INTRAVENOUS

## 2020-04-11 MED ORDER — DEXAMETHASONE SODIUM PHOSPHATE 10 MG/ML IJ SOLN
INTRAMUSCULAR | Status: DC | PRN
  Administered 2020-04-11: 10 mg via INTRAVENOUS

## 2020-04-11 MED ORDER — MIDAZOLAM HCL 2 MG/2ML IJ SOLN
INTRAMUSCULAR | Status: DC | PRN
  Administered 2020-04-11: 2 mg via INTRAVENOUS

## 2020-04-11 MED ORDER — FENTANYL CITRATE (PF) 100 MCG/2ML IJ SOLN
INTRAMUSCULAR | Status: AC
  Filled 2020-04-11: qty 2

## 2020-04-11 SURGICAL SUPPLY — 54 items
ADH SKN CLS APL DERMABOND .7 (GAUZE/BANDAGES/DRESSINGS) ×1
APL PRP STRL LF DISP 70% ISPRP (MISCELLANEOUS) ×1
CANISTER SUCT 1200ML W/VALVE (MISCELLANEOUS) ×3 IMPLANT
CHLORAPREP W/TINT 26 (MISCELLANEOUS) ×3 IMPLANT
COVER TIP SHEARS 8 DVNC (MISCELLANEOUS) ×1 IMPLANT
COVER TIP SHEARS 8MM DA VINCI (MISCELLANEOUS) ×2
COVER WAND RF STERILE (DRAPES) ×6 IMPLANT
DEFOGGER SCOPE WARMER CLEARIFY (MISCELLANEOUS) ×3 IMPLANT
DERMABOND ADVANCED (GAUZE/BANDAGES/DRESSINGS) ×2
DERMABOND ADVANCED .7 DNX12 (GAUZE/BANDAGES/DRESSINGS) ×1 IMPLANT
DRAPE ARM DVNC X/XI (DISPOSABLE) ×3 IMPLANT
DRAPE COLUMN DVNC XI (DISPOSABLE) ×1 IMPLANT
DRAPE DA VINCI XI ARM (DISPOSABLE) ×6
DRAPE DA VINCI XI COLUMN (DISPOSABLE) ×2
ELECT CAUTERY BLADE 6.4 (BLADE) ×3 IMPLANT
ELECT REM PT RETURN 9FT ADLT (ELECTROSURGICAL) ×3
ELECTRODE REM PT RTRN 9FT ADLT (ELECTROSURGICAL) ×1 IMPLANT
GAUZE SPONGE 4X4 12PLY STRL (GAUZE/BANDAGES/DRESSINGS) ×3 IMPLANT
GLOVE BIO SURGEON STRL SZ7 (GLOVE) ×6 IMPLANT
GOWN STRL REUS W/ TWL LRG LVL3 (GOWN DISPOSABLE) ×3 IMPLANT
GOWN STRL REUS W/TWL LRG LVL3 (GOWN DISPOSABLE) ×9
IRRIGATION STRYKERFLOW (MISCELLANEOUS) IMPLANT
IRRIGATOR STRYKERFLOW (MISCELLANEOUS)
KIT PINK PAD W/HEAD ARE REST (MISCELLANEOUS) ×3
KIT PINK PAD W/HEAD ARM REST (MISCELLANEOUS) ×1 IMPLANT
LABEL OR SOLS (LABEL) ×3 IMPLANT
NEEDLE HYPO 22GX1.5 SAFETY (NEEDLE) ×3 IMPLANT
NEEDLE INSUFFLATION 14GA 120MM (NEEDLE) ×3 IMPLANT
OBTURATOR OPTICAL STANDARD 8MM (TROCAR) ×2
OBTURATOR OPTICAL STND 8 DVNC (TROCAR) ×1
OBTURATOR OPTICALSTD 8 DVNC (TROCAR) ×1 IMPLANT
PACK LAP CHOLECYSTECTOMY (MISCELLANEOUS) ×3 IMPLANT
PENCIL ELECTRO HAND CTR (MISCELLANEOUS) ×3 IMPLANT
SEAL CANN UNIV 5-8 DVNC XI (MISCELLANEOUS) ×3 IMPLANT
SEAL XI 5MM-8MM UNIVERSAL (MISCELLANEOUS) ×6
SEALER VESSEL DA VINCI XI (MISCELLANEOUS)
SEALER VESSEL EXT DVNC XI (MISCELLANEOUS) IMPLANT
SET TUBE SMOKE EVAC HIGH FLOW (TUBING) ×3 IMPLANT
SOLUTION ELECTROLUBE (MISCELLANEOUS) ×3 IMPLANT
SPONGE LAP 4X18 RFD (DISPOSABLE) ×3 IMPLANT
SUT ETHILON 3-0 FS-10 30 BLK (SUTURE) ×3
SUT MNCRL 4-0 (SUTURE) ×3
SUT MNCRL 4-0 27XMFL (SUTURE) ×1
SUT V-LOC 90 ABS 3-0 VLT  V-20 (SUTURE) ×4
SUT V-LOC 90 ABS 3-0 VLT V-20 (SUTURE) ×2 IMPLANT
SUT VLOC 90 S/L VL9 GS22 (SUTURE) ×3 IMPLANT
SUTURE EHLN 3-0 FS-10 30 BLK (SUTURE) ×1 IMPLANT
SUTURE MNCRL 4-0 27XMF (SUTURE) ×1 IMPLANT
SYR 30ML LL (SYRINGE) ×3 IMPLANT
TAPE TRANSPORE STRL 2 31045 (GAUZE/BANDAGES/DRESSINGS) ×3 IMPLANT
TUBE GASTRO 14F 5C (TUBING) IMPLANT
TUBE JEJUNO 16X14 (TUBING) IMPLANT
TUBE JEJUNO 18X14 (TUBING) ×3 IMPLANT
TUBE JEJUNO 22X14 (TUBING) IMPLANT

## 2020-04-11 NOTE — Progress Notes (Signed)
Preoperative Review   Patient is met in the preoperatively. The history is reviewed in the chart and with the patient. I personally reviewed the options and rationale as well as the risks of this procedure that have been previously discussed with the patient. All questions asked by the patient and/or family were answered to their satisfaction.  Patient agrees to proceed with this procedure at this time.  Hanan Mcwilliams M.D. FACS   

## 2020-04-11 NOTE — Transfer of Care (Signed)
Immediate Anesthesia Transfer of Care Note  Patient: Ronnie Ingram  Procedure(s) Performed: XI ROBOTIC ASSISTED G - TUBE PLACEMENT (N/A )  Patient Location: PACU  Anesthesia Type:General  Level of Consciousness: awake, alert , oriented and patient cooperative  Airway & Oxygen Therapy: Patient Spontanous Breathing  Post-op Assessment: Report given to RN and Post -op Vital signs reviewed and stable  Post vital signs: Reviewed and stable  Last Vitals:  Vitals Value Taken Time  BP 167/88 04/11/20 1333  Temp    Pulse 89 04/11/20 1340  Resp 27 04/11/20 1340  SpO2 94 % 04/11/20 1340  Vitals shown include unvalidated device data.  Last Pain:  Vitals:   04/11/20 1008  TempSrc: Temporal  PainSc: 2       Patients Stated Pain Goal: 0 (78/47/84 1282)  Complications: No complications documented.

## 2020-04-11 NOTE — Anesthesia Procedure Notes (Signed)
Procedure Name: Intubation Performed by: Fletcher-Harrison, Buddie Marston, CRNA Pre-anesthesia Checklist: Patient identified, Emergency Drugs available, Suction available and Patient being monitored Patient Re-evaluated:Patient Re-evaluated prior to induction Oxygen Delivery Method: Circle system utilized Preoxygenation: Pre-oxygenation with 100% oxygen Induction Type: IV induction Ventilation: Mask ventilation without difficulty Laryngoscope Size: McGraph and 3 Grade View: Grade I Tube type: Oral Tube size: 6.5 mm Number of attempts: 1 Airway Equipment and Method: Stylet and Oral airway Placement Confirmation: ETT inserted through vocal cords under direct vision,  positive ETCO2,  breath sounds checked- equal and bilateral and CO2 detector Secured at: 21 cm Tube secured with: Tape Dental Injury: Teeth and Oropharynx as per pre-operative assessment        

## 2020-04-11 NOTE — Progress Notes (Signed)
[facility] [place] NAME: Ronnie Ingram    MR#:  [id number]  DATE OF BIRTH:  1955/09/05  SUBJECTIVE:   Patient got back from peg tube placement. Wife in the room. Had headache earlier. Received Toradol improved.  Still tolerating full liquid diet. REVIEW OF SYSTEMS:   Review of Systems  Constitutional: Negative for chills, fever and weight loss.  HENT: Negative for ear discharge, ear pain and nosebleeds.   Eyes: Negative for blurred vision, pain and discharge.  Respiratory: Negative for sputum production, shortness of breath, wheezing and stridor.   Cardiovascular: Negative for chest pain, palpitations, orthopnea and PND.  Gastrointestinal: Negative for abdominal pain, diarrhea, nausea and vomiting.  Genitourinary: Negative for frequency and urgency.  Musculoskeletal: Negative for back pain and joint pain.  Neurological: Positive for weakness and headaches. Negative for sensory change, speech change and focal weakness.  Psychiatric/Behavioral: Negative for depression and hallucinations. The patient is not nervous/anxious.    Tolerating Diet:FLDTolerating PT: ambulatory  DRUG ALLERGIES:  No Known Allergies  VITALS:  Blood pressure 113/74, pulse 75, temperature 98 F (36.7 C), temperature source Oral, resp. rate 20, height 5\' 9"  (1.753 m), weight 66.2 kg, SpO2 94 %.  PHYSICAL EXAMINATION:   Physical Exam  GENERAL:  64 y.o.-year-old patient lying in the bed with no acute distress.  EYES: Pupils equal, round, reactive to light and accommodation. No scleral icterus.   HEENT: Head atraumatic, normocephalic. Oropharynx and nasopharynx clear.  NECK:  Supple, no jugular venous distention. No thyroid enlargement, no tenderness.  LUNGS: Normal breath sounds bilaterally, no wheezing, rales, rhonchi. No use of accessory muscles of respiration.  CARDIOVASCULAR: S1, S2 normal. No murmurs, rubs, or gallops.  ABDOMEN: Soft, nontender, nondistended.  Bowel sounds present. No organomegaly or mass. Peg site looks okay EXTREMITIES: No cyanosis, clubbing or edema b/l.    NEUROLOGIC: Cranial nerves II through XII are intact. No focal Motor or sensory deficits b/l.   PSYCHIATRIC:  patient is alert and oriented x 3.  SKIN: No obvious rash, lesion, or ulcer.   LABORATORY PANEL:  CBC Recent Labs  Lab 04/10/20 0542  WBC 5.4  HGB 13.3  HCT 37.3*  PLT 148*    Chemistries  Recent Labs  Lab 04/08/20 1453 04/08/20 1453 04/10/20 0542  NA 133*   < > 135  K 4.1   < > 3.9  CL 100   < > 103  CO2 23   < > 23  GLUCOSE 109*   < > 135*  BUN 20   < > 14  CREATININE 0.78   < > 0.75  CALCIUM 9.1   < > 8.8*  AST 27  --   --   ALT 42  --   --   ALKPHOS 83  --   --   BILITOT 1.1  --   --    < > = values in this interval not displayed.   Cardiac Enzymes No results for input(s): TROPONINI in the last 168 hours. RADIOLOGY:  No results found. ASSESSMENT AND PLAN:  Ronnie Ingram is a 64 y.o. 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 Chemotherapy-induced mucositis/failure to thrive -  Patient receiving chemo and radiation therapy for tonsillar cancer.  Able to tolerate full liquid diet   -s/p PEG feeding  Tube Placement - cont IV Diflucan to see if this makes a difference -dietitian consultation for tube feeding to be started tomorrow  2.COPD on Spiriva.  Respiratory status stable.  3. Essential hypertension.  Blood pressure stable lisinopril.  4 Headache received IV Toradol improved   Procedures: peg tube Family communication : wife in the room Consults : surgery, oncology CODE STATUS: full DVT Prophylaxis : Lovenox  Status is: Inpatient  Remains inpatient appropriate because:IV treatments appropriate due to intensity of illness or inability to  take PO   Dispo: The patient is from: Home              Anticipated d/c is to: Home              Anticipated d/c date is: 1 day              Patient currently is not medically stable to d/c.  Patient is status post peg tube placement secondary to severe leukocytosis chemotherapy induced along with failure to thrive. Dietitian to start bolus feedings tomorrow. Patient will need to stay additional day to ensure no re-feeding syndrome occurs.  TOTAL TIME TAKING CARE OF THIS PATIENT: *25* minutes.  >50% time spent on counselling and coordination of care  Note: This dictation was prepared with Dragon dictation along with smaller phrase technology. Any transcriptional errors that result from this process are unintentional.  Fritzi Mandes M.D    [facility]   CC: Primary care physician; Kerri Perches, PA-CPatient ID: Ronnie Ingram, male   DOB: September 12, 1955, 64 y.o.   MRN: [id number]

## 2020-04-11 NOTE — Progress Notes (Signed)
Initial Nutrition Assessment  DOCUMENTATION CODES:   Severe malnutrition in context of chronic illness  INTERVENTION:   Once appropriate for tube feeds, recommend:  Osmolite 1.5- 6 cans daily- Initiate with 1/2 can 6 times daily and advance as tolerated. Flush with 70ml of water before and after each tube feeding.  Pro-Source 99ml BID via tube, provides 40kcal and 11g of protein per serving   Regimen provides 2210kcal/day, 111g/day protein and 1880ml/day free water   Pt at high refeed risk; recommend monitor potassium, magnesium and phosphorus labs daily until stable  NUTRITION DIAGNOSIS:   Severe Malnutrition related to cancer and cancer related treatments as evidenced by moderate to severe fat depletions, 19 percent weight loss in < 5 months, moderate muscle to severe muscle depletions.  GOAL:   Patient will meet greater than or equal to 90% of their needs  MONITOR:   Labs, Weight trends, TF tolerance, Skin, I & O's  REASON FOR ASSESSMENT:   Malnutrition Screening Tool    ASSESSMENT:   64 y.o. 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   Pt s/p surgical G-tube placement today  Met with pt and pt's wife in room today. Pt sleeping and just returned from G-tube placement so history obtained from wife at bedside. Wife reports pt with decreased appetite and oral intake for several months pta r/t his cancer treatments but reports that his oral intake has been extremely poor over the past 2-3 weeks. Pt has been having issues with mucositis with no improvement after miracle mouthwash. Pt has been drinking 2-3 supplements per day at home. Per chart, pt has lost 34lbs(19%) since May; this is significant. Wife reports that the majority of the weight loss  has been over the past several weeks. Pt eating 100% of his full liquid diet yesterday prior to NPO. Pt s/p G-tube placement today. Plan is to initiate tube feeds tomorrow. Pt is at high refeed risk.   Medications reviewed and include: dexamethasone, lovenox, protonix, NaCl w/ 5% dextrose $RemoveBef'@125ml'yGklVhCaLR$ /hr, diflucan   Labs reviewed: K 3.9 wnl  NUTRITION - FOCUSED PHYSICAL EXAM:    Most Recent Value  Orbital Region Moderate depletion  Upper Arm Region Severe depletion  Thoracic and Lumbar Region Moderate depletion  Buccal Region Moderate depletion  Temple Region Moderate depletion  Clavicle Bone Region Severe depletion  Clavicle and Acromion Bone Region Severe depletion  Scapular Bone Region Moderate depletion  Dorsal Hand Unable to assess  Patellar Region Severe depletion  Anterior Thigh Region Severe depletion  Posterior Calf Region Severe depletion  Edema (RD Assessment) None  Hair Reviewed  Eyes Reviewed  Mouth Reviewed  Skin Reviewed  Nails Reviewed     Diet Order:   Diet Order            Diet full liquid Room service appropriate? Yes; Fluid consistency: Thin  Diet effective now                EDUCATION NEEDS:   Education needs have been addressed  Skin:  Skin Assessment: Reviewed RN Assessment (ecchymosis)  Last BM:  10/18  Height:   Ht Readings from Last 1 Encounters:  04/08/20 $RemoveB'5\' 9"'[id number]$  (1.753 m)    Weight:   Wt Readings from Last 1 Encounters:  04/09/20 66.2 kg    Ideal Body Weight:  72.7  kg  BMI:  Body mass index is 21.55 kg/m.  Estimated Nutritional Needs:   Kcal:  2000-2300kcal/day  Protein:  100-115g/day  Fluid:  2-2.3L/day  Koleen Distance MS, RD, LDN Please refer to [employer] for RD and/or RD on-call/weekend/after hours pager

## 2020-04-11 NOTE — Op Note (Addendum)
Robotic assisted laparoscopic gastrostomy tube   Pre-operative Diagnosis: malnutrition, head and neck cancer   Post-operative Diagnosis: same   Procedure:  Robotic assisted laparoscopic G tube   Surgeon: Caroleen Hamman, MD FACS   Anesthesia: Gen. with endotracheal tube   Findings: G tube w/o any leaks    Estimated Blood Loss:5 cc             Complications: none     Procedure Details  The patient was seen again in the Holding Room. The benefits, complications, treatment options, and expected outcomes were discussed with the Family. The risks of bleeding, infection, recurrence of symptoms, failure to resolve symptoms, bowel injury, any of which could require further surgery were reviewed .   The  family concurred with the proposed plan, giving informed consent.  The patient was taken to Operating Room, identified  and the procedure verified. A Time Out was held and the above information confirmed.   Prior to the induction of general anesthesia, antibiotic prophylaxis was administered. VTE prophylaxis was in place. General endotracheal anesthesia was then administered and tolerated well. After the induction, the abdomen was prepped with Chloraprep and draped in the sterile fashion. The patient was positioned in the supine position.   Veres needle approach was performed after identifying Palmer's point. Veres needle inserted and  Pneumoperitoneum was then created with CO2 and tolerated well without any adverse changes in the patient's vital signs.  Three 8-mm ports were placed under direct vision. Optiview approach used on the LEft lower quadrant Inspection revealed no evidence of injuries or metastatic disease  I grasped the body  stomach and tented toward the abdominal wall, small left subcostal incision created to placed Gastrostomy tube under direct visualization.     The patient was positioned  in reverse Trendelenburg, robot was brought to the surgical field and docked in the standard  fashion.  We made sure all the instrumentation was kept indirect view at all times and that there were no collision between the arms. I scrubbed out and went to the console. Using the scissors I performed a gastrotomy and confirmed that I was intraluminally. A partial pursestring suture was placed using 3 0 V-Loc suture around the G-tube in an inner fashion. I placed the G-tube through the gastrotomy in direct fashion.  I was able to get my scrub to flush saline via the G-tube confirming the proper position of the tube intraluminally.  The balloon was inflated and pulled back.  We then were able to tied the first pursestring in the standard fashion and completing our circumferential outer pursestring with the 2 V-Loc sutures Inspection of the  upper quadrant was performed. No bleeding, bile duct injury or leak, or bowel injury was noted.  All the needles were removed under direct visualization. Robotic instruments and robotic arms were undocked in the standard fashion.  I scrubbed back in. Liposomal Marcaine was used to infiltrate the abdominal wall at all incision sites   Pneumoperitoneum was released.  Marland Kitchen 4-0 subcuticular Monocryl was used to close the skin. Dermabond was  applied.  The patient was then extubated and brought to the recovery room in stable condition. Sponge, lap, and needle counts were correct at closure and at the conclusion of the case.               Caroleen Hamman, MD, FACS

## 2020-04-11 NOTE — Progress Notes (Signed)
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 CT soft tissue neck showed a hyperenhancement of left palatine tonsil measuring 2.8 cm. Enlarged left level 2A lymph node measuring 3.4 cm in long axis and a smaller but conspicuous level 2B lymph node measuring 9 mm. PET CT scan showed marked hypermetabolism in the left tonsillar region with an SUV of 8.8. SUV 4.2 at the level 2 lymph node. The smaller level 2B lymph node is superimposed on the dominant necrotic node. 4.8 x 4.4 cm abdominal aortic aneurysm. 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  Patient is currently undergoing concurrent chemoradiation with weekly cisplatin  Interval history-last chemotherapy was given on 04/02/2020.  At that time he was noted to have grade 1 mucositis.  Today patient states that his mouth pain has worsened to the point that he is unable to eat any solid food.  He is drinking about 2-3 ensures a day.  ECOG PS- 1 Pain scale- 5 Opioid associated constipation- no  Review of systems- Review of Systems  Constitutional: Positive for malaise/fatigue. Negative for chills, fever and weight loss.  HENT: Positive for sore throat. Negative for congestion, ear discharge and nosebleeds.        Mouth pain  Eyes: Negative for blurred vision.  Respiratory: Negative for cough, hemoptysis, sputum production, shortness of breath and wheezing.   Cardiovascular: Negative for  chest pain, palpitations, orthopnea and claudication.  Gastrointestinal: Negative for abdominal pain, blood in stool, constipation, diarrhea, heartburn, melena, nausea and vomiting.  Genitourinary: Negative for dysuria, flank pain, frequency, hematuria and urgency.  Musculoskeletal: Negative for back pain, joint pain and myalgias.  Skin: Negative for rash.  Neurological: Negative for dizziness, tingling, focal weakness, seizures, weakness and headaches.  Endo/Heme/Allergies: Does not bruise/bleed easily.  Psychiatric/Behavioral: Negative for depression and suicidal ideas. The patient does not have insomnia.       No Known Allergies   Past Medical History:  Diagnosis Date  . Arthritis   . Asthma   . Brain bleed ([place])    [place] years ago after falling off ladder  . COPD (chronic obstructive pulmonary disease) ([place])   . Diverticulitis   . Hypertension   . Tonsil cancer [facility])      Past Surgical History:  Procedure Laterality Date  . APPENDECTOMY    . FRACTURE SURGERY Left    ORIF left forearm  . HERNIA REPAIR Left    inguinal  . HERNIA REPAIR     abd  . INGUINAL HERNIA REPAIR Right 09/06/2018   Procedure: HERNIA REPAIR INGUINAL ADULT, RIGHT;  Surgeon: Herbert Pun, MD;  Location: [facility] ORS;  Service: General;  Laterality: Right;  . PORTA CATH INSERTION N/A 03/14/2020   Procedure: PORTA CATH INSERTION;  Surgeon: Algernon Huxley, MD;  Location: [place] [facility] LAB;  Service: Cardiovascular;  Laterality: N/A;    Social History   Socioeconomic History  . Marital status: Significant Other    Spouse name: Not on file  . Number of children: Not  on file  . Years of education: Not on file  . Highest education level: Not on file  Occupational History  . Not on file  Tobacco Use  . Smoking status: Current Every Day Smoker    Packs/day: 0.50    Years: 30.00    Pack years: 15.00    Types: Cigarettes  . Smokeless tobacco: Never Used  Vaping Use  . Vaping Use: Never used   Substance and Sexual Activity  . Alcohol use: Yes    Alcohol/week: 12.0 standard drinks    Types: 12 Cans of beer per week  . Drug use: Yes    Types: Marijuana  . Sexual activity: Not on file  Other Topics Concern  . Not on file  Social History Narrative  . Not on file   Social Determinants of Health   Financial Resource Strain:   . Difficulty of Paying Living Expenses: Not on file  Food Insecurity:   . Worried About Charity fundraiser in the Last Year: Not on file  . Ran Out of Food in the Last Year: Not on file  Transportation Needs:   . Lack of Transportation (Medical): Not on file  . Lack of Transportation (Non-Medical): Not on file  Physical Activity:   . Days of Exercise per Week: Not on file  . Minutes of Exercise per Session: Not on file  Stress:   . Feeling of Stress : Not on file  Social Connections:   . Frequency of Communication with Friends and Family: Not on file  . Frequency of Social Gatherings with Friends and Family: Not on file  . Attends Religious Services: Not on file  . Active Member of Clubs or Organizations: Not on file  . Attends Archivist Meetings: Not on file  . Marital Status: Not on file  Intimate Partner Violence:   . Fear of Current or Ex-Partner: Not on file  . Emotionally Abused: Not on file  . Physically Abused: Not on file  . Sexually Abused: Not on file    No family history on file.  No current facility-administered medications for this visit. No current outpatient medications on file.  Facility-Administered Medications Ordered in Other Visits:  .  acetaminophen (OFIRMEV) IV, , Intravenous, Anesthesia Intra-op, Fletcher-Harrison, Tawana, CRNA, 1,000 mg at 04/11/20 1210 .  [COMPLETED] 6 CHG cloth bath night before surgery, , , Once **AND** 6 CHG cloth bath AM of surgery, , , Once **AND** [COMPLETED] Chlorhexidine Gluconate Cloth 2 % PADS 6 each, 6 each, Topical, Once, 6 each at 04/11/20 0737 **AND** Chlorhexidine  Gluconate Cloth 2 % PADS 6 each, 6 each, Topical, Once, Pabon, Diego F, MD .  dexamethasone (DECADRON) injection, , Intravenous, Anesthesia Intra-op, Fletcher-Harrison, Tawana, CRNA, 10 mg at 04/11/20 1153 .  [MAR Hold] dexamethasone (DECADRON) tablet 8 mg, 8 mg, Oral, Daily, Damita Dunnings, Hazel V, MD .  dextrose 5 %-0.45 % sodium chloride infusion, , Intravenous, Continuous, Gregor Hams, MD, Stopped at 04/11/20 1140 .  [MAR Hold] enoxaparin (LOVENOX) injection 40 mg, 40 mg, Subcutaneous, Q24H, Agbata, Tochukwu, MD, 40 mg at 04/10/20 2122 .  ePHEDrine injection, , Intravenous, Anesthesia Intra-op, Fletcher-Harrison, Tawana, CRNA, 5 mg at 04/11/20 1209 .  [MAR Hold] feeding supplement (ENSURE ENLIVE / ENSURE PLUS) liquid 237 mL, 237 mL, Oral, TID BM, Wieting, Richard, MD, 237 mL at 04/10/20 1343 .  fentaNYL (SUBLIMAZE) injection, , Intravenous, Anesthesia Intra-op, Fletcher-Harrison, Tawana, CRNA, 50 mcg at 04/11/20 1148 .  [MAR Hold] fluconazole (DIFLUCAN) IVPB 200  mg, 200 mg, Intravenous, Q24H, Dallie Piles, [place], Last Rate: 100 mL/hr at 04/10/20 1747, Rate Verify at 04/10/20 1747 .  [MAR Hold] gabapentin (NEURONTIN) capsule 300 mg, 300 mg, Oral, BID PRN, Agbata, Tochukwu, MD .  glycopyrrolate (ROBINUL) injection, , Intravenous, Anesthesia Intra-op, Fletcher-Harrison, Tawana, CRNA, 2 mg at 04/11/20 1153 .  ketamine (KETALAR) injection, , Intravenous, Anesthesia Intra-op, Fletcher-Harrison, Tawana, CRNA, 20 mg at 04/11/20 1224 .  lactated ringers infusion, , Intravenous, Continuous PRN, Fletcher-Harrison, Tawana, CRNA, New Bag at 04/11/20 1140 .  lidocaine (cardiac) 100 mg/82mL (XYLOCAINE) injection 2%, , Intravenous, Anesthesia Intra-op, Fletcher-Harrison, Tawana, CRNA, 60 mg at 04/11/20 1145 .  [MAR Hold] lidocaine-prilocaine (EMLA) cream, , Topical, Once, Agbata, Tochukwu, MD .  Doug Sou Hold] magic mouthwash w/lidocaine, 5 mL, Oral, QID PRN, Agbata, Tochukwu, MD .  Doug Sou Hold] MEDLINE mouth rinse, 15  mL, Mouth Rinse, BID, Athena Masse, MD, 15 mL at 04/11/20 0818 .  midazolam (VERSED) injection, , Intravenous, Anesthesia Intra-op, Fletcher-Harrison, Tawana, CRNA, 2 mg at 04/11/20 1139 .  [MAR Hold] morphine CONCENTRATE 10 MG/0.5ML oral solution 10 mg, 10 mg, Oral, Q4H PRN, Agbata, Tochukwu, MD, 10 mg at 04/11/20 0325 .  [MAR Hold] ondansetron (ZOFRAN) tablet 4 mg, 4 mg, Oral, Q6H PRN **OR** [MAR Hold] ondansetron (ZOFRAN) injection 4 mg, 4 mg, Intravenous, Q6H PRN, Agbata, Tochukwu, MD .  ondansetron (ZOFRAN) injection, , Intravenous, Anesthesia Intra-op, Fletcher-Harrison, Tawana, CRNA, 4 mg at 04/11/20 1153 .  [MAR Hold] pantoprazole (PROTONIX) injection 40 mg, 40 mg, Intravenous, Q24H, Agbata, Tochukwu, MD, 40 mg at 04/11/20 0821 .  phenylephrine (NEO-SYNEPHRINE) 100 mcg/mL in sodium chloride 0.9 % 100 mL infusion, , Intravenous, Continuous PRN, Fletcher-Harrison, Tawana, CRNA, Last Rate: 6 mL/hr at 04/11/20 1218, 10 mcg/min at 04/11/20 1218 .  propofol (DIPRIVAN) 10 mg/mL bolus/IV push, , Intravenous, Anesthesia Intra-op, Fletcher-Harrison, Tawana, CRNA, 130 mg at 04/11/20 1146 .  rocuronium (ZEMURON) injection, , Intravenous, Anesthesia Intra-op, Fletcher-Harrison, Tawana, CRNA, 40 mg at 04/11/20 1149 .  succinylcholine (ANECTINE) injection, , Intravenous, Anesthesia Intra-op, Fletcher-Harrison, Tawana, CRNA, 100 mg at 04/11/20 1147 .  [MAR Hold] tiotropium (SPIRIVA) inhalation capsule ([facility] use ONLY) 18 mcg, 18 mcg, Inhalation, Daily, Agbata, Tochukwu, MD, 18 mcg at 04/10/20 2127 .  [MAR Hold] zolpidem (AMBIEN) tablet 5 mg, 5 mg, Oral, QHS PRN, Fritzi Mandes, MD  Physical exam:  Vitals:   04/06/20 1313  BP: 111/74  Pulse: 69  Resp: 18  SpO2: 96%  Weight: 154 lb (69.9 kg)   Physical Exam Constitutional:      Comments: Appears fatigued  HENT:     Mouth/Throat:     Comments: Grade 2 mucositis noted Eyes:     Pupils: Pupils are equal, round, and reactive to light.  Cardiovascular:      Rate and Rhythm: Normal rate and regular rhythm.     Heart sounds: Normal heart sounds.  Pulmonary:     Effort: Pulmonary effort is normal.     Breath sounds: Normal breath sounds.  Abdominal:     General: Bowel sounds are normal.     Palpations: Abdomen is soft.  Skin:    General: Skin is warm and dry.  Neurological:     Mental Status: He is alert and oriented to person, place, and time.      CMP Latest Ref Rng & Units 04/10/2020  Glucose 70 - 99 mg/dL 135(H)  BUN 8 - 23 mg/dL 14  Creatinine 0.61 - 1.24 mg/dL 0.75  Sodium 135 - 145 mmol/L  135  Potassium 3.5 - 5.1 mmol/L 3.9  Chloride 98 - 111 mmol/L 103  CO2 22 - 32 mmol/L 23  Calcium 8.9 - 10.3 mg/dL 8.8(L)  Total Protein 6.5 - 8.1 g/dL -  Total Bilirubin 0.3 - 1.2 mg/dL -  Alkaline Phos 38 - 126 U/L -  AST 15 - 41 U/L -  ALT 0 - 44 U/L -   CBC Latest Ref Rng & Units 04/10/2020  WBC 4.0 - 10.5 K/uL 5.4  Hemoglobin 13.0 - 17.0 g/dL 13.3  Hematocrit 39 - 52 % 37.3(L)  Platelets 150 - 400 K/uL 148(L)    No images are attached to the encounter.  PERIPHERAL VASCULAR CATHETERIZATION  Result Date: 03/14/2020 See op note    Assessment and plan- Patient is a 64 y.o. 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  This is an acute visit for worsening mucositis  Radiation was held this week yesterday and day before as well.  Patient does have evidence of worsening mucositis and will likely need to have chemo and radiation on hold for next week as well.  For his mucositis associated pain:I have asked him to double up on his morphine liquid as well as continue Magic mouthwash.  It is likely to heal when chemoradiation is held  Abnormal weight loss: Patient has lost significant weight since starting chemoradiation and is unable to eat much due to ongoing mucositis.  He still has more than 50% of his chemoradiation remaining.  We therefore discussed  inserting a feeding tube at this time to maintain his nutritional status while going through chemoradiation.  I have personally spoken to Dr. Dahlia Byes and plan is to get a feeding tube placed on 04/12/2020.  We will give him fluids today and bring him back for fluids on 04/09/2020 as well.  His labs look fairly unremarkable at this time and he is not in any acute renal failure   Visit Diagnosis 1. Mucositis   2. Squamous cell carcinoma of oropharynx ([place])   3. Neoplasm related pain   4. Dehydration      Dr. Randa Evens, MD, MPH [facility] [phone] 04/11/2020 12:29 PM

## 2020-04-12 ENCOUNTER — Ambulatory Visit: Payer: Medicare Other

## 2020-04-12 DIAGNOSIS — K1230 Oral mucositis (ulcerative), unspecified: Secondary | ICD-10-CM | POA: Diagnosis not present

## 2020-04-12 DIAGNOSIS — E43 Unspecified severe protein-calorie malnutrition: Secondary | ICD-10-CM | POA: Insufficient documentation

## 2020-04-12 LAB — MAGNESIUM: Magnesium: 1.3 mg/dL — ABNORMAL LOW (ref 1.7–2.4)

## 2020-04-12 LAB — POTASSIUM: Potassium: 3.2 mmol/L — ABNORMAL LOW (ref 3.5–5.1)

## 2020-04-12 LAB — PHOSPHORUS: Phosphorus: 2.9 mg/dL (ref 2.5–4.6)

## 2020-04-12 MED ORDER — FREE WATER
120.0000 mL | Freq: Every day | Status: DC
  Administered 2020-04-12 – 2020-04-13 (×5): 120 mL

## 2020-04-12 MED ORDER — OSMOLITE 1.5 CAL PO LIQD
120.0000 mL | Freq: Every day | ORAL | Status: DC
  Administered 2020-04-12 – 2020-04-13 (×5): 120 mL

## 2020-04-12 MED ORDER — POTASSIUM CHLORIDE 20 MEQ PO PACK
40.0000 meq | PACK | Freq: Once | ORAL | Status: AC
  Administered 2020-04-12: 40 meq
  Filled 2020-04-12: qty 2

## 2020-04-12 MED ORDER — PROSOURCE TF PO LIQD
45.0000 mL | Freq: Two times a day (BID) | ORAL | Status: DC
  Administered 2020-04-12 – 2020-04-13 (×2): 45 mL
  Filled 2020-04-12: qty 45

## 2020-04-12 MED ORDER — MAGNESIUM SULFATE 2 GM/50ML IV SOLN
2.0000 g | Freq: Once | INTRAVENOUS | Status: AC
  Administered 2020-04-12: 2 g via INTRAVENOUS
  Filled 2020-04-12: qty 50

## 2020-04-12 NOTE — Care Management Important Message (Signed)
Important Message  Patient Details  Name: Ronnie Ingram MRN: [id number] Date of Birth: 10/25/1955   Medicare Important Message Given:  Yes     Dannette Barbara 04/12/2020, 1:31 PM

## 2020-04-12 NOTE — Anesthesia Postprocedure Evaluation (Signed)
Anesthesia Post Note  Patient: Ronnie Ingram  Procedure(s) Performed: XI ROBOTIC ASSISTED G - TUBE PLACEMENT (N/A )  Patient location during evaluation: PACU Anesthesia Type: General Level of consciousness: awake and alert Pain management: pain level controlled Vital Signs Assessment: post-procedure vital signs reviewed and stable Respiratory status: spontaneous breathing, nonlabored ventilation, respiratory function stable and patient connected to nasal cannula oxygen Cardiovascular status: blood pressure returned to baseline and stable Postop Assessment: no apparent nausea or vomiting Anesthetic complications: no   No complications documented.   Last Vitals:  Vitals:   04/12/20 0532 04/12/20 0802  BP: 128/72 135/83  Pulse: (!) 56 (!) 55  Resp: 18 20  Temp: 36.7 C 36.6 C  SpO2: 97% 98%    Last Pain:  Vitals:   04/12/20 0802  TempSrc: Oral  PainSc: 0-No pain                 Molli Barrows

## 2020-04-12 NOTE — Progress Notes (Signed)
[place] for Electrolyte Monitoring and Replacement   Recent Labs: Potassium (mmol/L)  Date Value  04/12/2020 3.2 (L)  11/09/2012 3.8   Magnesium (mg/dL)  Date Value  04/12/2020 1.3 (L)   Calcium (mg/dL)  Date Value  04/10/2020 8.8 (L)   Calcium, Total (mg/dL)  Date Value  11/09/2012 9.1   Albumin (g/dL)  Date Value  04/08/2020 3.9  11/09/2012 3.9   Phosphorus (mg/dL)  Date Value  04/12/2020 2.9   Sodium (mmol/L)  Date Value  04/10/2020 135  11/09/2012 136   MIVF: D51/2NS @ 125 mL/hr  Assessment: 64 y.o. male with below list of previous medical conditions including COPD hypertension tonsillar cancer which patient is currently receiving chemoradiation therapy last treatment on Tuesday and mucositis presents to the emergency department secondary to inability to tolerate eating with approximately 10 pound weight loss over the past 2 weeks s/p surgical G-tube placement 10/20  Feeding Supplementation:  1) Osmolite 120 mL 6 times daily 2) ProSource TF 45 mL BID  Goal of Therapy:  Electrolytes WNL  Plan:   2 grams IV magnesium sulfate x 1  40 mEq KCl per tube x 1  Re-check all electrolytes in am  Dallie Piles ,PharmD Clinical Pharmacist 04/12/2020 7:55 AM

## 2020-04-12 NOTE — Progress Notes (Signed)
[facility] NAME: Kendrix Orman    MR#:  [id number]  DATE OF BIRTH:  [birth year]  SUBJECTIVE:   Patient got back from peg tube placement. Wife in the room. Had headache earlier. Received Toradol improved.  Still tolerating full liquid diet. REVIEW OF SYSTEMS:   Review of Systems  Constitutional: Negative for chills, fever and weight loss.  HENT: Negative for ear discharge, ear pain and nosebleeds.   Eyes: Negative for blurred vision, pain and discharge.  Respiratory: Negative for sputum production, shortness of breath, wheezing and stridor.   Cardiovascular: Negative for chest pain, palpitations, orthopnea and PND.  Gastrointestinal: Negative for abdominal pain, diarrhea, nausea and vomiting.  Genitourinary: Negative for frequency and urgency.  Musculoskeletal: Negative for back pain and joint pain.  Neurological: Positive for weakness and headaches. Negative for sensory change, speech change and focal weakness.  Psychiatric/Behavioral: Negative for depression and hallucinations. The patient is not nervous/anxious.    Tolerating Diet:FLDTolerating PT: ambulatory  DRUG ALLERGIES:  No Known Allergies  VITALS:  Blood pressure 136/85, pulse (!) 57, temperature 98.2 F (36.8 C), temperature source Oral, resp. rate 20, height 5\' 9"  (1.753 m), weight 66.2 kg, SpO2 97 %.  PHYSICAL EXAMINATION:   Physical Exam  GENERAL:  64 y.o.-year-old patient lying in the bed with no acute distress.  EYES: Pupils equal, round, reactive to light and accommodation. No scleral icterus.   HEENT: Head atraumatic, normocephalic. Oropharynx and nasopharynx clear.  NECK:  Supple, no jugular venous distention. No thyroid enlargement, no tenderness.  LUNGS: Normal breath sounds bilaterally, no wheezing, rales, rhonchi. No use of accessory muscles of respiration.  CARDIOVASCULAR: S1, S2 normal. No murmurs, rubs, or gallops.  ABDOMEN: Soft, nontender,  nondistended. Bowel sounds present. No organomegaly or mass. Peg site looks okay, Abdominal binder+ EXTREMITIES: No cyanosis, clubbing or edema b/l.    NEUROLOGIC: Cranial nerves II through XII are intact. No focal Motor or sensory deficits b/l.   PSYCHIATRIC:  patient is alert and oriented x 3.  SKIN: No obvious rash, lesion, or ulcer.   LABORATORY PANEL:  CBC Recent Labs  Lab 04/10/20 0542  WBC 5.4  HGB 13.3  HCT 37.3*  PLT 148*    Chemistries  Recent Labs  Lab 04/08/20 1453 04/08/20 1453 04/10/20 0542 04/10/20 0542 04/12/20 0602  NA 133*   < > 135  --   --   K 4.1   < > 3.9   < > 3.2*  CL 100   < > 103  --   --   CO2 23   < > 23  --   --   GLUCOSE 109*   < > 135*  --   --   BUN 20   < > 14  --   --   CREATININE 0.78   < > 0.75  --   --   CALCIUM 9.1   < > 8.8*  --   --   MG  --   --   --   --  1.3*  AST 27  --   --   --   --   ALT 42  --   --   --   --   ALKPHOS 83  --   --   --   --   BILITOT 1.1  --   --   --   --    < > = values in this interval not displayed.  Cardiac Enzymes No results for input(s): TROPONINI in the last 168 hours. RADIOLOGY:  No results found. ASSESSMENT AND PLAN:  HALEEM HANNER is a 64 y.o. male with medical history significant squamous cell carcinoma of the oropharynx currently on radiation therapy and chemotherapy who reports to the emergency room for evaluation of pain in his mouth and difficulty swallowing  1. Chemotherapy-induced mucositis/failure to thrive -  Patient receiving chemo and radiation therapy for tonsillar cancer.  Able to tolerate full liquid diet   -s/p PEG feeding tube Placement - cont IV Diflucan to see if this makes a difference -dietitian consultation for tube feeding to be started today. Wife to be educated regarding it  2.COPD on Spiriva.  Respiratory status stable.  3. Essential hypertension.  Blood pressure stable lisinopril.  4 Headache received IV Toradol improved  5. Electrolyte abnormality likely  due to re-feeding syndrome. Pharmacy to replete it.   Procedures: peg tube Family communication : wife in the room Consults : surgery, oncology CODE STATUS: full DVT Prophylaxis : Lovenox  Status is: Inpatient  Remains inpatient appropriate because:IV treatments appropriate due to intensity of illness or inability to take PO   Dispo: The patient is from: Home              Anticipated d/c is to: Home              Anticipated d/c date is: 1 day October 10/22              Patient currently is not medically stable to d/c.  Patient to be started on peg tube feeding. Need to monitor electrolyte for one more day to ensure not developing re-feeding syndrome. TOTAL TIME TAKING CARE OF THIS PATIENT: *25* minutes.  >50% time spent on counselling and coordination of care  Note: This dictation was prepared with Dragon dictation along with smaller phrase technology. Any transcriptional errors that result from this process are unintentional.  Fritzi Mandes M.D    [facility]   CC: Primary care physician; Kerri Perches, PA-CPatient ID: Simonne Maffucci, male   DOB: 31-Jan-1956, 64 y.o.   MRN: [id number]

## 2020-04-13 ENCOUNTER — Ambulatory Visit: Payer: Medicare Other

## 2020-04-13 ENCOUNTER — Other Ambulatory Visit: Payer: Self-pay | Admitting: *Deleted

## 2020-04-13 DIAGNOSIS — C109 Malignant neoplasm of oropharynx, unspecified: Secondary | ICD-10-CM

## 2020-04-13 DIAGNOSIS — K1230 Oral mucositis (ulcerative), unspecified: Secondary | ICD-10-CM | POA: Diagnosis not present

## 2020-04-13 LAB — BASIC METABOLIC PANEL
Anion gap: 8 (ref 5–15)
BUN: 19 mg/dL (ref 8–23)
CO2: 23 mmol/L (ref 22–32)
Calcium: 8.4 mg/dL — ABNORMAL LOW (ref 8.9–10.3)
Chloride: 101 mmol/L (ref 98–111)
Creatinine, Ser: 0.76 mg/dL (ref 0.61–1.24)
GFR, Estimated: >60 mL/min (ref >60)
Glucose, Bld: 147 mg/dL — ABNORMAL HIGH (ref 70–99)
Potassium: 5 mmol/L (ref 3.5–5.1)
Sodium: 132 mmol/L — ABNORMAL LOW (ref 135–145)

## 2020-04-13 LAB — PHOSPHORUS: Phosphorus: 3.3 mg/dL (ref 2.5–4.6)

## 2020-04-13 LAB — MAGNESIUM: Magnesium: 2 mg/dL (ref 1.7–2.4)

## 2020-04-13 MED ORDER — PANTOPRAZOLE SODIUM 40 MG PO TBEC
40.0000 mg | DELAYED_RELEASE_TABLET | Freq: Every day | ORAL | Status: DC
  Administered 2020-04-13: 40 mg via ORAL
  Filled 2020-04-13: qty 1

## 2020-04-13 MED ORDER — FREE WATER: 120.0000 mL | Freq: Every day | 1 refills | Status: DC

## 2020-04-13 MED ORDER — PROSOURCE TF PO LIQD: 45.0000 mL | Freq: Two times a day (BID) | ORAL | 1 refills | Status: AC

## 2020-04-13 MED ORDER — FLUCONAZOLE 100 MG PO TABS
200.0000 mg | ORAL_TABLET | Freq: Every day | ORAL | Status: DC
  Administered 2020-04-13: 200 mg via ORAL
  Filled 2020-04-13: qty 2

## 2020-04-13 MED ORDER — OSMOLITE 1.5 CAL PO LIQD: 120.0000 mL | Freq: Every day | ORAL | 3 refills | Status: AC

## 2020-04-13 MED ORDER — OSMOLITE 1.5 CAL PO LIQD
237.0000 mL | Freq: Every day | ORAL | Status: DC
  Administered 2020-04-13: 237 mL

## 2020-04-13 MED ORDER — FLUCONAZOLE 200 MG PO TABS: 200.0000 mg | ORAL_TABLET | Freq: Every day | ORAL | 0 refills | Status: AC

## 2020-04-13 MED ORDER — PANTOPRAZOLE SODIUM 40 MG PO TBEC
40.0000 mg | DELAYED_RELEASE_TABLET | Freq: Every day | ORAL | 0 refills | Status: DC
Start: 2020-04-13 — End: 2020-06-27

## 2020-04-13 NOTE — Discharge Summary (Signed)
Ronnie Ingram at [place] NAME: Ronnie Ingram    MR#:  [id number]  DATE OF BIRTH:  February 06, 1956  DATE OF ADMISSION:  04/09/2020 ADMITTING PHYSICIAN: Ronnie Masse, MD  DATE OF DISCHARGE: 1-/22/2021  PRIMARY CARE PHYSICIAN: Ronnie Perches, PA-C    ADMISSION DIAGNOSIS:  Mucositis [K12.30] Mucositis due to chemotherapy [K12.31]  DISCHARGE DIAGNOSIS:  Mucositis suspected Chemotherapy induced  SECONDARY DIAGNOSIS:   Past Medical History:  Diagnosis Date  . Arthritis   . Asthma   . Brain bleed ([place])    [place]ak years ago after falling off ladder  . COPD (chronic obstructive pulmonary disease) ([place])   . Diverticulitis   . Hypertension   . 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 Chemotherapy-induced mucositis/failure to thrive - Patient receiving chemo and radiation therapy for tonsillar cancer. Able to tolerate full liquid diet --feels better and requesting Regular diet -s/p PEG feeding tube Placement--tolerating bolus feedings and education done by RN regarding feeds. -ON IV Diflucan ---change to oral diflucan -dietitian consultation for tube feeding done. Wife to be educated regarding it. -Dr Janese Banks aware of pt's d/c dermatitis/c decadron  2.COPD on Spiriva. Respiratory status stable.  3. Essential hypertension.Blood pressure stable  -cont lisinopril.  4 Headache received IV Toradol improved  5. Electrolyte abnormality likely due to re-feeding syndrome.  -labs good good this am  Patient will discharged home today. f/u at the cancer center near appointment.  Procedures: peg tube Family communication : pt today Consults : surgery, oncology CODE STATUS: full DVT  Prophylaxis : Lovenox  Status is: Inpatient  Dispo: The patient is from: Home  Anticipated d/c is to: Home  Anticipated d/c date is: today  Patient currently is  medically stable to d/c.  CONSULTS OBTAINED:  Treatment Team:  Sindy Guadeloupe, MD  DRUG ALLERGIES:  No Known Allergies  DISCHARGE MEDICATIONS:   Allergies as of 04/13/2020   No Known Allergies     Medication List    STOP taking these medications   dexamethasone 4 MG tablet Commonly known as: DECADRON   LORazepam 0.5 MG tablet Commonly known as: Ativan   ondansetron 8 MG tablet Commonly known as: Zofran   prochlorperazine 10 MG tablet Commonly known as: COMPAZINE     TAKE these medications   Clotrimazole 1 % Oint Apply a thin layer to the affected area twice daily as needed. What changed:   how much to take  how to take this  when to take this  reasons to take this  additional instructions   feeding supplement (OSMOLITE 1.5 CAL) Liqd Place 120 mLs into feeding tube 6 (six) times daily.   feeding supplement (PROSource TF) liquid Place 45 mLs into feeding tube 2 (two) times daily.   fluconazole 200 MG tablet Commonly known as: DIFLUCAN Take 1 tablet (200 mg total) by mouth daily for 7 doses.   free water Soln Place 120 mLs into feeding tube 6 (six) times daily.   gabapentin 300 MG capsule Commonly known as: NEURONTIN Take 300 mg by mouth 2 (two) times daily as needed (pain).   lidocaine-prilocaine cream Commonly known as: EMLA Apply to affected area once   lisinopril 40 MG tablet Commonly known as: ZESTRIL Take 40 mg  by mouth daily.   magic mouthwash w/lidocaine Soln Take 5 mLs by mouth 4 (four) times daily as needed for mouth pain.   morphine 20 MG/ML concentrated solution Commonly known as: ROXANOL Take 0.5 mLs (10 mg total) by mouth every 4 (four) hours as needed for severe pain.   pantoprazole 40 MG tablet Commonly known as:  PROTONIX Take 1 tablet (40 mg total) by mouth daily.   tiotropium 18 MCG inhalation capsule Commonly known as: SPIRIVA Place 18 mcg into inhaler and inhale daily.            Discharge Care Instructions  (From admission, onward)         Start     Ordered   04/13/20 0000  Discharge wound care:       Comments: Abdominal binder   04/13/20 0841          If you experience worsening of your admission symptoms, develop shortness of breath, life threatening emergency, suicidal or homicidal thoughts you must seek medical attention immediately by calling 911 or calling your MD immediately  if symptoms less severe.  You Must read complete instructions/literature along with all the possible adverse reactions/side effects for all the Medicines you take and that have been prescribed to you. Take any new Medicines after you have completely understood and accept all the possible adverse reactions/side effects.   Please note  You were cared for by a hospitalist during your hospital stay. If you have any questions about your discharge medications or the care you received while you were in the hospital after you are discharged, you can call the unit and asked to speak with the hospitalist on call if the hospitalist that took care of you is not available. Once you are discharged, your primary care physician will handle any further medical issues. Please note that NO REFILLS for any discharge medications will be authorized once you are discharged, as it is imperative that you return to your primary care physician (or establish a relationship with a primary care physician if you do not have one) for your aftercare needs so that they can reassess your need for medications and monitor your lab values. Today   SUBJECTIVE  I want to eat regular food. I am very hungry and I love my food.   VITAL SIGNS:  Blood pressure (!) 156/84, pulse (!) 56, temperature 98.2 F (36.8 C), temperature source Oral, resp.  rate 20, height 5\' 9"  (1.753 m), weight 66.2 kg, SpO2 96 %.  I/O:    Intake/Output Summary (Last 24 hours) at 04/13/2020 0843 Last data filed at 04/12/2020 1909 Gross per 24 hour  Intake 4424.52 ml  Output --  Net 4424.52 ml    PHYSICAL EXAMINATION:  GENERAL:  63 y.o.-year-old patient lying in the bed with no acute distress.  EYES: Pupils equal, round, reactive to light and accommodation. No scleral icterus.  HEENT: Head atraumatic, normocephalic. Oropharynx and nasopharynx clear.  NECK:  Supple, no jugular venous distention. No thyroid enlargement, no tenderness.  LUNGS: Normal breath sounds bilaterally, no wheezing, rales,rhonchi or crepitation. No use of accessory muscles of respiration.  CARDIOVASCULAR: S1, S2 normal. No murmurs, rubs, or gallops.  ABDOMEN: Soft, non-tender, non-distended. Bowel sounds present. No organomegaly or mass. PEG+ (abd binder) EXTREMITIES: No pedal edema, cyanosis, or clubbing.  NEUROLOGIC: Cranial nerves II through XII are intact. Muscle strength 5/5 in all extremities. Sensation intact. Gait not checked.  PSYCHIATRIC: The patient is alert and oriented x 3.  SKIN: No  obvious rash, lesion, or ulcer.   DATA REVIEW:   CBC  Recent Labs  Lab 04/10/20 0542  WBC 5.4  HGB 13.3  HCT 37.3*  PLT 148*    Chemistries  Recent Labs  Lab 04/08/20 1453 04/10/20 0542 04/13/20 0457  NA 133*   < > 132*  K 4.1   < > 5.0  CL 100   < > 101  CO2 23   < > 23  GLUCOSE 109*   < > 147*  BUN 20   < > 19  CREATININE 0.78   < > 0.76  CALCIUM 9.1   < > 8.4*  MG  --    < > 2.0  AST 27  --   --   ALT 42  --   --   ALKPHOS 83  --   --   BILITOT 1.1  --   --    < > = values in this interval not displayed.    Microbiology Results   Recent Results (from the past 240 hour(s))  Respiratory Panel by RT PCR (Flu A&B, Covid) - Nasopharyngeal Swab     Status: None   Collection Time: 04/09/20  6:00 AM   Specimen: Nasopharyngeal Swab  Result Value Ref Range Status    SARS Coronavirus 2 by RT PCR NEGATIVE NEGATIVE Final    Comment: (NOTE) SARS-CoV-2 target nucleic acids are NOT DETECTED.  The SARS-CoV-2 RNA is generally detectable in upper respiratoy specimens during the acute phase of infection. The lowest concentration of SARS-CoV-2 viral copies this assay can detect is 131 copies/mL. A negative result does not preclude SARS-Cov-2 infection and should not be used as the sole basis for treatment or other patient management decisions. A negative result may occur with  improper specimen collection/handling, submission of specimen other than nasopharyngeal swab, presence of viral mutation(s) within the areas targeted by this assay, and inadequate number of viral copies (<131 copies/mL). A negative result must be combined with clinical observations, patient history, and epidemiological information. The expected result is Negative.  Fact Sheet for Patients:  PinkCheek.be  Fact Sheet for Healthcare Providers:  GravelBags.it  This test is no t yet approved or cleared by the [place] FDA and  has been authorized for detection and/or diagnosis of SARS-CoV-2 by FDA under an Emergency Use Authorization (EUA). This EUA will remain  in effect (meaning this test can be used) for the duration of the COVID-19 declaration under Section 564(b)(1) of the Act, 21 U.S.C. section 360bbb-3(b)(1), unless the authorization is terminated or revoked sooner.     Influenza A by PCR NEGATIVE NEGATIVE Final   Influenza B by PCR NEGATIVE NEGATIVE Final    Comment: (NOTE) The Xpert Xpress SARS-CoV-2/FLU/RSV assay is intended as an aid in  the diagnosis of influenza from Nasopharyngeal swab specimens and  should not be used as a sole basis for treatment. Nasal washings and  aspirates are unacceptable for Xpert Xpress SARS-CoV-2/FLU/RSV  testing.  Fact Sheet for  Patients: PinkCheek.be  Fact Sheet for Healthcare Providers: GravelBags.it  This test is not yet approved or cleared by the [place] FDA and  has been authorized for detection and/or diagnosis of SARS-CoV-2 by  FDA under an Emergency Use Authorization (EUA). This EUA will remain  in effect (meaning this test can be used) for the duration of the  Covid-19 declaration under Section 564(b)(1) of the Act, 21  U.S.C. section 360bbb-3(b)(1), unless the authorization is  terminated or revoked. Performed at [facility], [address] [place], [place] [address]   MRSA PCR Screening     Status: None   Collection Time: 04/10/20  9:34 PM   Specimen: Nasal Mucosa; Nasopharyngeal  Result Value Ref Range Status   MRSA by PCR NEGATIVE NEGATIVE Final    Comment:        The GeneXpert MRSA Assay (FDA approved for NASAL specimens only), is one component of a comprehensive MRSA colonization surveillance program. It is not intended to diagnose MRSA infection nor to guide or monitor treatment for MRSA infections. Performed at [facility], [address], [place], [place] [address]     RADIOLOGY:  No results found.   CODE STATUS:     Code Status Orders  (From admission, onward)         Start     Ordered   04/09/20 0912  Full code  Continuous        04/09/20 0913        Code Status History    This patient has a current code status but no historical code status.   Advance Care Planning Activity       TOTAL TIME TAKING CARE OF THIS PATIENT: *40* minutes.    Fritzi Mandes M.D  [facility]    CC: Primary care physician; Ronnie Perches, PA-C

## 2020-04-13 NOTE — TOC Initial Note (Addendum)
Transition of Care [facility]) - Initial/Assessment Note    Patient Details  Name: Ronnie Ingram MRN: [id number] Date of Birth: 08-03-1955  [facility] CM/SW Contact:    Candie Chroman, LCSW Phone Number: 04/13/2020, 9:11 AM  Clinical Narrative:  CSW met with patient. No supports at bedside. CSW introduced role and explained that discharge planning would be discussed. Patient has orders to discharge home today with new peg tube. Notified him that DME agency is aware of tube feed need and should deliver feeds to his home today. Patient declined need for home health RN. He feels comfortable managing it and said his wife will be here shortly for teaching. Patient knows he can contact Dr. Janese Banks or his PCP if he decides he wants a home health RN once he discharges. CSW spoke with [facility] representative. He has sent out a message to check ETA for tube feed delivery and will follow up once he has an answer. No further concerns. CSW encouraged patient to contact CSW as needed. CSW will continue to follow patient for support and facilitate return home today.              9:50 am: Per [facility], they are processing order for delivery today. Updated patient's wife at bedside.  Expected Discharge Plan: Home/Self Care Barriers to Discharge: No Barriers Identified   Patient Goals and CMS Choice     Choice offered to / list presented to : NA  Expected Discharge Plan and Services Expected Discharge Plan: Home/Self Care     Post Acute Care Choice: Durable Medical Equipment Living arrangements for the past 2 months: Single Family Home Expected Discharge Date: 04/13/20               DME Arranged: Francesco Runner feeding DME Agency: [facility] Date DME Agency Contacted: 04/13/20   Representative spoke with at DME Agency: Andree Coss            Prior Living Arrangements/Services Living arrangements for the past 2 months: [place] Lives with:: Spouse Patient language and need for  interpreter reviewed:: Yes Do you feel safe going back to the place where you live?: Yes      Need for Family Participation in Patient Care: Yes (Comment) Care giver support system in place?: Yes (comment)   Criminal Activity/Legal Involvement Pertinent to Current Situation/Hospitalization: No - Comment as needed  Activities of Daily Living Home Assistive Devices/Equipment: None ADL Screening (condition at time of admission) Patient's cognitive ability adequate to safely complete daily activities?: Yes Is the patient deaf or have difficulty hearing?: No Does the patient have difficulty seeing, even when wearing glasses/contacts?: No Does the patient have difficulty concentrating, remembering, or making decisions?: No Patient able to express need for assistance with ADLs?: No Does the patient have difficulty dressing or bathing?: No Independently performs ADLs?: Yes (appropriate for developmental age) Does the patient have difficulty walking or climbing stairs?: No Weakness of Legs: None Weakness of Arms/Hands: None  Permission Sought/Granted Permission sought to share information with : Facility Art therapist granted to share information with : Yes, Verbal Permission Granted     Permission granted to share info w AGENCY: [facility]        Emotional Assessment Appearance:: Appears stated age Attitude/Demeanor/Rapport: Engaged, Gracious Affect (typically observed): Accepting, Appropriate, Calm, Pleasant Orientation: : Oriented to Self, Oriented to Place, Oriented to  Time, Oriented to Situation Alcohol / Substance Use: Not Applicable Psych Involvement: No (comment)  Admission diagnosis:  Mucositis [K12.30] Mucositis due to chemotherapy [K12.31] Patient Active Problem List   Diagnosis Date Noted  . Protein-calorie malnutrition, severe 04/12/2020  . Mucositis due to chemotherapy   . Mucositis 04/09/2020  . COPD (chronic obstructive pulmonary disease)  ([place])   . Essential hypertension   . Squamous cell carcinoma of oropharynx ([place]) 03/08/2020  . Smoker 12/07/2017   PCP:  Kerri Perches, PA-C Pharmacy:   [facility] [address], [place] - [place] [address] [place] [address] Phone: [phone] Fax: [phone]     Social Determinants of Health (SDOH) Interventions    Readmission Risk Interventions No flowsheet data found.

## 2020-04-13 NOTE — Progress Notes (Signed)
Nutrition Follow Up Note   DOCUMENTATION CODES:   Severe malnutrition in context of chronic illness  INTERVENTION:   Increase to Osmolite 1.5- 6 cans daily- Flush with 54m of water before and after each tube feeding.  Pro-Source 474mBID via tube, provides 40kcal and 11g of protein per serving   Regimen provides 2210kcal/day, 111g/day protein and 180653may free water   NUTRITION DIAGNOSIS:   Severe Malnutrition related to cancer and cancer related treatments as evidenced by moderate to severe fat depletions, 19 percent weight loss in < 5 months, moderate muscle to severe muscle depletions.  GOAL:   Patient will meet greater than or equal to 90% of their needs -progressing with tube feeds   MONITOR:   Labs, Weight trends, TF tolerance, Skin, I & O's  ASSESSMENT:   [age]o. 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   Pt s/p surgical G-tube placement 10/20  Met with pt and pt's wife in room today. Pt in good spirits today; pt up walking around the room, is fully dressed and ready to go home. Pt reports that he is tolerating his tube feeds well at 50% goal. Plan is to advance to goal of one can, six times daily today. Refeed labs are improving. Pt has also been eating 100% of his full liquid diet and was advanced to a regular diet today. RD discussed with pt tube feed recommendations and answered all of pt's questions. Plan is to continue with full tube feeds for now. Pt will follow up with the Dietitian at the cancer center for further recommendations as his oral intake improves.   Medications reviewed and include: lovenox, protonix, NaCl w/ 5% dextrose _0 /hr  Labs reviewed: Na 132(L), P 3.3 wnl, Mg 2.0 wnl  Diet Order:   Diet Order             Diet regular Room service appropriate? Yes; Fluid consistency: Thin  Diet effective now           Diet - low sodium heart healthy                EDUCATION NEEDS:   Education needs have been addressed  Skin:  Skin Assessment: Reviewed RN Assessment (ecchymosis)  Last BM:  10/21- type 5  Height:   Ht Readings from Last 1 Encounters:  04/08/20 _1  (1.753 m)    Weight:   Wt Readings from Last 1 Encounters:  04/09/20 66.2 kg    Ideal Body Weight:  72.7 kg  BMI:  Body mass index is 21.55 kg/m.  Estimated Nutritional Needs:   Kcal:  2000-2300kcal/day  Protein:  100-115g/day  Fluid:  2-2.3L/day  CasKoleen Distance, RD, LDN Please refer to [facility] and/or RD on-call/weekend/after hours pager

## 2020-04-13 NOTE — Progress Notes (Signed)
Patient discharged home via [place], spouse driving.  Reviewed discharge instructions, medications, appointments with patient/spouse.  Spouse completed return demonstration of GT site care. Patient has successfully completed multiple demonstrations of administering his own TF- states he will show his spouse. Patient is stable, respirations even and non-labored, VSS.Marland Kitchen Volunteer picked patient up in wheelchair, patient departed with spouse.

## 2020-04-13 NOTE — Discharge Instructions (Signed)
E care and feeding per instructions

## 2020-04-13 NOTE — Progress Notes (Signed)
Nutrition Brief Follow-Up Note  Home tube feed regimen for [employer]  Osmolite 1.5- 6 cans daily- Flush with 74ml of water before and after each tube feeding.  Pro-Source 64ml BID via tube, provides 40kcal and 11g of protein per serving   Regimen provides 2210kcal/day, 111g/day protein and 1884ml/day free water   Estimated Nutritional Needs:   Kcal:  2000-2300kcal/day Protein:  100-115g/day Fluid:  2-2.3L/day  Koleen Distance MS, RD, LDN Please refer to [facility] for RD and/or RD on-call/weekend/after hours pager

## 2020-04-16 ENCOUNTER — Other Ambulatory Visit: Payer: Self-pay

## 2020-04-16 ENCOUNTER — Encounter: Payer: Self-pay | Admitting: Oncology

## 2020-04-16 ENCOUNTER — Inpatient Hospital Stay: Payer: Medicare Other

## 2020-04-16 ENCOUNTER — Other Ambulatory Visit: Payer: Self-pay | Admitting: *Deleted

## 2020-04-16 ENCOUNTER — Ambulatory Visit
Admission: RE | Admit: 2020-04-16 | Discharge: 2020-04-16 | Disposition: A | Discharge status: Home / Self Care | Payer: Medicare Other | Source: Ambulatory Visit | Attending: Radiation Oncology | Admitting: Radiation Oncology

## 2020-04-16 ENCOUNTER — Inpatient Hospital Stay (HOSPITAL_BASED_OUTPATIENT_CLINIC_OR_DEPARTMENT_OTHER): Payer: Medicare Other | Admitting: Oncology

## 2020-04-16 VITALS — BP 117/67 | HR 67 | Temp 98.7°F | Resp 20 | Wt 156.4 lb

## 2020-04-16 DIAGNOSIS — Z510 Encounter for antineoplastic radiation therapy: Secondary | ICD-10-CM | POA: Diagnosis not present

## 2020-04-16 DIAGNOSIS — Z5111 Encounter for antineoplastic chemotherapy: Secondary | ICD-10-CM | POA: Diagnosis not present

## 2020-04-16 DIAGNOSIS — C109 Malignant neoplasm of oropharynx, unspecified: Secondary | ICD-10-CM

## 2020-04-16 DIAGNOSIS — Z95828 Presence of other vascular implants and grafts: Secondary | ICD-10-CM

## 2020-04-16 DIAGNOSIS — F17210 Nicotine dependence, cigarettes, uncomplicated: Secondary | ICD-10-CM | POA: Diagnosis not present

## 2020-04-16 DIAGNOSIS — K1230 Oral mucositis (ulcerative), unspecified: Secondary | ICD-10-CM | POA: Diagnosis not present

## 2020-04-16 DIAGNOSIS — C099 Malignant neoplasm of tonsil, unspecified: Secondary | ICD-10-CM | POA: Diagnosis present

## 2020-04-16 LAB — CBC WITH DIFFERENTIAL/PLATELET
Abs Immature Granulocytes: 0.02 10*3/uL (ref 0.00–0.07)
Basophils Absolute: 0 10*3/uL (ref 0.0–0.1)
Basophils Relative: 1 %
Eosinophils Absolute: 0.5 10*3/uL (ref 0.0–0.5)
Eosinophils Relative: 8 %
HCT: 36.5 % — ABNORMAL LOW (ref 39.0–52.0)
Hemoglobin: 13.1 g/dL (ref 13.0–17.0)
Immature Granulocytes: 0 %
Lymphocytes Relative: 33 %
Lymphs Abs: 2.1 10*3/uL (ref 0.7–4.0)
MCH: 35.2 pg — ABNORMAL HIGH (ref 26.0–34.0)
MCHC: 35.9 g/dL (ref 30.0–36.0)
MCV: 98.1 fL (ref 80.0–100.0)
Monocytes Absolute: 0.5 10*3/uL (ref 0.1–1.0)
Monocytes Relative: 8 %
Neutro Abs: 3.2 10*3/uL (ref 1.7–7.7)
Neutrophils Relative %: 50 %
Platelets: 261 10*3/uL (ref 150–400)
RBC: 3.72 MIL/uL — ABNORMAL LOW (ref 4.22–5.81)
RDW: 13.2 % (ref 11.5–15.5)
WBC: 6.5 10*3/uL (ref 4.0–10.5)
nRBC: 0 % (ref 0.0–0.2)

## 2020-04-16 LAB — COMPREHENSIVE METABOLIC PANEL
ALT: 25 U/L (ref 0–44)
AST: 27 U/L (ref 15–41)
Albumin: 3.4 g/dL — ABNORMAL LOW (ref 3.5–5.0)
Alkaline Phosphatase: 78 U/L (ref 38–126)
Anion gap: 9 (ref 5–15)
BUN: 19 mg/dL (ref 8–23)
CO2: 29 mmol/L (ref 22–32)
Calcium: 8.9 mg/dL (ref 8.9–10.3)
Chloride: 98 mmol/L (ref 98–111)
Creatinine, Ser: 0.84 mg/dL (ref 0.61–1.24)
GFR, Estimated: >60 mL/min (ref >60)
Glucose, Bld: 134 mg/dL — ABNORMAL HIGH (ref 70–99)
Potassium: 4.1 mmol/L (ref 3.5–5.1)
Sodium: 136 mmol/L (ref 135–145)
Total Bilirubin: 0.4 mg/dL (ref 0.3–1.2)
Total Protein: 6.7 g/dL (ref 6.5–8.1)

## 2020-04-16 LAB — MAGNESIUM: Magnesium: 2 mg/dL (ref 1.7–2.4)

## 2020-04-16 LAB — PHOSPHORUS: Phosphorus: 4.5 mg/dL (ref 2.5–4.6)

## 2020-04-16 MED ORDER — PALONOSETRON HCL INJECTION 0.25 MG/5ML
0.2500 mg | Freq: Once | INTRAVENOUS | Status: AC
  Administered 2020-04-16: 0.25 mg via INTRAVENOUS
  Filled 2020-04-16: qty 5

## 2020-04-16 MED ORDER — SODIUM CHLORIDE 0.9 % IV SOLN
40.0000 mg/m2 | Freq: Once | INTRAVENOUS | Status: AC
  Administered 2020-04-16: 75 mg via INTRAVENOUS
  Filled 2020-04-16: qty 75

## 2020-04-16 MED ORDER — SODIUM CHLORIDE 0.9% FLUSH
10.0000 mL | Freq: Once | INTRAVENOUS | Status: AC
  Administered 2020-04-16: 10 mL via INTRAVENOUS
  Filled 2020-04-16: qty 10

## 2020-04-16 MED ORDER — SODIUM CHLORIDE 0.9 % IV SOLN
Freq: Once | INTRAVENOUS | Status: AC
  Filled 2020-04-16: qty 250

## 2020-04-16 MED ORDER — MORPHINE SULFATE (CONCENTRATE) 20 MG/ML PO SOLN: 10.0000 mg | ORAL | 0 refills | Status: DC | PRN

## 2020-04-16 MED ORDER — SODIUM CHLORIDE 0.9 % IV SOLN
10.0000 mg | Freq: Once | INTRAVENOUS | Status: AC
  Administered 2020-04-16: 10 mg via INTRAVENOUS
  Filled 2020-04-16: qty 10

## 2020-04-16 MED ORDER — HEPARIN SOD (PORK) LOCK FLUSH 100 UNIT/ML IV SOLN
500.0000 [IU] | Freq: Once | INTRAVENOUS | Status: AC
  Administered 2020-04-16: 500 [IU] via INTRAVENOUS
  Filled 2020-04-16: qty 5

## 2020-04-16 MED ORDER — HEPARIN SOD (PORK) LOCK FLUSH 100 UNIT/ML IV SOLN
INTRAVENOUS | Status: AC
  Filled 2020-04-16: qty 5

## 2020-04-16 MED ORDER — SODIUM CHLORIDE 0.9 % IV SOLN
150.0000 mg | Freq: Once | INTRAVENOUS | Status: AC
  Administered 2020-04-16: 150 mg via INTRAVENOUS
  Filled 2020-04-16: qty 150

## 2020-04-16 MED ORDER — SODIUM CHLORIDE 0.9 % IV SOLN
Freq: Once | INTRAVENOUS | Status: AC
  Filled 2020-04-16: qty 1000

## 2020-04-16 NOTE — Progress Notes (Addendum)
Nutrition Follow-up:  Patient with tonsil cancer stage IV, HPV+.  Patient receiving chemotherapy and radiation therapy.  Hospital admission noted and PEG placement on 10/20.    Met with patient during infusion.  Patient reports that over the weekend gave 2 cartons of tube feeding (ensure plus was sent vs osmolite 1.5 due to shortage) on Saturday and 2 cartons on Sunday.  Reports that his mouth pain has improved and he is eating orally.  Ate oatmeal, toast and drank milk.  Supper last night was hot dog and potted meat sandwich with cheese.  Drank 1 equate plus shake. Drinking water, pepsi and milk.       Medications: reviewed  Labs: reviewed  Anthropometrics:   Weight 156 lb today decreased from 162 lb on 9/27   Estimated Energy Needs  Kcals: 2100-2485 Protein: 105-124 g Fluid: > 2.1 L  NUTRITION DIAGNOSIS: Inadequate oral intake continues    INTERVENTION:  Recommend at least 3 cartons of osmolite 1.5 between meal times/oral intake.  Flush with 62m of water before and after each feeding.  Prosource 452mBID (once he receives from [employer] per representative). Provided mixing instructions for prosource.   Called significant other PeKieth Brightlyo discuss recommendations.  PeKieth Brightlyeports that patient gave 4 tube feedings on Saturday and 4 on Sunday.  None of the protein liquid has been given.  PeKieth Brightlyeports trouble paying for medications and bills.  Will send message to [facility] reach out to [place]      MONITORING, EVALUATION, GOAL: weight trends, intake, tube feeding tolerance   NEXT VISIT: Nov 4 phone call  Payslie Mccaig B. AlZenia ResidesRDColumbianaLDApexegistered Dietitian [phone])

## 2020-04-17 ENCOUNTER — Ambulatory Visit
Admission: RE | Admit: 2020-04-17 | Discharge: 2020-04-17 | Disposition: A | Discharge status: Home / Self Care | Payer: Medicare Other | Source: Ambulatory Visit | Attending: Radiation Oncology | Admitting: Radiation Oncology

## 2020-04-17 ENCOUNTER — Telehealth: Payer: Self-pay | Admitting: *Deleted

## 2020-04-17 ENCOUNTER — Other Ambulatory Visit: Payer: Self-pay | Admitting: Surgery

## 2020-04-17 DIAGNOSIS — Z510 Encounter for antineoplastic radiation therapy: Secondary | ICD-10-CM | POA: Diagnosis not present

## 2020-04-17 DIAGNOSIS — K9423 Gastrostomy malfunction: Secondary | ICD-10-CM

## 2020-04-17 NOTE — Telephone Encounter (Signed)
Spoke with patient wife [name], she states that the patient is leaking through his clothing and around the tube. Per Dr.Pabon recommends patient to come in tomorrow for an office visit due to him being out of the office today in the OR. Patient wife verbalized understanding and the patient is scheduled for 2:30pm on 04/18/20.    Per Dr.Pabon requested for patient to have imaging of the G Tube done prior to coming in for an appointment. 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 Pamala Hurry provided [place] with the [employer] information that Dr.Pabon may contact tomorrow after evaluating the patient. Rad [place] [phone]. She stated that Dr.Pabon may contact directly. Pamala Hurry stated to keep the order in the chart in case Dr.Pabon would still like for the patient to have the imaging done after scheduled appointment. Dr.Pabon verbalized understanding.

## 2020-04-17 NOTE — Telephone Encounter (Signed)
Patients wife [name] called and stated that his G tube placement is leaking out at the incision all over his clothes and gauges. She stated that it started last night but its been worse today.  He had G tube placement on 04/11/20 by Dr.Pabon

## 2020-04-17 NOTE — Telephone Encounter (Signed)
I have messaged Dr. Dahlia Byes and Dr. Hampton Abbot and waiting to hear back. It might help if his wife can call their office. Its better if they take a look rather than [place]

## 2020-04-17 NOTE — Telephone Encounter (Signed)
I spoke with wife and she said that patient has appointment tomorrow with surgeon I told her that he can stop by the radiation therapy nurses desk when he comes in for his treatment today and they can help him with some dressings until he sees surgeon tomorrow. She states he has short term memory loss and so I called and spoke with Lorry in radiation therapy and she will assist with this

## 2020-04-17 NOTE — Telephone Encounter (Signed)
Ronnie Ingram called reporting that the feeding tube inserted 10/20 is leaking bad enough to saturate his underwear and sleep pants. Asking that it be looked at. Please advise

## 2020-04-17 NOTE — Progress Notes (Signed)
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 CT soft tissue neck showed a hyperenhancement of left palatine tonsil measuring 2.8 cm. Enlarged left level 2A lymph node measuring 3.4 cm in long axis and a smaller but conspicuous level 2B lymph node measuring 9 mm. PET CT scan showed marked hypermetabolism in the left tonsillar region with an SUV of 8.8. SUV 4.2 at the level 2 lymph node. The smaller level 2B lymph node is superimposed on the dominant necrotic node. 4.8 x 4.4 cm abdominal aortic aneurysm. 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 Baruch Gouty and plan is for concurrent chemoradiation.   Treatment interrupted by worsening mucositis requiring hospitalization and feeding tube placement.  Interval history-patient reports that his mouth feels better and he is able to eat.  He is also using 3 Osmolite cans through his PEG tube.  He has gained 2 pounds in the last 3 days.  He reports ongoing fatigue and still has some mild pain for which he uses as needed oxycodone  ECOG PS- 1 Pain scale- 3 Opioid associated constipation- no  Review of systems- Review of Systems  Constitutional: Positive for malaise/fatigue. Negative for chills, fever and weight loss.  HENT: Negative for congestion, ear discharge and nosebleeds.   Eyes: Negative for blurred vision.    Respiratory: Negative for cough, hemoptysis, sputum production, shortness of breath and wheezing.   Cardiovascular: Negative for chest pain, palpitations, orthopnea and claudication.  Gastrointestinal: Negative for abdominal pain, blood in stool, constipation, diarrhea, heartburn, melena, nausea and vomiting.       Mouth pain  Genitourinary: Negative for dysuria, flank pain, frequency, hematuria and urgency.  Musculoskeletal: Negative for back pain, joint pain and myalgias.  Skin: Negative for rash.  Neurological: Negative for dizziness, tingling, focal weakness, seizures, weakness and headaches.  Endo/Heme/Allergies: Does not bruise/bleed easily.  Psychiatric/Behavioral: Negative for depression and suicidal ideas. The patient does not have insomnia.        No Known Allergies   Past Medical History:  Diagnosis Date  . Arthritis   . Asthma   . Brain bleed ([place])    [place] years ago after falling off ladder  . COPD (chronic obstructive pulmonary disease) ([place])   . Diverticulitis   . Hypertension   . Tonsil cancer [facility])      Past Surgical History:  Procedure Laterality Date  . APPENDECTOMY    . FRACTURE SURGERY Left    ORIF left forearm  . HERNIA REPAIR Left    inguinal  . HERNIA REPAIR     abd  . INGUINAL HERNIA REPAIR Right 09/06/2018   Procedure: HERNIA REPAIR INGUINAL ADULT, RIGHT;  Surgeon: Herbert Pun, MD;  Location: [facility] ORS;  Service: General;  Laterality: Right;  . PORTA CATH INSERTION N/A 03/14/2020   Procedure: PORTA CATH INSERTION;  Surgeon: Algernon Huxley, MD;  Location: [facility];  Service: Cardiovascular;  Laterality: N/A;    Social History  Socioeconomic History  . Marital status: Significant Other    Spouse name: Not on file  . Number of children: Not on file  . Years of education: Not on file  . Highest education level: Not on file  Occupational History  . Not on file  Tobacco Use  . Smoking status: Current Every Day Smoker     Packs/day: 0.50    Years: 30.00    Pack years: 15.00    Types: Cigarettes  . Smokeless tobacco: Never Used  Vaping Use  . Vaping Use: Never used  Substance and Sexual Activity  . Alcohol use: Yes    Alcohol/week: 12.0 standard drinks    Types: 12 Cans of beer per week  . Drug use: Yes    Types: Marijuana  . Sexual activity: Not on file  Other Topics Concern  . Not on file  Social History Narrative  . Not on file   Social Determinants of Health   Financial Resource Strain:   . Difficulty of Paying Living Expenses: Not on file  Food Insecurity:   . Worried About Charity fundraiser in the Last Year: Not on file  . Ran Out of Food in the Last Year: Not on file  Transportation Needs:   . Lack of Transportation (Medical): Not on file  . Lack of Transportation (Non-Medical): Not on file  Physical Activity:   . Days of Exercise per Week: Not on file  . Minutes of Exercise per Session: Not on file  Stress:   . Feeling of Stress : Not on file  Social Connections:   . Frequency of Communication with Friends and Family: Not on file  . Frequency of Social Gatherings with Friends and Family: Not on file  . Attends Religious Services: Not on file  . Active Member of Clubs or Organizations: Not on file  . Attends Archivist Meetings: Not on file  . Marital Status: Not on file  Intimate Partner Violence:   . Fear of Current or Ex-Partner: Not on file  . Emotionally Abused: Not on file  . Physically Abused: Not on file  . Sexually Abused: Not on file    History reviewed. No pertinent family history.   Current Outpatient Medications:  .  Clotrimazole 1 % OINT, Apply a thin layer to the affected area twice daily as needed. (Patient taking differently: Apply 1 application topically daily as needed (rash). ), Disp: 30 g, Rfl: 0 .  fluconazole (DIFLUCAN) 200 MG tablet, Take 1 tablet (200 mg total) by mouth daily for 7 doses., Disp: 7 tablet, Rfl: 0 .  gabapentin (NEURONTIN)  300 MG capsule, Take 300 mg by mouth 2 (two) times daily as needed (pain). , Disp: , Rfl:  .  lidocaine-prilocaine (EMLA) cream, Apply to affected area once, Disp: 30 g, Rfl: 3 .  lisinopril (PRINIVIL,ZESTRIL) 40 MG tablet, Take 40 mg by mouth daily. , Disp: , Rfl:  .  magic mouthwash w/lidocaine SOLN, Take 5 mLs by mouth 4 (four) times daily as needed for mouth pain., Disp: 340 mL, Rfl: 1 .  Nutritional Supplements (FEEDING SUPPLEMENT, OSMOLITE 1.5 CAL,) LIQD, Place 120 mLs into feeding tube 6 (six) times daily., Disp: 237 mL, Rfl: 3 .  Nutritional Supplements (FEEDING SUPPLEMENT, PROSOURCE TF,) liquid, Place 45 mLs into feeding tube 2 (two) times daily., Disp: 2700 mL, Rfl: 1 .  pantoprazole (PROTONIX) 40 MG tablet, Take 1 tablet (40 mg total) by mouth daily., Disp: 30 tablet, Rfl: 0 .  tiotropium (SPIRIVA) 18 MCG inhalation capsule, Place 18 mcg into inhaler and inhale daily. , Disp: , Rfl:  .  Water For Irrigation, Sterile (FREE WATER) SOLN, Place 120 mLs into feeding tube 6 (six) times daily., Disp: 1000 mL, Rfl: 1 .  morphine (ROXANOL) 20 MG/ML concentrated solution, Take 0.5 mLs (10 mg total) by mouth every 4 (four) hours as needed for severe pain., Disp: 30 mL, Rfl: 0  Physical exam:  Vitals:   04/16/20 0901  BP: 117/67  Pulse: 67  Resp: 20  Temp: 98.7 F (37.1 C)  SpO2: 98%  Weight: 156 lb 6.4 oz (70.9 kg)   Physical Exam HENT:     Head: Normocephalic and atraumatic.     Mouth/Throat:     Mouth: Mucous membranes are moist.     Pharynx: Oropharynx is clear.     Comments: Mucositis has now resolved Eyes:     Pupils: Pupils are equal, round, and reactive to light.  Cardiovascular:     Rate and Rhythm: Normal rate and regular rhythm.     Heart sounds: Normal heart sounds.  Pulmonary:     Effort: Pulmonary effort is normal.     Breath sounds: Normal breath sounds.  Abdominal:     General: Bowel sounds are normal.     Palpations: Abdomen is soft.     Comments: PEG tube in  place  Musculoskeletal:     Cervical back: Normal range of motion.  Skin:    General: Skin is warm and dry.  Neurological:     Mental Status: He is alert and oriented to person, place, and time.      CMP Latest Ref Rng & Units 04/16/2020  Glucose 70 - 99 mg/dL 134(H)  BUN 8 - 23 mg/dL 19  Creatinine 0.61 - 1.24 mg/dL 0.84  Sodium 135 - 145 mmol/L 136  Potassium 3.5 - 5.1 mmol/L 4.1  Chloride 98 - 111 mmol/L 98  CO2 22 - 32 mmol/L 29  Calcium 8.9 - 10.3 mg/dL 8.9  Total Protein 6.5 - 8.1 g/dL 6.7  Total Bilirubin 0.3 - 1.2 mg/dL 0.4  Alkaline Phos 38 - 126 U/L 78  AST 15 - 41 U/L 27  ALT 0 - 44 U/L 25   CBC Latest Ref Rng & Units 04/16/2020  WBC 4.0 - 10.5 K/uL 6.5  Hemoglobin 13.0 - 17.0 g/dL 13.1  Hematocrit 39 - 52 % 36.5(L)  Platelets 150 - 400 K/uL 261     Assessment and plan- Patient is a 64 y.o. 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    He is here for on treatment assessment prior to cycle 4 of weekly cisplatin chemotherapy  Counts are okay to proceed with cycle 4 of weekly cisplatin chemotherapy today.  I will see him back in 1 week for cycle 5.  If his mucositis recurs and it gets worse plan is to continue radiation but stop chemotherapy altogether.  Mucositis: Currently resolved and patient is on as needed oxycodone.  Continue to monitor  Abnormal weight loss and severe protein calorie malnutrition: Patient currently has a feeding tube in place and needs at least 3 cartons of Osmolite between meals which she is trying to achieve.  Nutrition is following him as well.   Visit Diagnosis 1. Squamous cell carcinoma of oropharynx ([place])   2. Encounter for antineoplastic chemotherapy   3. Mucositis      Dr. Randa Evens, MD, MPH  [facility] [phone] 04/17/2020 12:51 PM

## 2020-04-18 ENCOUNTER — Ambulatory Visit
Admission: RE | Admit: 2020-04-18 | Discharge: 2020-04-18 | Disposition: A | Discharge status: Home / Self Care | Payer: Medicare Other | Source: Ambulatory Visit | Attending: Radiation Oncology | Admitting: Radiation Oncology

## 2020-04-18 ENCOUNTER — Encounter: Payer: Self-pay | Admitting: Surgery

## 2020-04-18 ENCOUNTER — Other Ambulatory Visit: Payer: Self-pay

## 2020-04-18 ENCOUNTER — Ambulatory Visit (INDEPENDENT_AMBULATORY_CARE_PROVIDER_SITE_OTHER): Payer: Medicare Other | Admitting: Surgery

## 2020-04-18 ENCOUNTER — Ambulatory Visit
Admission: RE | Admit: 2020-04-18 | Discharge: 2020-04-18 | Disposition: A | Discharge status: Home / Self Care | Payer: Medicare Other | Source: Ambulatory Visit | Attending: Surgery | Admitting: Surgery

## 2020-04-18 VITALS — BP 137/85 | HR 65 | Temp 98.2°F | Resp 14 | Ht 70.0 in | Wt 154.0 lb

## 2020-04-18 DIAGNOSIS — Z510 Encounter for antineoplastic radiation therapy: Secondary | ICD-10-CM | POA: Diagnosis not present

## 2020-04-18 DIAGNOSIS — K9423 Gastrostomy malfunction: Secondary | ICD-10-CM | POA: Diagnosis present

## 2020-04-18 DIAGNOSIS — Z09 Encounter for follow-up examination after completed treatment for conditions other than malignant neoplasm: Secondary | ICD-10-CM

## 2020-04-18 MED ORDER — METOCLOPRAMIDE HCL 5 MG PO TABS: 5.0000 mg | ORAL_TABLET | Freq: Three times a day (TID) | ORAL | 0 refills | Status: DC | PRN

## 2020-04-18 MED ORDER — OMEPRAZOLE 40 MG PO CPDR: 40.0000 mg | DELAYED_RELEASE_CAPSULE | Freq: Every day | ORAL | 0 refills | Status: DC

## 2020-04-18 MED ORDER — IOHEXOL 350 MG/ML SOLN
175.0000 mL | Freq: Once | INTRAVENOUS | Status: AC | PRN
  Administered 2020-04-18: 175 mL

## 2020-04-18 MED ORDER — SODIUM CHLORIDE (PF) 0.9 % IJ SOLN
20.0000 mL | INTRAMUSCULAR | Status: DC | PRN
  Administered 2020-04-18: 20 mL

## 2020-04-18 NOTE — Progress Notes (Addendum)
S/p robotic g tube, reports leaking food around gastrostomy exit site. No fevers or chills, taking PO  PE NAD Abd: soft, G tube in place but noticed that bumper had migrated. I was able to cinch down bolster to remove the slak from abd wall. I tested the tube and water dropped w/o issues. HE is not peritonitic and incisions are healing well. After adjusting G-tube no leaking areas were observed.  A/P Gastrografin study to confirm position I suspect the bolster become loose , therefore the leakage RTC next week No need for surgical intervention at this time  I also reviewed and saw the pt again after gastrografin study was completed. Study shows no leak and tube within stomach, tube distal body and he does have an ofodd shape and small stomach. Instructed the pt about positional changes, and added reglan and PPI to improve gastric motility. We will see him next week in f/u.  If has persistent issues may need to upsize tube into G-J tube

## 2020-04-18 NOTE — Patient Instructions (Addendum)
Please go directly to Radiology department at [facility]. Please see your follow up appointment listed below.

## 2020-04-18 NOTE — Addendum Note (Signed)
Addended by: Celene Kras on: 04/18/2020 04:30 PM   Modules accepted: Orders

## 2020-04-19 ENCOUNTER — Ambulatory Visit
Admission: RE | Admit: 2020-04-19 | Discharge: 2020-04-19 | Disposition: A | Discharge status: Home / Self Care | Payer: Medicare Other | Source: Ambulatory Visit | Attending: Radiation Oncology | Admitting: Radiation Oncology

## 2020-04-19 DIAGNOSIS — Z510 Encounter for antineoplastic radiation therapy: Secondary | ICD-10-CM | POA: Diagnosis not present

## 2020-04-20 ENCOUNTER — Ambulatory Visit
Admission: RE | Admit: 2020-04-20 | Discharge: 2020-04-20 | Disposition: A | Discharge status: Home / Self Care | Payer: Medicare Other | Source: Ambulatory Visit | Attending: Radiation Oncology | Admitting: Radiation Oncology

## 2020-04-20 DIAGNOSIS — Z510 Encounter for antineoplastic radiation therapy: Secondary | ICD-10-CM | POA: Diagnosis not present

## 2020-04-23 ENCOUNTER — Encounter: Payer: Medicare Other | Admitting: Surgery

## 2020-04-23 ENCOUNTER — Ambulatory Visit
Admission: RE | Admit: 2020-04-23 | Discharge: 2020-04-23 | Disposition: A | Discharge status: Home / Self Care | Payer: Medicare Other | Source: Ambulatory Visit | Attending: Radiation Oncology | Admitting: Radiation Oncology

## 2020-04-23 ENCOUNTER — Inpatient Hospital Stay (HOSPITAL_BASED_OUTPATIENT_CLINIC_OR_DEPARTMENT_OTHER): Payer: Medicare Other | Admitting: Oncology

## 2020-04-23 ENCOUNTER — Encounter: Payer: Self-pay | Admitting: Oncology

## 2020-04-23 ENCOUNTER — Inpatient Hospital Stay: Payer: Medicare Other | Attending: Oncology

## 2020-04-23 ENCOUNTER — Inpatient Hospital Stay: Payer: Medicare Other

## 2020-04-23 ENCOUNTER — Other Ambulatory Visit: Payer: Self-pay

## 2020-04-23 VITALS — BP 123/83 | HR 67 | Temp 97.3°F | Resp 16 | Wt 148.0 lb

## 2020-04-23 DIAGNOSIS — K1230 Oral mucositis (ulcerative), unspecified: Secondary | ICD-10-CM | POA: Diagnosis not present

## 2020-04-23 DIAGNOSIS — C109 Malignant neoplasm of oropharynx, unspecified: Secondary | ICD-10-CM

## 2020-04-23 DIAGNOSIS — Z931 Gastrostomy status: Secondary | ICD-10-CM | POA: Insufficient documentation

## 2020-04-23 DIAGNOSIS — Z510 Encounter for antineoplastic radiation therapy: Secondary | ICD-10-CM | POA: Diagnosis not present

## 2020-04-23 DIAGNOSIS — Z5111 Encounter for antineoplastic chemotherapy: Secondary | ICD-10-CM | POA: Diagnosis not present

## 2020-04-23 DIAGNOSIS — Y842 Radiological procedure and radiotherapy as the cause of abnormal reaction of the patient, or of later complication, without mention of misadventure at the time of the procedure: Secondary | ICD-10-CM | POA: Diagnosis not present

## 2020-04-23 DIAGNOSIS — Z95828 Presence of other vascular implants and grafts: Secondary | ICD-10-CM

## 2020-04-23 DIAGNOSIS — F17210 Nicotine dependence, cigarettes, uncomplicated: Secondary | ICD-10-CM | POA: Insufficient documentation

## 2020-04-23 DIAGNOSIS — C099 Malignant neoplasm of tonsil, unspecified: Secondary | ICD-10-CM | POA: Insufficient documentation

## 2020-04-23 LAB — COMPREHENSIVE METABOLIC PANEL
ALT: 27 U/L (ref 0–44)
AST: 25 U/L (ref 15–41)
Albumin: 3.4 g/dL — ABNORMAL LOW (ref 3.5–5.0)
Alkaline Phosphatase: 63 U/L (ref 38–126)
Anion gap: 8 (ref 5–15)
BUN: 28 mg/dL — ABNORMAL HIGH (ref 8–23)
CO2: 27 mmol/L (ref 22–32)
Calcium: 9 mg/dL (ref 8.9–10.3)
Chloride: 104 mmol/L (ref 98–111)
Creatinine, Ser: 0.8 mg/dL (ref 0.61–1.24)
GFR, Estimated: >60 mL/min (ref >60)
Glucose, Bld: 82 mg/dL (ref 70–99)
Potassium: 3.8 mmol/L (ref 3.5–5.1)
Sodium: 139 mmol/L (ref 135–145)
Total Bilirubin: 0.4 mg/dL (ref 0.3–1.2)
Total Protein: 6.9 g/dL (ref 6.5–8.1)

## 2020-04-23 LAB — CBC WITH DIFFERENTIAL/PLATELET
Abs Immature Granulocytes: 0.1 10*3/uL — ABNORMAL HIGH (ref 0.00–0.07)
Basophils Absolute: 0 10*3/uL (ref 0.0–0.1)
Basophils Relative: 0 %
Eosinophils Absolute: 0.1 10*3/uL (ref 0.0–0.5)
Eosinophils Relative: 1 %
HCT: 38.1 % — ABNORMAL LOW (ref 39.0–52.0)
Hemoglobin: 13.6 g/dL (ref 13.0–17.0)
Immature Granulocytes: 1 %
Lymphocytes Relative: 32 %
Lymphs Abs: 2.6 10*3/uL (ref 0.7–4.0)
MCH: 35.1 pg — ABNORMAL HIGH (ref 26.0–34.0)
MCHC: 35.7 g/dL (ref 30.0–36.0)
MCV: 98.4 fL (ref 80.0–100.0)
Monocytes Absolute: 0.8 10*3/uL (ref 0.1–1.0)
Monocytes Relative: 10 %
Neutro Abs: 4.6 10*3/uL (ref 1.7–7.7)
Neutrophils Relative %: 56 %
Platelets: 324 10*3/uL (ref 150–400)
RBC: 3.87 MIL/uL — ABNORMAL LOW (ref 4.22–5.81)
RDW: 13.8 % (ref 11.5–15.5)
WBC: 8.1 10*3/uL (ref 4.0–10.5)
nRBC: 0 % (ref 0.0–0.2)

## 2020-04-23 LAB — MAGNESIUM: Magnesium: 2 mg/dL (ref 1.7–2.4)

## 2020-04-23 LAB — PHOSPHORUS: Phosphorus: 3.7 mg/dL (ref 2.5–4.6)

## 2020-04-23 MED ORDER — HEPARIN SOD (PORK) LOCK FLUSH 100 UNIT/ML IV SOLN
INTRAVENOUS | Status: AC
  Filled 2020-04-23: qty 5

## 2020-04-23 MED ORDER — SODIUM CHLORIDE 0.9 % IV SOLN
150.0000 mg | Freq: Once | INTRAVENOUS | Status: AC
  Administered 2020-04-23: 150 mg via INTRAVENOUS
  Filled 2020-04-23: qty 150

## 2020-04-23 MED ORDER — SODIUM CHLORIDE 0.9 % IV SOLN
Freq: Once | INTRAVENOUS | Status: AC
  Filled 2020-04-23: qty 250

## 2020-04-23 MED ORDER — PALONOSETRON HCL INJECTION 0.25 MG/5ML
0.2500 mg | Freq: Once | INTRAVENOUS | Status: AC
  Administered 2020-04-23: 0.25 mg via INTRAVENOUS
  Filled 2020-04-23: qty 5

## 2020-04-23 MED ORDER — SODIUM CHLORIDE 0.9% FLUSH
10.0000 mL | INTRAVENOUS | Status: DC | PRN
  Filled 2020-04-23: qty 10

## 2020-04-23 MED ORDER — HEPARIN SOD (PORK) LOCK FLUSH 100 UNIT/ML IV SOLN
500.0000 [IU] | Freq: Once | INTRAVENOUS | Status: AC | PRN
  Administered 2020-04-23: 500 [IU]
  Filled 2020-04-23: qty 5

## 2020-04-23 MED ORDER — SODIUM CHLORIDE 0.9% FLUSH
10.0000 mL | INTRAVENOUS | Status: AC | PRN
  Administered 2020-04-23: 10 mL via INTRAVENOUS
  Filled 2020-04-23: qty 10

## 2020-04-23 MED ORDER — SODIUM CHLORIDE 0.9 % IV SOLN
Freq: Once | INTRAVENOUS | Status: AC
  Filled 2020-04-23: qty 1000

## 2020-04-23 MED ORDER — SODIUM CHLORIDE 0.9 % IV SOLN
10.0000 mg | Freq: Once | INTRAVENOUS | Status: AC
  Administered 2020-04-23: 10 mg via INTRAVENOUS
  Filled 2020-04-23: qty 10

## 2020-04-23 MED ORDER — SODIUM CHLORIDE 0.9 % IV SOLN
40.0000 mg/m2 | Freq: Once | INTRAVENOUS | Status: AC
  Administered 2020-04-23: 75 mg via INTRAVENOUS
  Filled 2020-04-23: qty 75

## 2020-04-23 NOTE — Progress Notes (Signed)
1250: Total urine output 125 cc, Dr. Janese Banks aware, per Dr. Janese Banks proceed with Cisplatin treatment, NNO at this time.

## 2020-04-24 ENCOUNTER — Ambulatory Visit
Admission: RE | Admit: 2020-04-24 | Discharge: 2020-04-24 | Disposition: A | Discharge status: Home / Self Care | Payer: Medicare Other | Source: Ambulatory Visit | Attending: Radiation Oncology | Admitting: Radiation Oncology

## 2020-04-24 ENCOUNTER — Encounter (INDEPENDENT_AMBULATORY_CARE_PROVIDER_SITE_OTHER): Payer: Self-pay | Admitting: Vascular Surgery

## 2020-04-24 DIAGNOSIS — Z510 Encounter for antineoplastic radiation therapy: Secondary | ICD-10-CM | POA: Diagnosis not present

## 2020-04-25 ENCOUNTER — Ambulatory Visit
Admission: RE | Admit: 2020-04-25 | Discharge: 2020-04-25 | Disposition: A | Discharge status: Home / Self Care | Payer: Medicare Other | Source: Ambulatory Visit | Attending: Radiation Oncology | Admitting: Radiation Oncology

## 2020-04-25 ENCOUNTER — Encounter: Payer: Self-pay | Admitting: Surgery

## 2020-04-25 ENCOUNTER — Ambulatory Visit (INDEPENDENT_AMBULATORY_CARE_PROVIDER_SITE_OTHER): Payer: Medicare Other | Admitting: Surgery

## 2020-04-25 ENCOUNTER — Other Ambulatory Visit: Payer: Self-pay

## 2020-04-25 VITALS — BP 133/86 | HR 53 | Temp 98.3°F | Ht 70.0 in | Wt 148.0 lb

## 2020-04-25 DIAGNOSIS — Z510 Encounter for antineoplastic radiation therapy: Secondary | ICD-10-CM | POA: Diagnosis not present

## 2020-04-25 DIAGNOSIS — Z09 Encounter for follow-up examination after completed treatment for conditions other than malignant neoplasm: Secondary | ICD-10-CM

## 2020-04-25 NOTE — Patient Instructions (Signed)
   Follow-up with our office as needed.  Please call and ask to speak with a nurse if you develop questions or concerns.  

## 2020-04-26 ENCOUNTER — Telehealth: Payer: Self-pay

## 2020-04-26 ENCOUNTER — Other Ambulatory Visit: Payer: Self-pay | Admitting: *Deleted

## 2020-04-26 ENCOUNTER — Ambulatory Visit
Admission: RE | Admit: 2020-04-26 | Discharge: 2020-04-26 | Disposition: A | Discharge status: Home / Self Care | Payer: Medicare Other | Source: Ambulatory Visit | Attending: Radiation Oncology | Admitting: Radiation Oncology

## 2020-04-26 DIAGNOSIS — Z510 Encounter for antineoplastic radiation therapy: Secondary | ICD-10-CM | POA: Diagnosis not present

## 2020-04-26 MED ORDER — POLYETHYLENE GLYCOL 3350 17 GM/SCOOP PO POWD: 1.0000 | Freq: Two times a day (BID) | ORAL | 1 refills | Status: AC

## 2020-04-26 MED ORDER — SENNOSIDES 8.8 MG/ML PO LIQD: 10.0000 mL | Freq: Every day | ORAL | 1 refills | Status: DC

## 2020-04-26 NOTE — Telephone Encounter (Signed)
Nutrition Follow-up:  Patient with tonsil cancer stage IV, HPV+.  Patient receiving concurrent chemotherapy and radiation therapy.  PEG placed on 10/20.   Spoke with Ronnie Ingram, significant other via phone as patient with short term memory problems.  Ronnie Ingram said that patient has not been using the feeding tube since Saturday night for feeding due to leaking around site.  She thinks he is scared to use tube now due to leaking and constipation.  Noted f/u with surgeon yesterday.  Ronnie Ingram reports no leakage at this time.  Says that he is eating whatever he wants.      Medications: reviewed  Labs: reviewed  Anthropometrics:   Weight 148 lb decreased from 156 lb on 10/25  162 lb on 9/27   Estimated Energy Needs  Kcals: 2100-2485 Protein: 105-124 g Fluid: > 2.1 L  NUTRITION DIAGNOSIS: Inadequate oral intake continues    INTERVENTION:  Recommend restarting tube feeding of osmolite 1.5 at least 3 cartons per day between oral meals.  Flush with 60 ml of water before and after.   If patient chooses not to use tube must flush with at least 60 ml of water 1-2 times daily to keep tube patent.   Sent message to Dr Ronnie Ingram regarding patient needing bowel regimen for constipation.  Dr Ronnie Ingram team will follow-up with Ronnie Ingram.     MONITORING, EVALUATION, GOAL: weight trends, intake, tube feeding tolerance   NEXT VISIT: Nov 8 during infusion  Ronnie Ingram, [place], [place] Registered Dietitian [phone] (mobile)

## 2020-04-26 NOTE — Progress Notes (Signed)
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 CT soft tissue neck showed a hyperenhancement of left palatine tonsil measuring 2.8 cm. Enlarged left level 2A lymph node measuring 3.4 cm in long axis and a smaller but conspicuous level 2B lymph node measuring 9 mm. PET CT scan showed marked hypermetabolism in the left tonsillar region with an SUV of 8.8. SUV 4.2 at the level 2 lymph node. The smaller level 2B lymph node is superimposed on the dominant necrotic node. 4.8 x 4.4 cm abdominal aortic aneurysm. 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 Baruch Gouty and plan is for concurrent chemoradiation.   Treatment interrupted by worsening mucositis requiring hospitalization and feeding tube placement.  Interval history-patient states that his mouth pain is currently under good control and he is able to swallow and he is also using his PEG tube for tube feeds.  He is concerned about leakage around his PEG tube site.  Reports having some problems with bowel movements and feeling constipated  ECOG PS- 1 Pain scale- 3 Opioid associated constipation- yes  Review of systems- Review of Systems  Constitutional: Positive for malaise/fatigue. Negative for chills, fever and weight loss.  HENT: Negative for congestion, ear discharge and nosebleeds.   Eyes: Negative for  blurred vision.  Respiratory: Negative for cough, hemoptysis, sputum production, shortness of breath and wheezing.   Cardiovascular: Negative for chest pain, palpitations, orthopnea and claudication.  Gastrointestinal: Positive for constipation. Negative for abdominal pain, blood in stool, diarrhea, heartburn, melena, nausea and vomiting.  Genitourinary: Negative for dysuria, flank pain, frequency, hematuria and urgency.  Musculoskeletal: Negative for back pain, joint pain and myalgias.  Skin: Negative for rash.  Neurological: Negative for dizziness, tingling, focal weakness, seizures, weakness and headaches.  Endo/Heme/Allergies: Does not bruise/bleed easily.  Psychiatric/Behavioral: Negative for depression and suicidal ideas. The patient does not have insomnia.       No Known Allergies   Past Medical History:  Diagnosis Date  . Arthritis   . Asthma   . Brain bleed ([place])    [place] years ago after falling off ladder  . COPD (chronic obstructive pulmonary disease) ([place])   . Diverticulitis   . Hypertension   . 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 APPENDECTOMY    . FRACTURE SURGERY Left    ORIF left forearm  . HERNIA REPAIR Left    inguinal  . HERNIA REPAIR     abd  . INGUINAL HERNIA REPAIR Right 09/06/2018   Procedure: HERNIA REPAIR INGUINAL ADULT, RIGHT;  Surgeon: Herbert Pun, MD;  Location: [facility] ORS;  Service: General;  Laterality: Right;  . PORTA CATH INSERTION N/A 03/14/2020   Procedure: PORTA CATH INSERTION;  Surgeon: Algernon Huxley, MD;  Location: [facility];  Service: Cardiovascular;  Laterality: N/A;    Social History   Socioeconomic History  . Marital status: Significant Other  Spouse name: Not on file  . Number of children: Not on file  . Years of education: Not on file  . Highest education level: Not on file  Occupational History  . Not on file  Tobacco Use  . Smoking status: Current Every Day  Smoker    Packs/day: 0.50    Years: 30.00    Pack years: 15.00    Types: Cigarettes  . Smokeless tobacco: Never Used  Vaping Use  . Vaping Use: Never used  Substance and Sexual Activity  . Alcohol use: Yes    Alcohol/week: 12.0 standard drinks    Types: 12 Cans of beer per week  . Drug use: Yes    Types: Marijuana  . Sexual activity: Not on file  Other Topics Concern  . Not on file  Social History Narrative  . Not on file   Social Determinants of Health   Financial Resource Strain:   . Difficulty of Paying Living Expenses: Not on file  Food Insecurity:   . Worried About Charity fundraiser in the Last Year: Not on file  . Ran Out of Food in the Last Year: Not on file  Transportation Needs:   . Lack of Transportation (Medical): Not on file  . Lack of Transportation (Non-Medical): Not on file  Physical Activity:   . Days of Exercise per Week: Not on file  . Minutes of Exercise per Session: Not on file  Stress:   . Feeling of Stress : Not on file  Social Connections:   . Frequency of Communication with Friends and Family: Not on file  . Frequency of Social Gatherings with Friends and Family: Not on file  . Attends Religious Services: Not on file  . Active Member of Clubs or Organizations: Not on file  . Attends Archivist Meetings: Not on file  . Marital Status: Not on file  Intimate Partner Violence:   . Fear of Current or Ex-Partner: Not on file  . Emotionally Abused: Not on file  . Physically Abused: Not on file  . Sexually Abused: Not on file    No family history on file.   Current Outpatient Medications:  .  Clotrimazole 1 % OINT, Apply a thin layer to the affected area twice daily as needed. (Patient taking differently: Apply 1 application topically daily as needed (rash). ), Disp: 30 g, Rfl: 0 .  gabapentin (NEURONTIN) 300 MG capsule, Take 300 mg by mouth 2 (two) times daily as needed (pain). , Disp: , Rfl:  .  lidocaine-prilocaine (EMLA) cream,  Apply to affected area once, Disp: 30 g, Rfl: 3 .  lisinopril (PRINIVIL,ZESTRIL) 40 MG tablet, Take 40 mg by mouth daily. , Disp: , Rfl:  .  magic mouthwash w/lidocaine SOLN, Take 5 mLs by mouth 4 (four) times daily as needed for mouth pain., Disp: 340 mL, Rfl: 1 .  metoCLOPramide (REGLAN) 5 MG tablet, Take 1 tablet (5 mg total) by mouth every 8 (eight) hours as needed for nausea., Disp: 30 tablet, Rfl: 0 .  montelukast (SINGULAIR) 10 MG tablet, Take 10 mg by mouth daily., Disp: , Rfl:  .  morphine (ROXANOL) 20 MG/ML concentrated solution, Take 0.5 mLs (10 mg total) by mouth every 4 (four) hours as needed for severe pain., Disp: 30 mL, Rfl: 0 .  Nutritional Supplements (FEEDING SUPPLEMENT, OSMOLITE 1.5 CAL,) LIQD, Place 120 mLs into feeding tube 6 (six) times daily., Disp: 237 mL, Rfl: 3 .  Nutritional Supplements (FEEDING SUPPLEMENT, PROSOURCE TF,)  liquid, Place 45 mLs into feeding tube 2 (two) times daily., Disp: 2700 mL, Rfl: 1 .  omeprazole (PRILOSEC) 40 MG capsule, Take 1 capsule (40 mg total) by mouth daily., Disp: 30 capsule, Rfl: 0 .  pantoprazole (PROTONIX) 40 MG tablet, Take 1 tablet (40 mg total) by mouth daily., Disp: 30 tablet, Rfl: 0 .  tiotropium (SPIRIVA) 18 MCG inhalation capsule, Place 18 mcg into inhaler and inhale daily. , Disp: , Rfl:  .  Water For Irrigation, Sterile (FREE WATER) SOLN, Place 120 mLs into feeding tube 6 (six) times daily., Disp: 1000 mL, Rfl: 1 .  polyethylene glycol powder (MIRALAX) 17 GM/SCOOP powder, Take 255 g by mouth in the morning and at bedtime for 60 doses., Disp: 15300 g, Rfl: 1 .  Sennosides 8.8 MG/ML LIQD, Take 10 mLs (88 mg total) by mouth daily., Disp: 300 mL, Rfl: 1 No current facility-administered medications for this visit.  Facility-Administered Medications Ordered in Other Visits:  .  sodium chloride flush (NS) 0.9 % injection 10 mL, 10 mL, Intravenous, PRN, Lloyd Huger, MD, 10 mL at 04/23/20 0850  Physical exam:  Vitals:    04/23/20 0916  BP: 123/83  Pulse: 67  Resp: 16  Temp: (!) 97.3 F (36.3 C)  TempSrc: Tympanic  SpO2: 95%  Weight: 148 lb (67.1 kg)   Physical Exam Constitutional:      General: He is not in acute distress. HENT:     Mouth/Throat:     Mouth: Mucous membranes are moist.     Pharynx: Oropharynx is clear.  Eyes:     Pupils: Pupils are equal, round, and reactive to light.  Cardiovascular:     Rate and Rhythm: Normal rate and regular rhythm.     Heart sounds: Normal heart sounds.  Pulmonary:     Effort: Pulmonary effort is normal.     Breath sounds: Normal breath sounds.  Abdominal:     General: Bowel sounds are normal.     Palpations: Abdomen is soft.     Comments: PEG tube in place and some leakage seen around the PEG tube site.  No evidence of infection  Skin:    General: Skin is warm and dry.  Neurological:     Mental Status: He is alert and oriented to person, place, and time.      CMP Latest Ref Rng & Units 04/23/2020  Glucose 70 - 99 mg/dL 82  BUN 8 - 23 mg/dL 28(H)  Creatinine 0.61 - 1.24 mg/dL 0.80  Sodium 135 - 145 mmol/L 139  Potassium 3.5 - 5.1 mmol/L 3.8  Chloride 98 - 111 mmol/L 104  CO2 22 - 32 mmol/L 27  Calcium 8.9 - 10.3 mg/dL 9.0  Total Protein 6.5 - 8.1 g/dL 6.9  Total Bilirubin 0.3 - 1.2 mg/dL 0.4  Alkaline Phos 38 - 126 U/L 63  AST 15 - 41 U/L 25  ALT 0 - 44 U/L 27   CBC Latest Ref Rng & Units 04/23/2020  WBC 4.0 - 10.5 K/uL 8.1  Hemoglobin 13.0 - 17.0 g/dL 13.6  Hematocrit 39 - 52 % 38.1(L)  Platelets 150 - 400 K/uL 324    No images are attached to the encounter.  DG Perc Gastrostomy Tube Insert W/Fluoro  Result Date: 04/18/2020 INDICATION: 64 year old with squamous cell carcinoma of the oropharynx. Recently placed gastrostomy tube on 04/11/2020 with persistent leaking. EXAM: GASTROSTOMY TUBE INJECTION WITH FLUOROSCOPY MEDICATIONS: None ANESTHESIA/SEDATION: None CONTRAST:  15 mL Omnipaque 300-administered into the gastric lumen.  FLUOROSCOPY TIME:  Fluoroscopy Time: 2 minutes, 45 mGy COMPLICATIONS: None immediate. PROCEDURE: Patient was placed supine on the fluoroscopic table. Dilute contrast was injected through the balloon retention gastrostomy tube. Patient was rotated on the table and was placed in a semi upright view. Tube was flushed with saline at the end of the procedure. FINDINGS: Patient has an 69 French gastrostomy tube with retention balloon. The insertion site appears to be in the distal stomach near the antrum. Small amount of contrast draining into the proximal duodenum. Drainage into the duodenum appeared to improve with the patient in a right lateral decubitus position. No evidence for extravasation outside of the stomach while injecting under fluoroscopy. IMPRESSION: 1. Gastrostomy tube is positioned within the stomach. Insertion site is near the distal stomach as described. 2. Slightly delayed emptying from the stomach. If the patient continues to have problems with leakage, consider up sizing or conversion to [place] feeding tube. Electronically Signed   By: Markus Daft M.D.   On: 04/18/2020 16:27     Assessment and plan- Patient is a 64 y.o. 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  Counts are otherwise okay to proceed with cycle 5 of weekly cisplatin chemotherapy today.  We will see how he tolerates chemoradiation after his break of 2 weeks from recent hospitalization.  If mucositis recurs and his pain gets worse I will consider stopping chemotherapy altogether while he will continue with radiation.  I will see him back in 1 week's time for cycle 6  Patient will continue PEG tube feeds in addition to oral intake.He is still losing weight and is down to 148 pounds today from 154 pounds last week.  He will also be seeing Dr.  Adora Fridge to discuss leakage around his PEG tube.  Constipation: I have advised him to take MiraLAX and senna for this preferably through his PEG tube   Visit Diagnosis 1. Encounter for antineoplastic chemotherapy   2. Squamous cell carcinoma of oropharynx ([place])      Dr. Randa Evens, MD, MPH [facility] [phone] 04/26/2020 9:00 PM

## 2020-04-26 NOTE — Progress Notes (Signed)
S/p robotic g tube Doing much better and leak stopped after he was able to keep the tubing portion of the g tube around 3.5-4cm mark Taking Po, no fevers or chills  PE NAD Abd: soft, g tube in place w/o leaks. No infection . Incisions c./d/i, no peritonitis.  A/P Doing well Instructed pt about g tube care No heavy lifting

## 2020-04-26 NOTE — Telephone Encounter (Signed)
Called [facility] and went over what dr Janese Banks wanted to try for his constipation.  She recommends 10 ml senna liquid once a day and then miralax dose twice a day. If there is no bowel movement then call and we will try another med. [name] spoke about his insurance does not have pharmacy benefits and I spoke to [place] who will pay for senna and miralax and it will go to [facility]. I called back and told [name] this info and she will go and pick it up.

## 2020-04-27 ENCOUNTER — Ambulatory Visit
Admission: RE | Admit: 2020-04-27 | Discharge: 2020-04-27 | Disposition: A | Discharge status: Home / Self Care | Payer: Medicare Other | Source: Ambulatory Visit | Attending: Radiation Oncology | Admitting: Radiation Oncology

## 2020-04-27 ENCOUNTER — Telehealth: Payer: Self-pay | Admitting: *Deleted

## 2020-04-27 DIAGNOSIS — Z510 Encounter for antineoplastic radiation therapy: Secondary | ICD-10-CM | POA: Diagnosis not present

## 2020-04-27 NOTE — Telephone Encounter (Signed)
I called [name] to talk to her about Mr. Holzheimer. He is having a lot of constipation. Dr. Janese Banks suggested that he take either 2 tablets of Senokot once a day, or we can get Senokot liquid and he can take 2 doses at 1 time a day. And also add MiraLAX twice a day. When I called [name] she wanted to have the Senokot liquid. She said she already had some MiraLAX at home. She states that they have to pay out of the pocket for all meds because he doesn't have a medication prescription plan. She said that she had called Barnabas Lister a couple weeks ago and had not heard back from him. I told her I would check with Barnabas Lister if I could get up with him because he is busy with the whole clinic. I called Barnabas Lister and he was okay with paying for the MiraLAX as well as the Senokot liquid and it would be sent to [facility]. I explained to [name] where the location of [facility] is. Also I called [facility] they do not have the liquid Senokot but they would have it by Friday, November 5. I called [name] back and let her know and she said she'll use the MiraLAX tonight but then tomorrow she will go and get both products from [facility].

## 2020-04-30 ENCOUNTER — Other Ambulatory Visit: Payer: Self-pay | Admitting: *Deleted

## 2020-04-30 ENCOUNTER — Encounter: Payer: Self-pay | Admitting: Oncology

## 2020-04-30 ENCOUNTER — Inpatient Hospital Stay: Payer: Medicare Other

## 2020-04-30 ENCOUNTER — Ambulatory Visit
Admission: RE | Admit: 2020-04-30 | Discharge: 2020-04-30 | Disposition: A | Discharge status: Home / Self Care | Payer: Medicare Other | Source: Ambulatory Visit | Attending: Radiation Oncology | Admitting: Radiation Oncology

## 2020-04-30 ENCOUNTER — Inpatient Hospital Stay (HOSPITAL_BASED_OUTPATIENT_CLINIC_OR_DEPARTMENT_OTHER): Payer: Medicare Other | Admitting: Oncology

## 2020-04-30 VITALS — BP 99/66 | HR 54 | Temp 98.6°F | Resp 16 | Ht 70.0 in | Wt 146.7 lb

## 2020-04-30 DIAGNOSIS — Z5111 Encounter for antineoplastic chemotherapy: Secondary | ICD-10-CM | POA: Diagnosis not present

## 2020-04-30 DIAGNOSIS — Z510 Encounter for antineoplastic radiation therapy: Secondary | ICD-10-CM | POA: Diagnosis not present

## 2020-04-30 DIAGNOSIS — C109 Malignant neoplasm of oropharynx, unspecified: Secondary | ICD-10-CM

## 2020-04-30 DIAGNOSIS — K1230 Oral mucositis (ulcerative), unspecified: Secondary | ICD-10-CM

## 2020-04-30 LAB — CBC WITH DIFFERENTIAL/PLATELET
Abs Immature Granulocytes: 0.03 10*3/uL (ref 0.00–0.07)
Basophils Absolute: 0.1 10*3/uL (ref 0.0–0.1)
Basophils Relative: 1 %
Eosinophils Absolute: 0.1 10*3/uL (ref 0.0–0.5)
Eosinophils Relative: 2 %
HCT: 35 % — ABNORMAL LOW (ref 39.0–52.0)
Hemoglobin: 12.4 g/dL — ABNORMAL LOW (ref 13.0–17.0)
Immature Granulocytes: 0 %
Lymphocytes Relative: 23 %
Lymphs Abs: 1.5 10*3/uL (ref 0.7–4.0)
MCH: 35 pg — ABNORMAL HIGH (ref 26.0–34.0)
MCHC: 35.4 g/dL (ref 30.0–36.0)
MCV: 98.9 fL (ref 80.0–100.0)
Monocytes Absolute: 0.6 10*3/uL (ref 0.1–1.0)
Monocytes Relative: 9 %
Neutro Abs: 4.4 10*3/uL (ref 1.7–7.7)
Neutrophils Relative %: 65 %
Platelets: 171 10*3/uL (ref 150–400)
RBC: 3.54 MIL/uL — ABNORMAL LOW (ref 4.22–5.81)
RDW: 13.8 % (ref 11.5–15.5)
WBC: 6.7 10*3/uL (ref 4.0–10.5)
nRBC: 0 % (ref 0.0–0.2)

## 2020-04-30 LAB — COMPREHENSIVE METABOLIC PANEL
ALT: 25 U/L (ref 0–44)
AST: 28 U/L (ref 15–41)
Albumin: 3.4 g/dL — ABNORMAL LOW (ref 3.5–5.0)
Alkaline Phosphatase: 71 U/L (ref 38–126)
Anion gap: 8 (ref 5–15)
BUN: 18 mg/dL (ref 8–23)
CO2: 28 mmol/L (ref 22–32)
Calcium: 9.4 mg/dL (ref 8.9–10.3)
Chloride: 100 mmol/L (ref 98–111)
Creatinine, Ser: 0.66 mg/dL (ref 0.61–1.24)
GFR, Estimated: >60 mL/min (ref >60)
Glucose, Bld: 110 mg/dL — ABNORMAL HIGH (ref 70–99)
Potassium: 4 mmol/L (ref 3.5–5.1)
Sodium: 136 mmol/L (ref 135–145)
Total Bilirubin: 0.7 mg/dL (ref 0.3–1.2)
Total Protein: 6.7 g/dL (ref 6.5–8.1)

## 2020-04-30 LAB — PHOSPHORUS: Phosphorus: 5.2 mg/dL — ABNORMAL HIGH (ref 2.5–4.6)

## 2020-04-30 LAB — MAGNESIUM: Magnesium: 1.9 mg/dL (ref 1.7–2.4)

## 2020-04-30 MED ORDER — MAGIC MOUTHWASH W/LIDOCAINE: 5.0000 mL | Freq: Four times a day (QID) | ORAL | 1 refills | Status: DC | PRN

## 2020-04-30 MED ORDER — MORPHINE SULFATE (CONCENTRATE) 20 MG/ML PO SOLN
10.0000 mg | ORAL | 0 refills | Status: DC | PRN
Start: 2020-04-30 — End: 2020-05-07

## 2020-04-30 MED ORDER — HEPARIN SOD (PORK) LOCK FLUSH 100 UNIT/ML IV SOLN
500.0000 [IU] | Freq: Once | INTRAVENOUS | Status: AC
  Administered 2020-04-30: 500 [IU] via INTRAVENOUS
  Filled 2020-04-30: qty 5

## 2020-04-30 MED ORDER — SODIUM CHLORIDE 0.9% FLUSH
10.0000 mL | Freq: Once | INTRAVENOUS | Status: AC
  Administered 2020-04-30: 10 mL via INTRAVENOUS
  Filled 2020-04-30: qty 10

## 2020-04-30 NOTE — Progress Notes (Signed)
Doing better on tube feedings per pt. Still having issues with mouth sores.

## 2020-04-30 NOTE — Progress Notes (Signed)
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 CT soft tissue neck showed a hyperenhancement of left palatine tonsil measuring 2.8 cm. Enlarged left level 2A lymph node measuring 3.4 cm in long axis and a smaller but conspicuous level 2B lymph node measuring 9 mm. PET CT scan showed marked hypermetabolism in the left tonsillar region with an SUV of 8.8. SUV 4.2 at the level 2 lymph node. The smaller level 2B lymph node is superimposed on the dominant necrotic node. 4.8 x 4.4 cm abdominal aortic aneurysm. 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 Baruch Gouty and plan is for concurrent chemoradiation.  Treatment interrupted by worsening mucositis requiring hospitalization and feeding tube placement.  Interval history-patient is using about 1-1/2-2 cartons of tube feeds but is otherwise relying on his oral intake.  Reports that his throat pain is worse especially since yesterday and he is feeling more fatigued today.  He is continuing to lose weight and is down to 146 pounds today  ECOG PS- 1 Pain scale- 5 Opioid associated constipation- no  Review of systems- Review of Systems  Constitutional: Positive for malaise/fatigue. Negative for chills, fever and weight loss.  HENT: Positive for sore throat. Negative for congestion, ear discharge and nosebleeds.   Eyes:  Negative for blurred vision.  Respiratory: Negative for cough, hemoptysis, sputum production, shortness of breath and wheezing.   Cardiovascular: Negative for chest pain, palpitations, orthopnea and claudication.  Gastrointestinal: Negative for abdominal pain, blood in stool, constipation, diarrhea, heartburn, melena, nausea and vomiting.  Genitourinary: Negative for dysuria, flank pain, frequency, hematuria and urgency.  Musculoskeletal: Negative for back pain, joint pain and myalgias.  Skin: Negative for rash.  Neurological: Negative for dizziness, tingling, focal weakness, seizures, weakness and headaches.  Endo/Heme/Allergies: Does not bruise/bleed easily.  Psychiatric/Behavioral: Negative for depression and suicidal ideas. The patient does not have insomnia.       No Known Allergies   Past Medical History:  Diagnosis Date  . Arthritis   . Asthma   . Brain bleed ([place])    [place] years ago after falling off ladder  . COPD (chronic obstructive pulmonary disease) ([place])   . Diverticulitis   . Hypertension   . Tonsil cancer [facility])      Past Surgical History:  Procedure Laterality Date  . APPENDECTOMY    . FRACTURE SURGERY Left    ORIF left forearm  . HERNIA REPAIR Left    inguinal  . HERNIA REPAIR     abd  . INGUINAL HERNIA REPAIR Right 09/06/2018   Procedure: HERNIA REPAIR INGUINAL ADULT, RIGHT;  Surgeon: Herbert Pun, MD;  Location: [facility] ORS;  Service: General;  Laterality: Right;  . PORTA CATH INSERTION N/A 03/14/2020   Procedure: PORTA CATH INSERTION;  Surgeon: Algernon Huxley, MD;  Location: [place];  Service: Cardiovascular;  Laterality: N/A;    Social History   Socioeconomic History  . Marital status: Significant Other  Spouse name: Not on file  . Number of children: Not on file  . Years of education: Not on file  . Highest education level: Not on file  Occupational History  . Not on file  Tobacco Use  . Smoking status: Current Every Day  Smoker    Packs/day: 0.50    Years: 30.00    Pack years: 15.00    Types: Cigarettes  . Smokeless tobacco: Never Used  Vaping Use  . Vaping Use: Never used  Substance and Sexual Activity  . Alcohol use: Not Currently    Alcohol/week: 0.0 standard drinks    Comment: not drank anything from 3 weeks from 04/30/2020  . Drug use: Yes    Types: Marijuana  . Sexual activity: Not Currently  Other Topics Concern  . Not on file  Social History Narrative  . Not on file   Social Determinants of Health   Financial Resource Strain:   . Difficulty of Paying Living Expenses: Not on file  Food Insecurity:   . Worried About Charity fundraiser in the Last Year: Not on file  . Ran Out of Food in the Last Year: Not on file  Transportation Needs:   . Lack of Transportation (Medical): Not on file  . Lack of Transportation (Non-Medical): Not on file  Physical Activity:   . Days of Exercise per Week: Not on file  . Minutes of Exercise per Session: Not on file  Stress:   . Feeling of Stress : Not on file  Social Connections:   . Frequency of Communication with Friends and Family: Not on file  . Frequency of Social Gatherings with Friends and Family: Not on file  . Attends Religious Services: Not on file  . Active Member of Clubs or Organizations: Not on file  . Attends Archivist Meetings: Not on file  . Marital Status: Not on file  Intimate Partner Violence:   . Fear of Current or Ex-Partner: Not on file  . Emotionally Abused: Not on file  . Physically Abused: Not on file  . Sexually Abused: Not on file    History reviewed. No pertinent family history.   Current Outpatient Medications:  .  gabapentin (NEURONTIN) 300 MG capsule, Take 300 mg by mouth 2 (two) times daily as needed (pain). , Disp: , Rfl:  .  lidocaine-prilocaine (EMLA) cream, Apply to affected area once, Disp: 30 g, Rfl: 3 .  lisinopril (PRINIVIL,ZESTRIL) 40 MG tablet, Take 40 mg by mouth daily. , Disp: , Rfl:  .   metoCLOPramide (REGLAN) 5 MG tablet, Take 1 tablet (5 mg total) by mouth every 8 (eight) hours as needed for nausea., Disp: 30 tablet, Rfl: 0 .  montelukast (SINGULAIR) 10 MG tablet, Take 10 mg by mouth daily., Disp: , Rfl:  .  Nutritional Supplements (FEEDING SUPPLEMENT, PROSOURCE TF,) liquid, Place 45 mLs into feeding tube 2 (two) times daily., Disp: 2700 mL, Rfl: 1 .  omeprazole (PRILOSEC) 40 MG capsule, Take 1 capsule (40 mg total) by mouth daily., Disp: 30 capsule, Rfl: 0 .  pantoprazole (PROTONIX) 40 MG tablet, Take 1 tablet (40 mg total) by mouth daily., Disp: 30 tablet, Rfl: 0 .  polyethylene glycol powder (MIRALAX) 17 GM/SCOOP powder, Take 255 g by mouth in the morning and at bedtime for 60 doses., Disp: 15300 g, Rfl: 1 .  Sennosides 8.8 MG/ML LIQD, Take 10 mLs (88 mg total) by mouth daily., Disp: 300 mL, Rfl: 1 .  tiotropium (SPIRIVA) 18 MCG  inhalation capsule, Place 18 mcg into inhaler and inhale daily. , Disp: , Rfl:  .  Water For Irrigation, Sterile (FREE WATER) SOLN, Place 120 mLs into feeding tube 6 (six) times daily., Disp: 1000 mL, Rfl: 1 .  Clotrimazole 1 % OINT, Apply a thin layer to the affected area twice daily as needed. (Patient not taking: Reported on 04/30/2020), Disp: 30 g, Rfl: 0 .  magic mouthwash w/lidocaine SOLN, Take 5 mLs by mouth 4 (four) times daily as needed for mouth pain., Disp: 340 mL, Rfl: 1 .  morphine (ROXANOL) 20 MG/ML concentrated solution, Take 0.5 mLs (10 mg total) by mouth every 4 (four) hours as needed for severe pain. Please pay for on byrd fund per [employer], Disp: 30 mL, Rfl: 0 .  Nutritional Supplements (FEEDING SUPPLEMENT, OSMOLITE 1.5 CAL,) LIQD, Place 120 mLs into feeding tube 6 (six) times daily. (Patient not taking: Reported on 04/30/2020), Disp: 237 mL, Rfl: 3 No current facility-administered medications for this visit.  Facility-Administered Medications Ordered in Other Visits:  .  sodium chloride flush (NS) 0.9 % injection 10 mL, 10 mL,  Intravenous, PRN, Lloyd Huger, MD, 10 mL at 04/23/20 0850  Physical exam:  Vitals:   04/30/20 0916  BP: 99/66  Pulse: (!) 54  Resp: 16  Temp: 98.6 F (37 C)  TempSrc: Oral  Weight: 146 lb 11.2 oz (66.5 kg)  Height: 5\' 10"  (1.778 m)   Physical Exam HENT:     Head: Normocephalic and atraumatic.     Mouth/Throat:     Comments: Grade 2 mucositis noted with confluent erythema without overt ulcerations Eyes:     Pupils: Pupils are equal, round, and reactive to light.  Cardiovascular:     Rate and Rhythm: Normal rate and regular rhythm.     Heart sounds: Normal heart sounds.  Pulmonary:     Effort: Pulmonary effort is normal.     Breath sounds: Normal breath sounds.  Abdominal:     General: Bowel sounds are normal.     Palpations: Abdomen is soft.     Comments: PEG tube in place  Lymphadenopathy:     Comments: Left-sided cervical lymph node is palpable at about 1 cm  Skin:    General: Skin is warm and dry.  Neurological:     Mental Status: He is alert and oriented to person, place, and time.      CMP Latest Ref Rng & Units 04/30/2020  Glucose 70 - 99 mg/dL 110(H)  BUN 8 - 23 mg/dL 18  Creatinine 0.61 - 1.24 mg/dL 0.66  Sodium 135 - 145 mmol/L 136  Potassium 3.5 - 5.1 mmol/L 4.0  Chloride 98 - 111 mmol/L 100  CO2 22 - 32 mmol/L 28  Calcium 8.9 - 10.3 mg/dL 9.4  Total Protein 6.5 - 8.1 g/dL 6.7  Total Bilirubin 0.3 - 1.2 mg/dL 0.7  Alkaline Phos 38 - 126 U/L 71  AST 15 - 41 U/L 28  ALT 0 - 44 U/L 25   CBC Latest Ref Rng & Units 04/30/2020  WBC 4.0 - 10.5 K/uL 6.7  Hemoglobin 13.0 - 17.0 g/dL 12.4(L)  Hematocrit 39 - 52 % 35.0(L)  Platelets 150 - 400 K/uL 171    No images are attached to the encounter.  DG Perc Gastrostomy Tube Insert W/Fluoro  Result Date: 04/18/2020 INDICATION: 64 year old with squamous cell carcinoma of the oropharynx. Recently placed gastrostomy tube on 04/11/2020 with persistent leaking. EXAM: GASTROSTOMY TUBE INJECTION WITH  FLUOROSCOPY MEDICATIONS: None  ANESTHESIA/SEDATION: None CONTRAST:  15 mL Omnipaque 300-administered into the gastric lumen. FLUOROSCOPY TIME:  Fluoroscopy Time: 2 minutes, 45 mGy COMPLICATIONS: None immediate. PROCEDURE: Patient was placed supine on the fluoroscopic table. Dilute contrast was injected through the balloon retention gastrostomy tube. Patient was rotated on the table and was placed in a semi upright view. Tube was flushed with saline at the end of the procedure. FINDINGS: Patient has an 24 French gastrostomy tube with retention balloon. The insertion site appears to be in the distal stomach near the antrum. Small amount of contrast draining into the proximal duodenum. Drainage into the duodenum appeared to improve with the patient in a right lateral decubitus position. No evidence for extravasation outside of the stomach while injecting under fluoroscopy. IMPRESSION: 1. Gastrostomy tube is positioned within the stomach. Insertion site is near the distal stomach as described. 2. Slightly delayed emptying from the stomach. If the patient continues to have problems with leakage, consider up sizing or conversion to [place] feeding tube. Electronically Signed   By: Markus Daft M.D.   On: 04/18/2020 16:27     Assessment and plan- Patient is a 64 y.o. 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  I will hold off on giving him cisplatin today.  I did reach out to radiation oncology to see if they would want to continue radiation treatment.  He completes all radiation treatment on 05/16/2020.  He reports that his oral intake as of now is good but understands that it could potentially worsen over the next few days with ongoing radiation.  He does have a PEG tube in place and may need to utilize it  for all his nutritional needs if his mucositis worsens.  Patient will continue to use Magic mouthwash and as needed morphine for his mouth pain.  I will see him back in 1 week's time with CBC with differential CMP and magnesium for possible cisplatin   Visit Diagnosis 1. Mucositis   2. Squamous cell carcinoma of oropharynx ([place])      Dr. Randa Evens, MD, MPH [facility] [phone] 04/30/2020 1:01 PM

## 2020-04-30 NOTE — Progress Notes (Signed)
Nutrition  RD was planning on seeing patient during infusion but infusion cancelled for today.   Ronnie Ingram B. Zenia Resides, [place], [place] Registered Dietitian [phone] (mobile)

## 2020-05-01 ENCOUNTER — Encounter (INDEPENDENT_AMBULATORY_CARE_PROVIDER_SITE_OTHER): Payer: Self-pay | Admitting: Vascular Surgery

## 2020-05-01 ENCOUNTER — Ambulatory Visit
Admission: RE | Admit: 2020-05-01 | Discharge: 2020-05-01 | Disposition: A | Discharge status: Home / Self Care | Payer: Medicare Other | Source: Ambulatory Visit | Attending: Radiation Oncology | Admitting: Radiation Oncology

## 2020-05-01 ENCOUNTER — Telehealth: Payer: Self-pay | Admitting: *Deleted

## 2020-05-01 DIAGNOSIS — Z510 Encounter for antineoplastic radiation therapy: Secondary | ICD-10-CM | POA: Diagnosis not present

## 2020-05-01 NOTE — Telephone Encounter (Signed)
[name] called today that the MMW was not at the pharmacy . I looked at the order and it did not go  Through.  I called and spoke to [facility] and gave verbal and they will do 360 bottle with lidocaine in it and he can have it 4 times a day as needed and swish and swallow. With 1 refill and covered by Barnabas Lister with [employer]

## 2020-05-02 ENCOUNTER — Ambulatory Visit
Admission: RE | Admit: 2020-05-02 | Discharge: 2020-05-02 | Disposition: A | Discharge status: Home / Self Care | Payer: Medicare Other | Source: Ambulatory Visit | Attending: Radiation Oncology | Admitting: Radiation Oncology

## 2020-05-02 ENCOUNTER — Telehealth: Payer: Self-pay | Admitting: *Deleted

## 2020-05-02 DIAGNOSIS — Z510 Encounter for antineoplastic radiation therapy: Secondary | ICD-10-CM | POA: Diagnosis not present

## 2020-05-02 NOTE — Telephone Encounter (Signed)
Message from radiation staff:Patient still complaining about his mouth sores, he states the mouthwash is not working. I guess the morphine is not helping either. . I sent a message to Janese Banks and she will see pt tomorrow and his appt 10:15. I have called him and spoke to him about the appt. and he will be here . I also spoke to [place] and she says the helios will help but the only place they find it [place] The pt. Does not have rx benefits and we are helping him with some of his meds. I will check with Barnabas Lister - navigator but I do not think he could pay for helios through [facility] but will try to help.

## 2020-05-03 ENCOUNTER — Ambulatory Visit
Admission: RE | Admit: 2020-05-03 | Discharge: 2020-05-03 | Disposition: A | Discharge status: Home / Self Care | Payer: Medicare Other | Source: Ambulatory Visit | Attending: Radiation Oncology | Admitting: Radiation Oncology

## 2020-05-03 ENCOUNTER — Inpatient Hospital Stay: Payer: Medicare Other

## 2020-05-03 ENCOUNTER — Other Ambulatory Visit: Payer: Self-pay | Admitting: *Deleted

## 2020-05-03 ENCOUNTER — Ambulatory Visit: Payer: Medicare Other

## 2020-05-03 ENCOUNTER — Ambulatory Visit: Payer: Medicare Other | Admitting: Oncology

## 2020-05-03 ENCOUNTER — Encounter: Payer: Self-pay | Admitting: Oncology

## 2020-05-03 ENCOUNTER — Inpatient Hospital Stay (HOSPITAL_BASED_OUTPATIENT_CLINIC_OR_DEPARTMENT_OTHER): Payer: Medicare Other | Admitting: Oncology

## 2020-05-03 VITALS — BP 93/55 | HR 64 | Temp 98.3°F | Resp 16 | Wt 146.2 lb

## 2020-05-03 DIAGNOSIS — K1233 Oral mucositis (ulcerative) due to radiation: Secondary | ICD-10-CM

## 2020-05-03 DIAGNOSIS — Z5111 Encounter for antineoplastic chemotherapy: Secondary | ICD-10-CM | POA: Diagnosis not present

## 2020-05-03 DIAGNOSIS — K1230 Oral mucositis (ulcerative), unspecified: Secondary | ICD-10-CM | POA: Diagnosis not present

## 2020-05-03 DIAGNOSIS — Z510 Encounter for antineoplastic radiation therapy: Secondary | ICD-10-CM | POA: Diagnosis not present

## 2020-05-03 MED ORDER — FENTANYL 12 MCG/HR TD PT72
1.0000 | MEDICATED_PATCH | TRANSDERMAL | 0 refills | Status: DC
Start: 2020-05-03 — End: 2020-05-21

## 2020-05-03 NOTE — Progress Notes (Signed)
Nutrition Follow-up:  Patient with tonsil cancer stage IV, HPV +. Patient receiving concurrent chemotherapy and radiation therapy.  PEG placed on 10/20.    Met with patient following MD appointment this am as patient already in clinic.  Patient reports that he has been giving 2 cartons of tube feeding via tube.  Reports mouth is sore but still trying to eat.  Reports bowels moved 2 times yesterday and 1 time day before.  Has been taking medications to help. Says that he does not have the protein liquid and has not been using.     Medications: reviewed  Labs: reviewed  Anthropometrics:   Weight 146 lb 3.2 oz today decreased from 148 lb on 11/4 156 lb on 10/25 162 lb on 9/27   Estimated Energy Needs  Kcals: 2100-2485 Protein: 105-124 g Fluid: > 2.1 L  NUTRITION DIAGNOSIS: Inadequate oral intake continues   INTERVENTION:  Recommend increasing osmolite 1.5 to 4 times per day (8am, noon, 4 pm and 8 pm).  Flush with 60 ml of water before and after.  Prosource 45 ml BID (with first two feedings of the day). Handwritten instructions given to patient today.  Also discussed with patient that if not eating anything or just few bites needs to increase to 1 1/2 cartons of osmolite 1.5, 4 times per day (total 6 cartons) to better meet nutritional needs. Discussed with patient that if not drinking water needs to place in feeding tube.   Spoke with [employer] representative regarding prosource.      MONITORING, EVALUATION, GOAL: weight trends, intake, tube feeding   NEXT VISIT: Monday, Nov 15 during infusion  Linna Thebeau B. Zenia Resides, [place], [place] Registered Dietitian [phone] (mobile)

## 2020-05-04 ENCOUNTER — Encounter: Payer: Self-pay | Admitting: Surgery

## 2020-05-04 ENCOUNTER — Ambulatory Visit
Admission: RE | Admit: 2020-05-04 | Discharge: 2020-05-04 | Disposition: A | Discharge status: Home / Self Care | Payer: Medicare Other | Source: Ambulatory Visit | Attending: Radiation Oncology | Admitting: Radiation Oncology

## 2020-05-04 ENCOUNTER — Ambulatory Visit: Payer: Medicare Other

## 2020-05-04 DIAGNOSIS — Z510 Encounter for antineoplastic radiation therapy: Secondary | ICD-10-CM | POA: Diagnosis not present

## 2020-05-05 ENCOUNTER — Ambulatory Visit: Payer: Medicare Other

## 2020-05-06 NOTE — Progress Notes (Signed)
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 CT soft tissue neck showed a hyperenhancement of left palatine tonsil measuring 2.8 cm. Enlarged left level 2A lymph node measuring 3.4 cm in long axis and a smaller but conspicuous level 2B lymph node measuring 9 mm. PET CT scan showed marked hypermetabolism in the left tonsillar region with an SUV of 8.8. SUV 4.2 at the level 2 lymph node. The smaller level 2B lymph node is superimposed on the dominant necrotic node. 4.8 x 4.4 cm abdominal aortic aneurysm. 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 Baruch Gouty and plan is for concurrent chemoradiation.  Treatment interrupted by worsening mucositis requiring hospitalization and feeding tube placement.  Interval history-patient reports that his mouth pain is getting worse although he is still trying to take oral and liquid food through his mouth.  He also has a PEG tube and uses it for about 1-2 cartons a day.  As needed oxycodone helps but the effect does not last for more than 1 or 2 hours  ECOG PS- 1 Pain scale- 4 Opioid associated constipation- no  Review of systems- Review of Systems  Constitutional: Positive for malaise/fatigue. Negative for chills, fever and weight loss.  HENT: Negative for congestion, ear discharge and nosebleeds.        Mouth pain  Eyes: Negative for blurred vision.  Respiratory:  Negative for cough, hemoptysis, sputum production, shortness of breath and wheezing.   Cardiovascular: Negative for chest pain, palpitations, orthopnea and claudication.  Gastrointestinal: Negative for abdominal pain, blood in stool, constipation, diarrhea, heartburn, melena, nausea and vomiting.  Genitourinary: Negative for dysuria, flank pain, frequency, hematuria and urgency.  Musculoskeletal: Negative for back pain, joint pain and myalgias.  Skin: Negative for rash.  Neurological: Negative for dizziness, tingling, focal weakness, seizures, weakness and headaches.  Endo/Heme/Allergies: Does not bruise/bleed easily.  Psychiatric/Behavioral: Negative for depression and suicidal ideas. The patient does not have insomnia.       No Known Allergies   Past Medical History:  Diagnosis Date   Arthritis    Asthma    Brain bleed ([place])    [place] years ago after falling off ladder   COPD (chronic obstructive pulmonary disease) ([place])    Diverticulitis    Hypertension    Tonsil cancer ([place])      Past Surgical History:  Procedure Laterality Date   APPENDECTOMY     FRACTURE SURGERY Left    ORIF left forearm   HERNIA REPAIR Left    inguinal   HERNIA REPAIR     abd   INGUINAL HERNIA REPAIR Right 09/06/2018   Procedure: HERNIA REPAIR INGUINAL ADULT, RIGHT;  Surgeon: Herbert Pun, MD;  Location: [facility];  Service: General;  Laterality: Right;   PORTA CATH INSERTION N/A 03/14/2020   Procedure: PORTA CATH INSERTION;  Surgeon: Algernon Huxley, MD;  Location: [facility];  Service: Cardiovascular;  Laterality: N/A;    Social History   Socioeconomic History  Marital status: Significant Other    Spouse name: Not on file   Number of children: Not on file   Years of education: Not on file   Highest education level: Not on file  Occupational History   Not on file  Tobacco Use   Smoking status: Current Every Day Smoker    Packs/day: 0.50    Years: 30.00      Pack years: 15.00    Types: Cigarettes   Smokeless tobacco: Never Used  Vaping Use   Vaping Use: Never used  Substance and Sexual Activity   Alcohol use: Not Currently    Alcohol/week: 0.0 standard drinks    Comment: not drank anything from 3 weeks from 04/30/2020   Drug use: Yes    Types: Marijuana   Sexual activity: Not Currently  Other Topics Concern   Not on file  Social History Narrative   Not on file   Social Determinants of Health   Financial Resource Strain:    Difficulty of Paying Living Expenses: Not on file  Food Insecurity:    Worried About Charity fundraiser in the Last Year: Not on file   [employer] of Food in the Last Year: Not on file  Transportation Needs:    Lack of Transportation (Medical): Not on file   Lack of Transportation (Non-Medical): Not on file  Physical Activity:    Days of Exercise per Week: Not on file   Minutes of Exercise per Session: Not on file  Stress:    Feeling of Stress : Not on file  Social Connections:    Frequency of Communication with Friends and Family: Not on file   Frequency of Social Gatherings with Friends and Family: Not on file   Attends Religious Services: Not on file   Active Member of Clubs or Organizations: Not on file   Attends Archivist Meetings: Not on file   Marital Status: Not on file  Intimate Partner Violence:    Fear of Current or Ex-Partner: Not on file   Emotionally Abused: Not on file   Physically Abused: Not on file   Sexually Abused: Not on file    No family history on file.   Current Outpatient Medications:    gabapentin (NEURONTIN) 300 MG capsule, Take 300 mg by mouth 2 (two) times daily as needed (pain). , Disp: , Rfl:    lidocaine-prilocaine (EMLA) cream, Apply to affected area once, Disp: 30 g, Rfl: 3   lisinopril (PRINIVIL,ZESTRIL) 40 MG tablet, Take 40 mg by mouth daily. , Disp: , Rfl:    magic mouthwash w/lidocaine SOLN, Take 5 mLs by mouth 4 (four)  times daily as needed for mouth pain., Disp: 340 mL, Rfl: 1   metoCLOPramide (REGLAN) 5 MG tablet, Take 1 tablet (5 mg total) by mouth every 8 (eight) hours as needed for nausea., Disp: 30 tablet, Rfl: 0   montelukast (SINGULAIR) 10 MG tablet, Take 10 mg by mouth daily., Disp: , Rfl:    morphine (ROXANOL) 20 MG/ML concentrated solution, Take 0.5 mLs (10 mg total) by mouth every 4 (four) hours as needed for severe pain. Please pay for on byrd fund per [employer], Disp: 30 mL, Rfl: 0   Nutritional Supplements (FEEDING SUPPLEMENT, PROSOURCE TF,) liquid, Place 45 mLs into feeding tube 2 (two) times daily., Disp: 2700 mL, Rfl: 1   omeprazole (PRILOSEC) 40 MG capsule, Take 1 capsule (40 mg total) by mouth daily., Disp: 30 capsule, Rfl: 0   pantoprazole (PROTONIX)  40 MG tablet, Take 1 tablet (40 mg total) by mouth daily., Disp: 30 tablet, Rfl: 0   polyethylene glycol powder (MIRALAX) 17 GM/SCOOP powder, Take 255 g by mouth in the morning and at bedtime for 60 doses., Disp: 15300 g, Rfl: 1   Sennosides 8.8 MG/ML LIQD, Take 10 mLs (88 mg total) by mouth daily., Disp: 300 mL, Rfl: 1   tiotropium (SPIRIVA) 18 MCG inhalation capsule, Place 18 mcg into inhaler and inhale daily. , Disp: , Rfl:    Water For Irrigation, Sterile (FREE WATER) SOLN, Place 120 mLs into feeding tube 6 (six) times daily., Disp: 1000 mL, Rfl: 1   Clotrimazole 1 % OINT, Apply a thin layer to the affected area twice daily as needed. (Patient not taking: Reported on 04/30/2020), Disp: 30 g, Rfl: 0   fentaNYL (DURAGESIC) 12 MCG/HR, Place 1 patch onto the skin every 3 (three) days., Disp: 10 patch, Rfl: 0   Nutritional Supplements (FEEDING SUPPLEMENT, OSMOLITE 1.5 CAL,) LIQD, Place 120 mLs into feeding tube 6 (six) times daily. (Patient not taking: Reported on 04/30/2020), Disp: 237 mL, Rfl: 3 No current facility-administered medications for this visit.  Facility-Administered Medications Ordered in Other Visits:    sodium chloride  flush (NS) 0.9 % injection 10 mL, 10 mL, Intravenous, PRN, Lloyd Huger, MD, 10 mL at 04/23/20 0850  Physical exam:  Vitals:   05/03/20 1023  BP: (!) 93/55  Pulse: 64  Resp: 16  Temp: 98.3 F (36.8 C)  TempSrc: Tympanic  SpO2: 98%  Weight: 146 lb 3.2 oz (66.3 kg)   Physical Exam HENT:     Head: Normocephalic and atraumatic.     Mouth/Throat:     Comments: Grade 2 mucositis noted with confluent areas of erythema without ulceration Eyes:     Pupils: Pupils are equal, round, and reactive to light.  Cardiovascular:     Rate and Rhythm: Normal rate and regular rhythm.     Heart sounds: Normal heart sounds.  Pulmonary:     Effort: Pulmonary effort is normal.     Breath sounds: Normal breath sounds.  Abdominal:     General: Bowel sounds are normal.     Palpations: Abdomen is soft.  Musculoskeletal:     Cervical back: Normal range of motion.  Skin:    General: Skin is warm and dry.  Neurological:     Mental Status: He is alert and oriented to person, place, and time.      CMP Latest Ref Rng & Units 04/30/2020  Glucose 70 - 99 mg/dL 110(H)  BUN 8 - 23 mg/dL 18  Creatinine 0.61 - 1.24 mg/dL 0.66  Sodium 135 - 145 mmol/L 136  Potassium 3.5 - 5.1 mmol/L 4.0  Chloride 98 - 111 mmol/L 100  CO2 22 - 32 mmol/L 28  Calcium 8.9 - 10.3 mg/dL 9.4  Total Protein 6.5 - 8.1 g/dL 6.7  Total Bilirubin 0.3 - 1.2 mg/dL 0.7  Alkaline Phos 38 - 126 U/L 71  AST 15 - 41 U/L 28  ALT 0 - 44 U/L 25   CBC Latest Ref Rng & Units 04/30/2020  WBC 4.0 - 10.5 K/uL 6.7  Hemoglobin 13.0 - 17.0 g/dL 12.4(L)  Hematocrit 39 - 52 % 35.0(L)  Platelets 150 - 400 K/uL 171    No images are attached to the encounter.  DG Perc Gastrostomy Tube Insert W/Fluoro  Result Date: 04/18/2020 INDICATION: 64 year old with squamous cell carcinoma of the oropharynx. Recently placed gastrostomy tube on 04/11/2020  with persistent leaking. EXAM: GASTROSTOMY TUBE INJECTION WITH FLUOROSCOPY MEDICATIONS: None  ANESTHESIA/SEDATION: None CONTRAST:  15 mL Omnipaque 300-administered into the gastric lumen. FLUOROSCOPY TIME:  Fluoroscopy Time: 2 minutes, 45 mGy COMPLICATIONS: None immediate. PROCEDURE: Patient was placed supine on the fluoroscopic table. Dilute contrast was injected through the balloon retention gastrostomy tube. Patient was rotated on the table and was placed in a semi upright view. Tube was flushed with saline at the end of the procedure. FINDINGS: Patient has an 66 French gastrostomy tube with retention balloon. The insertion site appears to be in the distal stomach near the antrum. Small amount of contrast draining into the proximal duodenum. Drainage into the duodenum appeared to improve with the patient in a right lateral decubitus position. No evidence for extravasation outside of the stomach while injecting under fluoroscopy. IMPRESSION: 1. Gastrostomy tube is positioned within the stomach. Insertion site is near the distal stomach as described. 2. Slightly delayed emptying from the stomach. If the patient continues to have problems with leakage, consider up sizing or conversion to [place] feeding tube. Electronically Signed   By: Markus Daft M.D.   On: 04/18/2020 16:27     Assessment and plan- Patient is a 64 y.o. 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 He is here for acute visit for ongoing mucositis  Patient did not receive his weekly cisplatin on 04/30/2020 due to his mucositis.  He is however getting radiation treatment.  Mucositis pain is getting worse .  As needed morphine 10 mg every 4 hours as needed is not helping.  I will plan to add fentanyl patch 12 mcg at this time.  He will also be meeting Cyndi Bender to discuss increasing his PEG tube intake.  Patient also has Magic mouthwash  I will see him next week to decide if he can receive his last cycle of chemotherapy and assess ongoing pain   Visit Diagnosis 1. Mucositis     2. Mucositis due to radiation therapy      Dr. Randa Evens, MD, MPH [facility] at [facility] [phone] 05/06/2020 10:44 AM

## 2020-05-07 ENCOUNTER — Other Ambulatory Visit: Payer: Self-pay

## 2020-05-07 ENCOUNTER — Encounter: Payer: Self-pay | Admitting: Oncology

## 2020-05-07 ENCOUNTER — Inpatient Hospital Stay (HOSPITAL_BASED_OUTPATIENT_CLINIC_OR_DEPARTMENT_OTHER): Payer: Medicare Other | Admitting: Oncology

## 2020-05-07 ENCOUNTER — Ambulatory Visit: Payer: Self-pay | Admitting: Surgery

## 2020-05-07 ENCOUNTER — Inpatient Hospital Stay: Payer: Medicare Other

## 2020-05-07 ENCOUNTER — Ambulatory Visit
Admission: RE | Admit: 2020-05-07 | Discharge: 2020-05-07 | Disposition: A | Discharge status: Home / Self Care | Payer: Medicare Other | Source: Ambulatory Visit | Attending: Radiation Oncology | Admitting: Radiation Oncology

## 2020-05-07 ENCOUNTER — Other Ambulatory Visit: Payer: Self-pay | Admitting: *Deleted

## 2020-05-07 VITALS — BP 114/70 | HR 61 | Temp 97.8°F | Resp 16 | Wt 146.1 lb

## 2020-05-07 DIAGNOSIS — K1233 Oral mucositis (ulcerative) due to radiation: Secondary | ICD-10-CM | POA: Diagnosis not present

## 2020-05-07 DIAGNOSIS — Z510 Encounter for antineoplastic radiation therapy: Secondary | ICD-10-CM | POA: Diagnosis not present

## 2020-05-07 DIAGNOSIS — C109 Malignant neoplasm of oropharynx, unspecified: Secondary | ICD-10-CM

## 2020-05-07 DIAGNOSIS — Z5111 Encounter for antineoplastic chemotherapy: Secondary | ICD-10-CM

## 2020-05-07 LAB — MAGNESIUM: Magnesium: 2.1 mg/dL (ref 1.7–2.4)

## 2020-05-07 LAB — CBC WITH DIFFERENTIAL/PLATELET
Abs Immature Granulocytes: 0.01 10*3/uL (ref 0.00–0.07)
Basophils Absolute: 0 10*3/uL (ref 0.0–0.1)
Basophils Relative: 1 %
Eosinophils Absolute: 0.2 10*3/uL (ref 0.0–0.5)
Eosinophils Relative: 4 %
HCT: 32.2 % — ABNORMAL LOW (ref 39.0–52.0)
Hemoglobin: 11.4 g/dL — ABNORMAL LOW (ref 13.0–17.0)
Immature Granulocytes: 0 %
Lymphocytes Relative: 15 %
Lymphs Abs: 0.6 10*3/uL — ABNORMAL LOW (ref 0.7–4.0)
MCH: 35.1 pg — ABNORMAL HIGH (ref 26.0–34.0)
MCHC: 35.4 g/dL (ref 30.0–36.0)
MCV: 99.1 fL (ref 80.0–100.0)
Monocytes Absolute: 0.5 10*3/uL (ref 0.1–1.0)
Monocytes Relative: 13 %
Neutro Abs: 2.7 10*3/uL (ref 1.7–7.7)
Neutrophils Relative %: 67 %
Platelets: 127 10*3/uL — ABNORMAL LOW (ref 150–400)
RBC: 3.25 MIL/uL — ABNORMAL LOW (ref 4.22–5.81)
RDW: 13.7 % (ref 11.5–15.5)
WBC: 4.1 10*3/uL (ref 4.0–10.5)
nRBC: 0 % (ref 0.0–0.2)

## 2020-05-07 LAB — COMPREHENSIVE METABOLIC PANEL
ALT: 16 U/L (ref 0–44)
AST: 20 U/L (ref 15–41)
Albumin: 3.5 g/dL (ref 3.5–5.0)
Alkaline Phosphatase: 68 U/L (ref 38–126)
Anion gap: 7 (ref 5–15)
BUN: 26 mg/dL — ABNORMAL HIGH (ref 8–23)
CO2: 29 mmol/L (ref 22–32)
Calcium: 8.8 mg/dL — ABNORMAL LOW (ref 8.9–10.3)
Chloride: 96 mmol/L — ABNORMAL LOW (ref 98–111)
Creatinine, Ser: 0.6 mg/dL — ABNORMAL LOW (ref 0.61–1.24)
GFR, Estimated: >60 mL/min (ref >60)
Glucose, Bld: 137 mg/dL — ABNORMAL HIGH (ref 70–99)
Potassium: 4.1 mmol/L (ref 3.5–5.1)
Sodium: 132 mmol/L — ABNORMAL LOW (ref 135–145)
Total Bilirubin: 0.5 mg/dL (ref 0.3–1.2)
Total Protein: 6.8 g/dL (ref 6.5–8.1)

## 2020-05-07 LAB — PHOSPHORUS: Phosphorus: 4.1 mg/dL (ref 2.5–4.6)

## 2020-05-07 MED ORDER — PALONOSETRON HCL INJECTION 0.25 MG/5ML
0.2500 mg | Freq: Once | INTRAVENOUS | Status: AC
  Administered 2020-05-07: 0.25 mg via INTRAVENOUS
  Filled 2020-05-07: qty 5

## 2020-05-07 MED ORDER — HEPARIN SOD (PORK) LOCK FLUSH 100 UNIT/ML IV SOLN
INTRAVENOUS | Status: AC
  Filled 2020-05-07: qty 5

## 2020-05-07 MED ORDER — MORPHINE SULFATE (CONCENTRATE) 20 MG/ML PO SOLN: 10.0000 mg | ORAL | 0 refills | Status: DC | PRN

## 2020-05-07 MED ORDER — SODIUM CHLORIDE 0.9 % IV SOLN
10.0000 mg | Freq: Once | INTRAVENOUS | Status: AC
  Administered 2020-05-07: 10 mg via INTRAVENOUS
  Filled 2020-05-07: qty 10

## 2020-05-07 MED ORDER — SODIUM CHLORIDE 0.9 % IV SOLN
150.0000 mg | Freq: Once | INTRAVENOUS | Status: AC
  Administered 2020-05-07: 150 mg via INTRAVENOUS
  Filled 2020-05-07: qty 5

## 2020-05-07 MED ORDER — HEPARIN SOD (PORK) LOCK FLUSH 100 UNIT/ML IV SOLN
500.0000 [IU] | Freq: Once | INTRAVENOUS | Status: AC
  Administered 2020-05-07: 500 [IU] via INTRAVENOUS
  Filled 2020-05-07: qty 5

## 2020-05-07 MED ORDER — SODIUM CHLORIDE 0.9% FLUSH
10.0000 mL | INTRAVENOUS | Status: DC | PRN
  Administered 2020-05-07: 10 mL via INTRAVENOUS
  Filled 2020-05-07: qty 10

## 2020-05-07 MED ORDER — HEPARIN SOD (PORK) LOCK FLUSH 100 UNIT/ML IV SOLN
500.0000 [IU] | Freq: Once | INTRAVENOUS | Status: DC | PRN
  Filled 2020-05-07: qty 5

## 2020-05-07 MED ORDER — SODIUM CHLORIDE 0.9 % IV SOLN
Freq: Once | INTRAVENOUS | Status: AC
  Filled 2020-05-07: qty 250

## 2020-05-07 MED ORDER — SODIUM CHLORIDE 0.9 % IV SOLN
Freq: Once | INTRAVENOUS | Status: AC
  Filled 2020-05-07: qty 1000

## 2020-05-07 MED ORDER — SODIUM CHLORIDE 0.9 % IV SOLN
40.0000 mg/m2 | Freq: Once | INTRAVENOUS | Status: AC
  Administered 2020-05-07: 75 mg via INTRAVENOUS
  Filled 2020-05-07: qty 75

## 2020-05-07 NOTE — Progress Notes (Signed)
Nutrition Follow-up:   Patient with tonsil cancer stage IV, HPV +.  Patient receiving concurrent chemotherapy and radiation therapy.  PEG placed on 10/20.   Met with patient during infusion.  Woke patient up to speak with him.  Patient reports that he is giving 4 cartons of osmolite 1.5 and protein via tube.  Also repots that he is trying to eat as well orally.  Has been able to eat some french fries, cornbeef hash, milkshake.  Reports that he is drinking water as well, soothes throat.      Medications: reviewed  Labs: reviewed  Anthropometrics:   Weight 146 lb 1.6 oz today stable  146 lb 3.2 oz on 11/11 148 lb on 11/4 156 lb on 10/25 162 lb on 9/27   Estimated Energy Needs  Kcals: 2100-2485 Protein: 105-124 g Fluid: > 2.1 L  NUTRITION DIAGNOSIS: Inadequate oral intake continues    INTERVENTION:  Continue osmolite 1.5, 4-6 times per day.  Flush with 15ml of water before and after each feeding.  Prosource 110ml BID.      MONITORING, EVALUATION, GOAL: weight trends, intake, tube feeding   NEXT VISIT: Nov 22 after radiation  Velena Keegan B. Zenia Resides, [place], [place] Registered Dietitian [phone] (mobile)

## 2020-05-07 NOTE — Progress Notes (Signed)
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 CT soft tissue neck showed a hyperenhancement of left palatine tonsil measuring 2.8 cm. Enlarged left level 2A lymph node measuring 3.4 cm in long axis and a smaller but conspicuous level 2B lymph node measuring 9 mm. PET CT scan showed marked hypermetabolism in the left tonsillar region with an SUV of 8.8. SUV 4.2 at the level 2 lymph node. The smaller level 2B lymph node is superimposed on the dominant necrotic node. 4.8 x 4.4 cm abdominal aortic aneurysm. 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 Baruch Gouty and plan is for concurrent chemoradiation.  Treatment interrupted by worsening mucositis requiring hospitalization and feeding tube placement.   Interval history-he still has pain in his throat but reports that the fentanyl patch has been helping.  He has been using 10 mg of morphine but only uses it once a day.  He has been using 4 cartons of PEG tube feeds a day.  Weight has remained stable over the last 1 week.  He is moving his bowels regularly.  ECOG PS- 1 Pain scale- 5 Opioid associated constipation- no  Review of systems- Review of Systems  Constitutional: Positive for malaise/fatigue. Negative for chills, fever and weight loss.  HENT: Negative for congestion, ear discharge and nosebleeds.         Throat pain  Eyes: Negative for blurred vision.  Respiratory: Negative for cough, hemoptysis, sputum production, shortness of breath and wheezing.   Cardiovascular: Negative for chest pain, palpitations, orthopnea and claudication.  Gastrointestinal: Negative for abdominal pain, blood in stool, constipation, diarrhea, heartburn, melena, nausea and vomiting.  Genitourinary: Negative for dysuria, flank pain, frequency, hematuria and urgency.  Musculoskeletal: Negative for back pain, joint pain and myalgias.  Skin: Negative for rash.  Neurological: Negative for dizziness, tingling, focal weakness, seizures, weakness and headaches.  Endo/Heme/Allergies: Does not bruise/bleed easily.  Psychiatric/Behavioral: Negative for depression and suicidal ideas. The patient does not have insomnia.       No Known Allergies   Past Medical History:  Diagnosis Date  . Arthritis   . Asthma   . Brain bleed ([place])    [place] years ago after falling off ladder  . COPD (chronic obstructive pulmonary disease) ([place])   . Diverticulitis   . Hypertension   . Tonsil cancer [facility])      Past Surgical History:  Procedure Laterality Date  . APPENDECTOMY    . FRACTURE SURGERY Left    ORIF left forearm  . HERNIA REPAIR Left    inguinal  . HERNIA REPAIR     abd  . INGUINAL HERNIA REPAIR Right 09/06/2018   Procedure: HERNIA REPAIR INGUINAL ADULT, RIGHT;  Surgeon: Herbert Pun, MD;  Location: [facility] ORS;  Service: General;  Laterality: Right;  . PORTA CATH INSERTION N/A 03/14/2020   Procedure: PORTA CATH INSERTION;  Surgeon: Algernon Huxley, MD;  Location: [place] [facility];  Service: Cardiovascular;  Laterality: N/A;    Social History   Socioeconomic History  . Marital status: Significant Other    Spouse name: Not on file  . Number of children: Not on file  . Years of education: Not on file  . Highest education level: Not on file  Occupational History  . Not on file  Tobacco Use  . Smoking  status: Current Every Day Smoker    Packs/day: 0.50    Years: 30.00    Pack years: 15.00    Types: Cigarettes  . Smokeless tobacco: Never Used  Vaping Use  . Vaping Use: Never used  Substance and Sexual Activity  . Alcohol use: Not Currently    Alcohol/week: 0.0 standard drinks    Comment: not drank anything from 3 weeks from 04/30/2020  . Drug use: Yes    Types: Marijuana  . Sexual activity: Not Currently  Other Topics Concern  . Not on file  Social History Narrative  . Not on file   Social Determinants of Health   Financial Resource Strain:   . Difficulty of Paying Living Expenses: Not on file  Food Insecurity:   . Worried About Charity fundraiser in the Last Year: Not on file  . Ran Out of Food in the Last Year: Not on file  Transportation Needs:   . Lack of Transportation (Medical): Not on file  . Lack of Transportation (Non-Medical): Not on file  Physical Activity:   . Days of Exercise per Week: Not on file  . Minutes of Exercise per Session: Not on file  Stress:   . Feeling of Stress : Not on file  Social Connections:   . Frequency of Communication with Friends and Family: Not on file  . Frequency of Social Gatherings with Friends and Family: Not on file  . Attends Religious Services: Not on file  . Active Member of Clubs or Organizations: Not on file  . Attends Archivist Meetings: Not on file  . Marital Status: Not on file  Intimate Partner Violence:   . Fear of Current or Ex-Partner: Not on file  . Emotionally Abused: Not on file  . Physically Abused: Not on file  . Sexually Abused: Not on file    No family history on file.   Current Outpatient Medications:  .  Clotrimazole 1 % OINT, Apply a thin layer to the affected area twice daily as needed. (Patient not taking: Reported on 04/30/2020), Disp: 30 g, Rfl: 0 .  fentaNYL (DURAGESIC) 12 MCG/HR, Place 1 patch onto the skin every 3 (three) days., Disp: 10 patch, Rfl: 0 .  gabapentin (NEURONTIN)  300 MG capsule, Take 300 mg by mouth 2 (two) times daily as needed (pain). , Disp: , Rfl:  .  lidocaine-prilocaine (EMLA) cream, Apply to affected area once, Disp: 30 g, Rfl: 3 .  lisinopril (PRINIVIL,ZESTRIL) 40 MG tablet, Take 40 mg by mouth daily. , Disp: , Rfl:  .  magic mouthwash w/lidocaine SOLN, Take 5 mLs by mouth 4 (four) times daily as needed for mouth pain., Disp: 340 mL, Rfl: 1 .  metoCLOPramide (REGLAN) 5 MG tablet, Take 1 tablet (5 mg total) by mouth every 8 (eight) hours as needed for nausea., Disp: 30 tablet, Rfl: 0 .  montelukast (SINGULAIR) 10 MG tablet, Take 10 mg by mouth daily., Disp: , Rfl:  .  morphine (ROXANOL) 20 MG/ML concentrated solution, Take 0.5 mLs (10 mg total) by mouth every 4 (four) hours as needed for severe pain. Please  pay for on byrd fund per [employer], Disp: 30 mL, Rfl: 0 .  Nutritional Supplements (FEEDING SUPPLEMENT, OSMOLITE 1.5 CAL,) LIQD, Place 120 mLs into feeding tube 6 (six) times daily. (Patient not taking: Reported on 04/30/2020), Disp: 237 mL, Rfl: 3 .  Nutritional Supplements (FEEDING SUPPLEMENT, PROSOURCE TF,) liquid, Place 45 mLs into feeding tube 2 (two) times daily., Disp: 2700 mL, Rfl: 1 .  omeprazole (PRILOSEC) 40 MG capsule, Take 1 capsule (40 mg total) by mouth daily., Disp: 30 capsule, Rfl: 0 .  pantoprazole (PROTONIX) 40 MG tablet, Take 1 tablet (40 mg total) by mouth daily., Disp: 30 tablet, Rfl: 0 .  polyethylene glycol powder (MIRALAX) 17 GM/SCOOP powder, Take 255 g by mouth in the morning and at bedtime for 60 doses., Disp: 15300 g, Rfl: 1 .  Sennosides 8.8 MG/ML LIQD, Take 10 mLs (88 mg total) by mouth daily., Disp: 300 mL, Rfl: 1 .  tiotropium (SPIRIVA) 18 MCG inhalation capsule, Place 18 mcg into inhaler and inhale daily. , Disp: , Rfl:  .  Water For Irrigation, Sterile (FREE WATER) SOLN, Place 120 mLs into feeding tube 6 (six) times daily., Disp: 1000 mL, Rfl: 1 No current facility-administered medications for this  visit.  Facility-Administered Medications Ordered in Other Visits:  .  heparin lock flush 100 unit/mL, 500 Units, Intravenous, Once, Randa Evens C, MD .  sodium chloride flush (NS) 0.9 % injection 10 mL, 10 mL, Intravenous, PRN, Lloyd Huger, MD, 10 mL at 04/23/20 0850 .  sodium chloride flush (NS) 0.9 % injection 10 mL, 10 mL, Intravenous, PRN, Sindy Guadeloupe, MD  Physical exam:  Vitals:   05/07/20 0851  BP: 114/70  Pulse: 61  Resp: 16  Temp: 97.8 F (36.6 C)  TempSrc: Tympanic  SpO2: 96%  Weight: 146 lb 1.6 oz (66.3 kg)   Physical Exam HENT:     Head: Normocephalic and atraumatic.     Mouth/Throat:     Comments: Mucositis overall appears stable to mildly improved Cardiovascular:     Rate and Rhythm: Normal rate and regular rhythm.     Heart sounds: Normal heart sounds.  Pulmonary:     Effort: Pulmonary effort is normal.     Breath sounds: Normal breath sounds.  Abdominal:     General: Bowel sounds are normal.     Palpations: Abdomen is soft.     Comments: PEG tube in place  Lymphadenopathy:     Comments: Left-sided level 2 cervical lymph node just about palpable  Skin:    General: Skin is warm and dry.  Neurological:     Mental Status: He is alert and oriented to person, place, and time.      CMP Latest Ref Rng & Units 04/30/2020  Glucose 70 - 99 mg/dL 110(H)  BUN 8 - 23 mg/dL 18  Creatinine 0.61 - 1.24 mg/dL 0.66  Sodium 135 - 145 mmol/L 136  Potassium 3.5 - 5.1 mmol/L 4.0  Chloride 98 - 111 mmol/L 100  CO2 22 - 32 mmol/L 28  Calcium 8.9 - 10.3 mg/dL 9.4  Total Protein 6.5 - 8.1 g/dL 6.7  Total Bilirubin 0.3 - 1.2 mg/dL 0.7  Alkaline Phos 38 - 126 U/L 71  AST 15 - 41 U/L 28  ALT 0 - 44 U/L 25   CBC Latest Ref Rng & Units 04/30/2020  WBC 4.0 - 10.5 K/uL 6.7  Hemoglobin 13.0 - 17.0 g/dL 12.4(L)  Hematocrit 39 - 52 % 35.0(L)  Platelets 150 -  400 K/uL 171    No images are attached to the encounter.  DG Perc Gastrostomy Tube Insert  W/Fluoro  Result Date: 04/18/2020 INDICATION: 64 year old with squamous cell carcinoma of the oropharynx. Recently placed gastrostomy tube on 04/11/2020 with persistent leaking. EXAM: GASTROSTOMY TUBE INJECTION WITH FLUOROSCOPY MEDICATIONS: None ANESTHESIA/SEDATION: None CONTRAST:  15 mL Omnipaque 300-administered into the gastric lumen. FLUOROSCOPY TIME:  Fluoroscopy Time: 2 minutes, 45 mGy COMPLICATIONS: None immediate. PROCEDURE: Patient was placed supine on the fluoroscopic table. Dilute contrast was injected through the balloon retention gastrostomy tube. Patient was rotated on the table and was placed in a semi upright view. Tube was flushed with saline at the end of the procedure. FINDINGS: Patient has an 29 French gastrostomy tube with retention balloon. The insertion site appears to be in the distal stomach near the antrum. Small amount of contrast draining into the proximal duodenum. Drainage into the duodenum appeared to improve with the patient in a right lateral decubitus position. No evidence for extravasation outside of the stomach while injecting under fluoroscopy. IMPRESSION: 1. Gastrostomy tube is positioned within the stomach. Insertion site is near the distal stomach as described. 2. Slightly delayed emptying from the stomach. If the patient continues to have problems with leakage, consider up sizing or conversion to [place] feeding tube. Electronically Signed   By: Markus Daft M.D.   On: 04/18/2020 16:27     Assessment and plan- Patient is a 64 y.o. 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 He is here for on treatment assessment prior to cycle 6 of weekly cisplatin chemotherapy and follow-up of ongoing mucositis  Counts okay to proceed with cycle 6 of weekly cisplatin chemotherapy today.  This would be his last chemo.  He is completing radiation treatment in 1 week.  Patient reports that he is drinking plenty of oral fluids and  he has a PEG tube in place for nutrition.  Mucositis and pain associated with it: Continue fentanyl patch 12 mcg every 3 days.  He also has Magic mouthwash and as needed morphine.  I have encouraged him to use morphine every 3-4 hours as needed as he is only using it once a day.  I will see him back in 2 weeks with labs for possible IV fluids.  Plan to get PET scan 3 months after chemoradiation.    Visit Diagnosis 1. Encounter for antineoplastic chemotherapy   2. Mucositis due to radiation therapy   3. Squamous cell carcinoma of oropharynx (HCC)      Dr. Randa Evens, MD, MPH [facility] [phone] 05/07/2020 8:45 AM

## 2020-05-08 ENCOUNTER — Ambulatory Visit
Admission: RE | Admit: 2020-05-08 | Discharge: 2020-05-08 | Disposition: A | Discharge status: Home / Self Care | Payer: Medicare Other | Source: Ambulatory Visit | Attending: Radiation Oncology | Admitting: Radiation Oncology

## 2020-05-08 DIAGNOSIS — Z510 Encounter for antineoplastic radiation therapy: Secondary | ICD-10-CM | POA: Diagnosis not present

## 2020-05-09 ENCOUNTER — Ambulatory Visit
Admission: RE | Admit: 2020-05-09 | Discharge: 2020-05-09 | Disposition: A | Discharge status: Home / Self Care | Payer: Medicare Other | Source: Ambulatory Visit | Attending: Radiation Oncology | Admitting: Radiation Oncology

## 2020-05-09 ENCOUNTER — Ambulatory Visit: Payer: Medicare Other

## 2020-05-09 DIAGNOSIS — Z510 Encounter for antineoplastic radiation therapy: Secondary | ICD-10-CM | POA: Diagnosis not present

## 2020-05-10 ENCOUNTER — Ambulatory Visit: Payer: Medicare Other

## 2020-05-11 ENCOUNTER — Ambulatory Visit: Payer: Medicare Other

## 2020-05-12 ENCOUNTER — Ambulatory Visit: Payer: Medicare Other

## 2020-05-14 ENCOUNTER — Inpatient Hospital Stay: Payer: Medicare Other

## 2020-05-14 ENCOUNTER — Ambulatory Visit
Admission: RE | Admit: 2020-05-14 | Discharge: 2020-05-14 | Disposition: A | Discharge status: Home / Self Care | Payer: Medicare Other | Source: Ambulatory Visit | Attending: Radiation Oncology | Admitting: Radiation Oncology

## 2020-05-14 DIAGNOSIS — Z510 Encounter for antineoplastic radiation therapy: Secondary | ICD-10-CM | POA: Diagnosis not present

## 2020-05-14 NOTE — Progress Notes (Signed)
Nutrition Follow-up:  Patient with tonsil cancer stage IV HPV +.  PEG placed on 10/20.   Spoke with patient after radiation.  Patient reports that he has been giving 4 cartons of tube feeding (Giving 2 cartons then 4 hours later 2 more cartons). Flushing with syringe water before and after each feeding.  Reports that he is eating a little bit by mouth but not much as hurts/burns. Reports water helps burning sensation. Has been drinking a good amount of fluids.    Medications: reviewed  Labs: reviewed  Anthropometrics:   Weight 144 lb today on radiation scales decreased from 146 lb on 11/15  146 lb on 11/11 148 lb on 11/4 156 lb on 10/25 162 lb on 9/27   Estimated Energy Needs  Kcals: 2100-2485 Protein: 105-124 g Fluid: > 2.1 L  NUTRITION DIAGNOSIS: Inadequate oral intake continues   INTERVENTION:  Recommend to increase osmolite 1.5 to 5 cartons per day (2 cartons then 2 cartons then 1 carton).  Flush with 34ml water before and after each feeding. Continue protein 64ml BID Drink fluids orally to keep hydrated.  Ideally, needs to drink at least 3 cups water.  Patient says he is drinking 1 quart a day.     MONITORING, EVALUATION, GOAL: weight trends, intake, tube feeding   NEXT VISIT: Monday, Nov 29 during fluids  Denny Mccree B. Zenia Resides, [place], [place] Registered Dietitian [phone] (mobile)

## 2020-05-15 ENCOUNTER — Ambulatory Visit
Admission: RE | Admit: 2020-05-15 | Discharge: 2020-05-15 | Disposition: A | Discharge status: Home / Self Care | Payer: Medicare Other | Source: Ambulatory Visit | Attending: Radiation Oncology | Admitting: Radiation Oncology

## 2020-05-15 DIAGNOSIS — Z510 Encounter for antineoplastic radiation therapy: Secondary | ICD-10-CM | POA: Diagnosis not present

## 2020-05-16 ENCOUNTER — Ambulatory Visit
Admission: RE | Admit: 2020-05-16 | Discharge: 2020-05-16 | Disposition: A | Discharge status: Home / Self Care | Payer: Medicare Other | Source: Ambulatory Visit | Attending: Radiation Oncology | Admitting: Radiation Oncology

## 2020-05-16 ENCOUNTER — Other Ambulatory Visit: Payer: Self-pay | Admitting: *Deleted

## 2020-05-16 ENCOUNTER — Ambulatory Visit: Payer: Medicare Other

## 2020-05-16 ENCOUNTER — Other Ambulatory Visit: Payer: Self-pay | Admitting: Hospice and Palliative Medicine

## 2020-05-16 DIAGNOSIS — Z510 Encounter for antineoplastic radiation therapy: Secondary | ICD-10-CM | POA: Diagnosis not present

## 2020-05-16 MED ORDER — MAGIC MOUTHWASH W/LIDOCAINE: 5.0000 mL | Freq: Four times a day (QID) | ORAL | 1 refills | Status: DC | PRN

## 2020-05-16 MED ORDER — MORPHINE SULFATE (CONCENTRATE) 20 MG/ML PO SOLN: 20.0000 mg | ORAL | 0 refills | Status: DC | PRN

## 2020-05-16 NOTE — Progress Notes (Signed)
Error

## 2020-05-21 ENCOUNTER — Ambulatory Visit
Admission: RE | Admit: 2020-05-21 | Discharge: 2020-05-21 | Disposition: A | Discharge status: Home / Self Care | Payer: Medicare Other | Source: Ambulatory Visit | Attending: Radiation Oncology | Admitting: Radiation Oncology

## 2020-05-21 ENCOUNTER — Encounter: Payer: Self-pay | Admitting: Surgery

## 2020-05-21 ENCOUNTER — Inpatient Hospital Stay (HOSPITAL_BASED_OUTPATIENT_CLINIC_OR_DEPARTMENT_OTHER): Payer: Medicare Other | Admitting: Oncology

## 2020-05-21 ENCOUNTER — Ambulatory Visit: Payer: Medicare Other

## 2020-05-21 ENCOUNTER — Inpatient Hospital Stay: Payer: Medicare Other

## 2020-05-21 ENCOUNTER — Other Ambulatory Visit: Payer: Self-pay | Admitting: *Deleted

## 2020-05-21 ENCOUNTER — Other Ambulatory Visit: Payer: Self-pay

## 2020-05-21 VITALS — BP 129/72 | HR 64 | Temp 98.7°F | Wt 138.0 lb

## 2020-05-21 DIAGNOSIS — K1233 Oral mucositis (ulcerative) due to radiation: Secondary | ICD-10-CM | POA: Diagnosis not present

## 2020-05-21 DIAGNOSIS — Z510 Encounter for antineoplastic radiation therapy: Secondary | ICD-10-CM | POA: Diagnosis not present

## 2020-05-21 DIAGNOSIS — Z5111 Encounter for antineoplastic chemotherapy: Secondary | ICD-10-CM | POA: Diagnosis not present

## 2020-05-21 DIAGNOSIS — C109 Malignant neoplasm of oropharynx, unspecified: Secondary | ICD-10-CM

## 2020-05-21 LAB — CBC WITH DIFFERENTIAL/PLATELET
Abs Immature Granulocytes: 0.02 10*3/uL (ref 0.00–0.07)
Basophils Absolute: 0.1 10*3/uL (ref 0.0–0.1)
Basophils Relative: 1 %
Eosinophils Absolute: 0.1 10*3/uL (ref 0.0–0.5)
Eosinophils Relative: 1 %
HCT: 35.2 % — ABNORMAL LOW (ref 39.0–52.0)
Hemoglobin: 12.5 g/dL — ABNORMAL LOW (ref 13.0–17.0)
Immature Granulocytes: 0 %
Lymphocytes Relative: 11 %
Lymphs Abs: 0.7 10*3/uL (ref 0.7–4.0)
MCH: 35.9 pg — ABNORMAL HIGH (ref 26.0–34.0)
MCHC: 35.5 g/dL (ref 30.0–36.0)
MCV: 101.1 fL — ABNORMAL HIGH (ref 80.0–100.0)
Monocytes Absolute: 1 10*3/uL (ref 0.1–1.0)
Monocytes Relative: 16 %
Neutro Abs: 4.5 10*3/uL (ref 1.7–7.7)
Neutrophils Relative %: 71 %
Platelets: 392 10*3/uL (ref 150–400)
RBC: 3.48 MIL/uL — ABNORMAL LOW (ref 4.22–5.81)
RDW: 15.7 % — ABNORMAL HIGH (ref 11.5–15.5)
WBC: 6.3 10*3/uL (ref 4.0–10.5)
nRBC: 0 % (ref 0.0–0.2)

## 2020-05-21 LAB — COMPREHENSIVE METABOLIC PANEL
ALT: 19 U/L (ref 0–44)
AST: 28 U/L (ref 15–41)
Albumin: 3.5 g/dL (ref 3.5–5.0)
Alkaline Phosphatase: 68 U/L (ref 38–126)
Anion gap: 11 (ref 5–15)
BUN: 18 mg/dL (ref 8–23)
CO2: 28 mmol/L (ref 22–32)
Calcium: 9 mg/dL (ref 8.9–10.3)
Chloride: 96 mmol/L — ABNORMAL LOW (ref 98–111)
Creatinine, Ser: 0.73 mg/dL (ref 0.61–1.24)
GFR, Estimated: >60 mL/min (ref >60)
Glucose, Bld: 179 mg/dL — ABNORMAL HIGH (ref 70–99)
Potassium: 3.6 mmol/L (ref 3.5–5.1)
Sodium: 135 mmol/L (ref 135–145)
Total Bilirubin: 0.7 mg/dL (ref 0.3–1.2)
Total Protein: 6.7 g/dL (ref 6.5–8.1)

## 2020-05-21 LAB — PHOSPHORUS: Phosphorus: 2.6 mg/dL (ref 2.5–4.6)

## 2020-05-21 LAB — MAGNESIUM: Magnesium: 1.8 mg/dL (ref 1.7–2.4)

## 2020-05-21 MED ORDER — HEPARIN SOD (PORK) LOCK FLUSH 100 UNIT/ML IV SOLN
500.0000 [IU] | Freq: Once | INTRAVENOUS | Status: AC
  Administered 2020-05-21: 500 [IU] via INTRAVENOUS
  Filled 2020-05-21: qty 5

## 2020-05-21 MED ORDER — SODIUM CHLORIDE 0.9 % IV SOLN
Freq: Once | INTRAVENOUS | Status: AC
  Filled 2020-05-21: qty 250

## 2020-05-21 MED ORDER — FENTANYL 25 MCG/HR TD PT72: 1.0000 | MEDICATED_PATCH | TRANSDERMAL | 0 refills | Status: DC

## 2020-05-21 MED ORDER — HEPARIN SOD (PORK) LOCK FLUSH 100 UNIT/ML IV SOLN
INTRAVENOUS | Status: AC
  Filled 2020-05-21: qty 5

## 2020-05-21 MED ORDER — FENTANYL 25 MCG/HR TD PT72
1.0000 | MEDICATED_PATCH | TRANSDERMAL | 0 refills | Status: DC
Start: 2020-05-21 — End: 2020-05-21

## 2020-05-21 MED ORDER — SODIUM CHLORIDE 0.9% FLUSH
10.0000 mL | INTRAVENOUS | Status: DC | PRN
  Administered 2020-05-21: 10 mL via INTRAVENOUS
  Filled 2020-05-21: qty 10

## 2020-05-21 NOTE — Progress Notes (Signed)
Nutrition Follow-up:  Patient with tonsil cancer stage IV HPV+.  PEG placed on 10/20.    Spoke with patient and significant other Ronnie Ingram in clinic prior to MD visit. Patient reports that he has mostly been giving 4 cartons of tube feeding. Gave 5 cartons 1 time and felt nauseated.  "I just don't have any solid foods in my belly to mix with the liquids."  Reports diarrhea yesterday. Previously has been constipated.  Says he is taking protein liquid.  Patient frustrated and verbalized that every time he comes in clinic weight goes down and you want to take more and more of the tube feeding." Reports that he is unable to drink ensure/boost shakes or eat solid foods due to pain. Verbalized that he is hoping he does not have to do last 2 radiation treatments.     Medications: reviewed  Labs: reviewed  Anthropometrics:   Weight 138 lb 11/29 decreased from 144 lb on 11/22 146 lb on 11/15 148 lb on 11/4 156 lb on 10/25 162 lb on 9/27   Estimated Energy Needs  Kcals: 2100-2485 Protein: 105-124 g Fluid: >2.1 L  NUTRITION DIAGNOSIS: Inadequate oral intake continues receiving nutrition via feeding tube   INTERVENTION:  Recommend patient space out feedings out vs giving 2 cartons at one time. Ideally, patient needs 6 cartons of osmolite 1.5 with prosource 74ml BID.   Tube feeding will provide 2430 calories, 119 g protein and 1550ml free water.  Patient drinking fluids orally.  Encouraged taking nausea medications.   Discussed importance of giving tube feeding as patient not able to take in nutrition orally at this time.     MONITORING, EVALUATION, GOAL: weight trends, tube feeding   NEXT VISIT: phone call in 1 week  Ronnie Ingram B. Ronnie Ingram, [place], [place] Registered Dietitian [phone] (mobile)

## 2020-05-21 NOTE — Progress Notes (Signed)
Pt stable at discharge.  

## 2020-05-22 ENCOUNTER — Ambulatory Visit
Admission: RE | Admit: 2020-05-22 | Discharge: 2020-05-22 | Disposition: A | Discharge status: Home / Self Care | Payer: Medicare Other | Source: Ambulatory Visit | Attending: Radiation Oncology | Admitting: Radiation Oncology

## 2020-05-22 DIAGNOSIS — Z510 Encounter for antineoplastic radiation therapy: Secondary | ICD-10-CM | POA: Diagnosis not present

## 2020-05-22 NOTE — Progress Notes (Signed)
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 CT soft tissue neck showed a hyperenhancement of left palatine tonsil measuring 2.8 cm. Enlarged left level 2A lymph node measuring 3.4 cm in long axis and a smaller but conspicuous level 2B lymph node measuring 9 mm. PET CT scan showed marked hypermetabolism in the left tonsillar region with an SUV of 8.8. SUV 4.2 at the level 2 lymph node. The smaller level 2B lymph node is superimposed on the dominant necrotic node. 4.8 x 4.4 cm abdominal aortic aneurysm. 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 Baruch Gouty and plan is for concurrent chemoradiation.  Treatment interrupted by worsening mucositis requiring hospitalization and feeding tube placement.  Patient has completed 6 weekly cycles of cisplatin chemotherapy and completes radiation treatment this week  Interval history-patient continues to have difficulty swallowing and pain from mucositis which is not adequately controlled with 12 mcg fentanyl patch and as needed oxycodone.  He is finding it difficult to open his mouth.  Most of his nutrition has been through a PEG tube.  He has 3 more days of radiation treatment remaining  ECOG PS- 1 Pain scale- 7 Opioid associated constipation- no  Review of systems- Review of Systems  Constitutional: Positive for malaise/fatigue. Negative for chills,  fever and weight loss.  HENT: Negative for congestion, ear discharge and nosebleeds.        Throat pain  Eyes: Negative for blurred vision.  Respiratory: Negative for cough, hemoptysis, sputum production, shortness of breath and wheezing.   Cardiovascular: Negative for chest pain, palpitations, orthopnea and claudication.  Gastrointestinal: Negative for abdominal pain, blood in stool, constipation, diarrhea, heartburn, melena, nausea and vomiting.  Genitourinary: Negative for dysuria, flank pain, frequency, hematuria and urgency.  Musculoskeletal: Negative for back pain, joint pain and myalgias.  Skin: Negative for rash.  Neurological: Negative for dizziness, tingling, focal weakness, seizures, weakness and headaches.  Endo/Heme/Allergies: Does not bruise/bleed easily.  Psychiatric/Behavioral: Negative for depression and suicidal ideas. The patient does not have insomnia.       No Known Allergies   Past Medical History:  Diagnosis Date  . Arthritis   . Asthma   . Brain bleed ([place])    severak years ago after falling off ladder  . COPD (chronic obstructive pulmonary disease) ([place])   . Diverticulitis   . Hypertension   . Tonsil cancer [facility])      Past Surgical History:  Procedure Laterality Date  . APPENDECTOMY    . FRACTURE SURGERY Left    ORIF left forearm  . HERNIA REPAIR Left    inguinal  . HERNIA REPAIR     abd  . INGUINAL HERNIA REPAIR Right 09/06/2018   Procedure: HERNIA REPAIR INGUINAL ADULT, RIGHT;  Surgeon: Herbert Pun, MD;  Location: [facility] ORS;  Service: General;  Laterality: Right;  . PORTA CATH INSERTION N/A 03/14/2020   Procedure: PORTA CATH INSERTION;  Surgeon: Algernon Huxley, MD;  Location: [place] [facility];  Service:  Cardiovascular;  Laterality: N/A;    Social History   Socioeconomic History  . Marital status: Significant Other    Spouse name: Not on file  . Number of children: Not on file  . Years of education: Not on file  . Highest  education level: Not on file  Occupational History  . Not on file  Tobacco Use  . Smoking status: Current Every Day Smoker    Packs/day: 0.50    Years: 30.00    Pack years: 15.00    Types: Cigarettes  . Smokeless tobacco: Never Used  Vaping Use  . Vaping Use: Never used  Substance and Sexual Activity  . Alcohol use: Not Currently    Alcohol/week: 0.0 standard drinks    Comment: not drank anything from 3 weeks from 04/30/2020  . Drug use: Yes    Types: Marijuana  . Sexual activity: Not Currently  Other Topics Concern  . Not on file  Social History Narrative  . Not on file   Social Determinants of Health   Financial Resource Strain:   . Difficulty of Paying Living Expenses: Not on file  Food Insecurity:   . Worried About Charity fundraiser in the Last Year: Not on file  . Ran Out of Food in the Last Year: Not on file  Transportation Needs:   . Lack of Transportation (Medical): Not on file  . Lack of Transportation (Non-Medical): Not on file  Physical Activity:   . Days of Exercise per Week: Not on file  . Minutes of Exercise per Session: Not on file  Stress:   . Feeling of Stress : Not on file  Social Connections:   . Frequency of Communication with Friends and Family: Not on file  . Frequency of Social Gatherings with Friends and Family: Not on file  . Attends Religious Services: Not on file  . Active Member of Clubs or Organizations: Not on file  . Attends Archivist Meetings: Not on file  . Marital Status: Not on file  Intimate Partner Violence:   . Fear of Current or Ex-Partner: Not on file  . Emotionally Abused: Not on file  . Physically Abused: Not on file  . Sexually Abused: Not on file    No family history on file.   Current Outpatient Medications:  .  gabapentin (NEURONTIN) 300 MG capsule, Take 300 mg by mouth 2 (two) times daily as needed (pain). , Disp: , Rfl:  .  lidocaine-prilocaine (EMLA) cream, Apply to affected area once, Disp: 30 g,  Rfl: 3 .  lisinopril (PRINIVIL,ZESTRIL) 40 MG tablet, Take 40 mg by mouth daily. , Disp: , Rfl:  .  magic mouthwash w/lidocaine SOLN, Take 5 mLs by mouth 4 (four) times daily as needed for mouth pain., Disp: 340 mL, Rfl: 1 .  metoCLOPramide (REGLAN) 5 MG tablet, Take 1 tablet (5 mg total) by mouth every 8 (eight) hours as needed for nausea., Disp: 30 tablet, Rfl: 0 .  montelukast (SINGULAIR) 10 MG tablet, Take 10 mg by mouth daily., Disp: , Rfl:  .  morphine (ROXANOL) 20 MG/ML concentrated solution, Take 1 mL (20 mg total) by mouth every 4 (four) hours as needed for severe pain. Please pay for on byrd fund per [employer], Disp: 30 mL, Rfl: 0 .  omeprazole (PRILOSEC) 40 MG capsule, Take 1 capsule (40 mg total) by mouth daily., Disp: 30 capsule, Rfl: 0 .  pantoprazole (PROTONIX) 40 MG tablet, Take 1 tablet (40 mg total) by  mouth daily., Disp: 30 tablet, Rfl: 0 .  polyethylene glycol powder (MIRALAX) 17 GM/SCOOP powder, Take 255 g by mouth in the morning and at bedtime for 60 doses., Disp: 15300 g, Rfl: 1 .  Sennosides 8.8 MG/ML LIQD, Take 10 mLs (88 mg total) by mouth daily., Disp: 300 mL, Rfl: 1 .  tiotropium (SPIRIVA) 18 MCG inhalation capsule, Place 18 mcg into inhaler and inhale daily. , Disp: , Rfl:  .  Water For Irrigation, Sterile (FREE WATER) SOLN, Place 120 mLs into feeding tube 6 (six) times daily., Disp: 1000 mL, Rfl: 1 .  Clotrimazole 1 % OINT, Apply a thin layer to the affected area twice daily as needed. (Patient not taking: Reported on 04/30/2020), Disp: 30 g, Rfl: 0 .  fentaNYL (DURAGESIC) 25 MCG/HR, Place 1 patch onto the skin every 3 (three) days., Disp: 10 patch, Rfl: 0 No current facility-administered medications for this visit.  Facility-Administered Medications Ordered in Other Visits:  .  sodium chloride flush (NS) 0.9 % injection 10 mL, 10 mL, Intravenous, PRN, Lloyd Huger, MD, 10 mL at 04/23/20 0850  Physical exam:  Vitals:   05/21/20 1154  BP: 129/72  Pulse: 64   Temp: 98.7 F (37.1 C)  TempSrc: Tympanic  SpO2: 97%  Weight: 138 lb (62.6 kg)   Physical Exam HENT:     Mouth/Throat:     Comments: Grade 2 mucositis noted Cardiovascular:     Rate and Rhythm: Normal rate and regular rhythm.     Heart sounds: Normal heart sounds.  Pulmonary:     Effort: Pulmonary effort is normal.     Breath sounds: Normal breath sounds.  Abdominal:     General: Bowel sounds are normal.     Palpations: Abdomen is soft.  Lymphadenopathy:     Comments: Right cervical lymph node is just about palpable  Skin:    General: Skin is warm and dry.  Neurological:     Mental Status: He is alert and oriented to person, place, and time.      CMP Latest Ref Rng & Units 05/21/2020  Glucose 70 - 99 mg/dL 179(H)  BUN 8 - 23 mg/dL 18  Creatinine 0.61 - 1.24 mg/dL 0.73  Sodium 135 - 145 mmol/L 135  Potassium 3.5 - 5.1 mmol/L 3.6  Chloride 98 - 111 mmol/L 96(L)  CO2 22 - 32 mmol/L 28  Calcium 8.9 - 10.3 mg/dL 9.0  Total Protein 6.5 - 8.1 g/dL 6.7  Total Bilirubin 0.3 - 1.2 mg/dL 0.7  Alkaline Phos 38 - 126 U/L 68  AST 15 - 41 U/L 28  ALT 0 - 44 U/L 19   CBC Latest Ref Rng & Units 05/21/2020  WBC 4.0 - 10.5 K/uL 6.3  Hemoglobin 13.0 - 17.0 g/dL 12.5(L)  Hematocrit 39 - 52 % 35.2(L)  Platelets 150 - 400 K/uL 392      Assessment and plan- Patient is a 64 y.o. 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   He has completed 6 weekly cycles of cisplatin chemotherapy and will complete radiation treatment in 3 days.  This is a routine visit for follow-up of ongoing radiation mucositis  Patient continues to have grade 2 mucositis and is in significant pain from the same.  He has not received any chemotherapy this week.  His last chemo was given on 05/07/2020.  I have asked him to increase his fentanyl patch from 12 mcg to 25 mcg while  he continues as needed oxycodone.  We will plan to give him 1 L of IV  fluids today and later this week as well.  I will see him back in 2 weeks for possible IV fluids.  Plan is to repeat PET scan after 3 months of completing radiation treatment  He is still losing weight but has a PEG tube in place for his nutritional needs.  Explained to the patient that xerostomia mucositis will likely persist for the next 4 to 6 weeks before it starts getting better.   Visit Diagnosis 1. Mucositis due to radiation therapy      Dr. Randa Evens, MD, MPH [facility] [phone] 05/22/2020 12:40 PM               Follow-up

## 2020-05-25 ENCOUNTER — Inpatient Hospital Stay: Payer: Medicare Other | Attending: Oncology

## 2020-05-25 ENCOUNTER — Encounter (INDEPENDENT_AMBULATORY_CARE_PROVIDER_SITE_OTHER): Payer: Self-pay | Admitting: Vascular Surgery

## 2020-06-04 ENCOUNTER — Inpatient Hospital Stay: Payer: Medicare Other

## 2020-06-04 ENCOUNTER — Inpatient Hospital Stay: Payer: Medicare Other | Attending: Oncology | Admitting: Oncology

## 2020-06-04 ENCOUNTER — Ambulatory Visit: Payer: Medicare Other

## 2020-06-04 ENCOUNTER — Telehealth: Payer: Self-pay

## 2020-06-04 ENCOUNTER — Ambulatory Visit: Payer: Medicare Other | Admitting: Oncology

## 2020-06-04 NOTE — Telephone Encounter (Signed)
Nutrition  Patient did not show up for MD appointment and subsequent nutrition appointment today.    RD called patient's mobile number for follow-up and no answer or option to leave voicemail as mailbox not set up.  Called [name] significant other and left message on voicemail requesting call back.   Alyaan Budzynski B. Zenia Resides, [place], [place] Registered Dietitian [phone] (mobile)

## 2020-06-07 ENCOUNTER — Other Ambulatory Visit: Payer: Self-pay

## 2020-06-07 DIAGNOSIS — C109 Malignant neoplasm of oropharynx, unspecified: Secondary | ICD-10-CM

## 2020-06-13 ENCOUNTER — Inpatient Hospital Stay: Payer: Medicare Other | Attending: Oncology

## 2020-06-18 ENCOUNTER — Other Ambulatory Visit: Payer: Self-pay | Admitting: *Deleted

## 2020-06-18 ENCOUNTER — Ambulatory Visit: Payer: Medicare Other

## 2020-06-18 DIAGNOSIS — C109 Malignant neoplasm of oropharynx, unspecified: Secondary | ICD-10-CM

## 2020-06-19 ENCOUNTER — Telehealth: Payer: Self-pay | Admitting: *Deleted

## 2020-06-19 NOTE — Telephone Encounter (Signed)
Called Ronnie Ingram to tell her of the appt with dr pabon 1/5 at 2 pm. [name] already knows that and says that pt has not been seen in 5-6 weeks and he is doing good but still feels knot and wants the pet scan moved up. I told her that I will ask Janese Banks and get back with her.

## 2020-06-20 ENCOUNTER — Telehealth: Payer: Self-pay | Admitting: *Deleted

## 2020-06-20 MED ORDER — FLUCONAZOLE 100 MG PO TABS: 100.0000 mg | ORAL_TABLET | Freq: Every day | ORAL | 0 refills | Status: DC

## 2020-06-20 MED ORDER — MAGIC MOUTHWASH W/LIDOCAINE: 5.0000 mL | Freq: Four times a day (QID) | ORAL | 0 refills | Status: DC | PRN

## 2020-06-20 NOTE — Telephone Encounter (Signed)
Ronnie Ingram called to say that pt has mouth sores-cheeks, tongue, back of throat. Tongue looks like white patches. Has dry mouth and thick saliva. My second conversation with Ronnie Ingram that pt has been drinking beer for last 2 weeks and wondered if that could have made the sores come back. I sent message to Ronnie Ingram and she says that pt can have diflucan and MMW and both will be [facility] per Ronnie Ingram and Dr. Janese Ingram.Ronnie Ingram notified about this and rx sent to pharmacy

## 2020-06-25 NOTE — Progress Notes (Signed)
Nutrition Follow-up:  Patient with tonsil cancer stage IV HPV+.  PEG placed 10/20.  Patient scheduled to have tube removed on 1/5.    Spoke with [name] via phone as patient did not answer phone and does not have voicemail set up.  [name] reports that patient is eating well. Did have thrush which is better. She says this did not effect eating. Has not been using feeding tube but keeping it flushed.  [name] says that he has not been drinking ensure shakes since about 12/14. Reports that he ate 4 eggs, ham, biscuit with jelly and grits for breakfast this am.  Likely will not eat again until later tonight and she is encouraging him to eat in between.    [name] reports that he has gained weight.   Anthropometrics:   Weight per Penny 146 lb (increased from last weight of 138 lb on 11/29)   NUTRITION DIAGNOSIS: Inadequate oral intake improved   INTERVENTION:  Encouraged increased calories and protein to maintain weight RD available if needed in the future.     NEXT VISIT: no follow-up  Ronnie Doo B. Zenia Resides, [place], [place] Registered Dietitian [phone] (mobile)

## 2020-06-27 ENCOUNTER — Encounter: Payer: Self-pay | Admitting: Surgery

## 2020-06-27 ENCOUNTER — Ambulatory Visit (INDEPENDENT_AMBULATORY_CARE_PROVIDER_SITE_OTHER): Payer: Medicare Other | Admitting: Surgery

## 2020-06-27 ENCOUNTER — Other Ambulatory Visit: Payer: Self-pay

## 2020-06-27 DIAGNOSIS — E43 Unspecified severe protein-calorie malnutrition: Secondary | ICD-10-CM

## 2020-06-27 DIAGNOSIS — Z431 Encounter for attention to gastrostomy: Secondary | ICD-10-CM

## 2020-06-27 NOTE — Patient Instructions (Signed)
Please keep a dressing over the area until it heals completely.

## 2020-06-27 NOTE — Progress Notes (Signed)
Outpatient Surgical Follow Up  06/27/2020  Ronnie Ingram is an 65 y.o. male.   Chief Complaint  Patient presents with  . Follow-up    Discuss g-tube removal     HPI: Ronnie Ingram is a 65 year old male with head and neck cancer status post chemotherapy and radiation therapy.  He needed robotic PEG tube due to malnutrition and dysphagia.  Now he is tolerating regular diet and has completed therapy.  No fevers no chills he has not lost any weight.  He is adamant that he wants the tube out  Past Medical History:  Diagnosis Date  . Arthritis   . Asthma   . Brain bleed ([place])    severak years ago after falling off ladder  . COPD (chronic obstructive pulmonary disease) ([place])   . Diverticulitis   . Hypertension   . Tonsil cancer [facility])     Past Surgical History:  Procedure Laterality Date  . APPENDECTOMY    . FRACTURE SURGERY Left    ORIF left forearm  . HERNIA REPAIR Left    inguinal  . HERNIA REPAIR     abd  . INGUINAL HERNIA REPAIR Right 09/06/2018   Procedure: HERNIA REPAIR INGUINAL ADULT, RIGHT;  Surgeon: Herbert Pun, MD;  Location: [facility] ORS;  Service: General;  Laterality: Right;  . PORTA CATH INSERTION N/A 03/14/2020   Procedure: PORTA CATH INSERTION;  Surgeon: Algernon Huxley, MD;  Location: [place] CV LAB;  Service: Cardiovascular;  Laterality: N/A;    History reviewed. No pertinent family history.  Social History:  reports that he has been smoking cigarettes. He has a 15.00 pack-year smoking history. He has never used smokeless tobacco. He reports previous alcohol use. He reports current drug use. Drug: Marijuana.  Allergies: No Known Allergies  Medications reviewed.    ROS Full ROS performed and is otherwise negative other than what is stated in HPI   BP (!) 170/88   Pulse (!) 56   Temp 97.9 F (36.6 C) (Oral)   Ht 5\' 9"  (1.753 m)   Wt 141 lb (64 kg)   SpO2 96%   BMI 20.82 kg/m   Physical Exam Vitals and nursing note reviewed. Exam conducted  with a chaperone present.  Constitutional:      General: He is not in acute distress.    Appearance: Normal appearance. He is normal weight.  Pulmonary:     Effort: Pulmonary effort is normal.     Breath sounds: No stridor.  Abdominal:     General: Abdomen is flat. There is no distension.     Palpations: Abdomen is soft. There is no mass.     Tenderness: There is no abdominal tenderness. There is no guarding or rebound.     Hernia: No hernia is present.     Comments: Gastrostomy tube in place.  Using a syringe were able to deflate balloon and remove the tube without any complications.  A sterile dressing was applied.  Musculoskeletal:        General: No swelling. Normal range of motion.  Skin:    General: Skin is warm and dry.     Capillary Refill: Capillary refill takes less than 2 seconds.  Neurological:     General: No focal deficit present.     Mental Status: He is alert and oriented to person, place, and time.  Psychiatric:        Mood and Affect: Mood normal.        Behavior: Behavior normal.  Thought Content: Thought content normal.        Judgment: Judgment normal.      Assessment/Plan:  1. Protein-calorie malnutrition, severe ([place]) 65 year old male status post robotic G-tube 3 months ago.  Doing well without complications from G-tube.  Completed chemotherapy and radiation therapy.  Patient wishes to have G-tube removed.  Discussed with patient in detail about potential for needing it again and he understands the risk.  Also discussed with Dr. Janese Banks in detail. We removed the tube without any complications.   Greater than 50% of the 25 minutes  visit was spent in counseling/coordination of care   Caroleen Hamman, MD [place] Surgeon

## 2020-07-02 ENCOUNTER — Other Ambulatory Visit: Payer: Self-pay

## 2020-07-02 ENCOUNTER — Inpatient Hospital Stay: Payer: Medicare Other | Attending: Oncology | Admitting: Oncology

## 2020-07-02 ENCOUNTER — Encounter: Payer: Self-pay | Admitting: *Deleted

## 2020-07-02 ENCOUNTER — Ambulatory Visit: Payer: Medicare Other | Admitting: Radiation Oncology

## 2020-07-02 ENCOUNTER — Encounter: Payer: Self-pay | Admitting: Oncology

## 2020-07-02 VITALS — BP 148/92 | HR 67 | Temp 96.6°F | Wt 144.0 lb

## 2020-07-02 DIAGNOSIS — I10 Essential (primary) hypertension: Secondary | ICD-10-CM | POA: Diagnosis not present

## 2020-07-02 DIAGNOSIS — Z79899 Other long term (current) drug therapy: Secondary | ICD-10-CM | POA: Insufficient documentation

## 2020-07-02 DIAGNOSIS — Z923 Personal history of irradiation: Secondary | ICD-10-CM | POA: Diagnosis not present

## 2020-07-02 DIAGNOSIS — B370 Candidal stomatitis: Secondary | ICD-10-CM | POA: Diagnosis not present

## 2020-07-02 DIAGNOSIS — F17210 Nicotine dependence, cigarettes, uncomplicated: Secondary | ICD-10-CM | POA: Insufficient documentation

## 2020-07-02 DIAGNOSIS — C109 Malignant neoplasm of oropharynx, unspecified: Secondary | ICD-10-CM | POA: Insufficient documentation

## 2020-07-02 MED ORDER — NYSTATIN 100000 UNIT/ML MT SUSP: 5.0000 mL | Freq: Four times a day (QID) | OROMUCOSAL | 0 refills | Status: DC

## 2020-07-02 NOTE — Progress Notes (Signed)
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 CT soft tissue neck showed a hyperenhancement of left palatine tonsil measuring 2.8 cm. Enlarged left level 2A lymph node measuring 3.4 cm in long axis and a smaller but conspicuous level 2B lymph node measuring 9 mm. PET CT scan showed marked hypermetabolism in the left tonsillar region with an SUV of 8.8. SUV 4.2 at the level 2 lymph node. The smaller level 2B lymph node is superimposed on the dominant necrotic node. 4.8 x 4.4 cm abdominal aortic aneurysm. 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 Baruch Gouty and plan is for concurrent chemoradiation.  Treatment interrupted by worsening mucositis requiring hospitalization and feeding tube placement.  Patient has completed 6 weekly cycles of cisplatin chemotherapy and concurrent chemoradiation in November 2021  Interval history-patient reports that he is eating and drinking better and trying to gain weight back.  He does report dry mouth and has to constantly drink water to keep it moist.  PEG tube has been taken out.  Denies throat pain.  ECOG PS- 1 Pain scale- 0 Opioid associated constipation- no  Review of systems- Review of Systems  Constitutional: Negative for chills, fever and weight loss.  HENT: Negative for  congestion, ear discharge and nosebleeds.   Eyes: Negative for blurred vision.  Respiratory: Negative for cough, hemoptysis, sputum production, shortness of breath and wheezing.   Cardiovascular: Negative for chest pain, palpitations, orthopnea and claudication.  Gastrointestinal: Negative for abdominal pain, blood in stool, constipation, diarrhea, heartburn, melena, nausea and vomiting.  Genitourinary: Negative for dysuria, flank pain, frequency, hematuria and urgency.  Musculoskeletal: Negative for back pain, joint pain and myalgias.  Skin: Negative for rash.  Neurological: Negative for dizziness, tingling, focal weakness, seizures, weakness and headaches.  Endo/Heme/Allergies: Does not bruise/bleed easily.  Psychiatric/Behavioral: Negative for depression and suicidal ideas. The patient does not have insomnia.        No Known Allergies   Past Medical History:  Diagnosis Date  . Arthritis   . Asthma   . Brain bleed ([place])    [place] years ago after falling off ladder  . COPD (chronic obstructive pulmonary disease) ([place])   . Diverticulitis   . Hypertension   . 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 APPENDECTOMY    . FRACTURE SURGERY Left    ORIF left forearm  . HERNIA REPAIR Left    inguinal  . HERNIA REPAIR     abd  . INGUINAL HERNIA REPAIR Right 09/06/2018   Procedure: HERNIA REPAIR INGUINAL ADULT, RIGHT;  Surgeon: Herbert Pun, MD;  Location: [facility] ORS;  Service: General;  Laterality: Right;  . PORTA CATH INSERTION N/A 03/14/2020   Procedure: PORTA CATH INSERTION;  Surgeon: Algernon Huxley, MD;  Location: [facility];  Service: Cardiovascular;  Laterality: N/A;    Social History   Socioeconomic  History  . Marital status: Significant Other    Spouse name: Not on file  . Number of children: Not on file  . Years of education: Not on file  . Highest education level: Not on file  Occupational History  . Not on file   Tobacco Use  . Smoking status: Current Every Day Smoker    Packs/day: 0.50    Years: 30.00    Pack years: 15.00    Types: Cigarettes  . Smokeless tobacco: Never Used  Vaping Use  . Vaping Use: Never used  Substance and Sexual Activity  . Alcohol use: Not Currently    Alcohol/week: 0.0 standard drinks    Comment: not drank anything from 3 weeks from 04/30/2020  . Drug use: Yes    Types: Marijuana  . Sexual activity: Not Currently  Other Topics Concern  . Not on file  Social History Narrative  . Not on file   Social Determinants of Health   Financial Resource Strain: Not on file  Food Insecurity: Not on file  Transportation Needs: Not on file  Physical Activity: Not on file  Stress: Not on file  Social Connections: Not on file  Intimate Partner Violence: Not on file    No family history on file.   Current Outpatient Medications:  .  Clotrimazole 1 % OINT, Apply a thin layer to the affected area twice daily as needed., Disp: 30 g, Rfl: 0 .  gabapentin (NEURONTIN) 300 MG capsule, Take 300 mg by mouth 2 (two) times daily as needed (pain). , Disp: , Rfl:  .  magic mouthwash w/lidocaine SOLN, Take 5 mLs by mouth 4 (four) times daily as needed for mouth pain., Disp: 420 mL, Rfl: 0 .  nystatin (MYCOSTATIN) 100000 UNIT/ML suspension, Take 5 mLs (500,000 Units total) by mouth 4 (four) times daily. Swish in  mouth for at least 30 seconds, using it like a mouthwash before you swallow it. Do not eat or drink anything for 30 minutes after taking it, Disp: 60 mL, Rfl: 0 .  omeprazole (PRILOSEC) 40 MG capsule, Take 1 capsule (40 mg total) by mouth daily., Disp: 30 capsule, Rfl: 0 .  senna (SENOKOT) 176 MG/5ML SYRP, Take 2.5 mLs by mouth daily., Disp: , Rfl:  .  tiotropium (SPIRIVA) 18 MCG inhalation capsule, Place 18 mcg into inhaler and inhale daily. , Disp: , Rfl:  No current facility-administered medications for this visit.  Facility-Administered Medications Ordered in Other Visits:   .  sodium chloride flush (NS) 0.9 % injection 10 mL, 10 mL, Intravenous, PRN, Lloyd Huger, MD, 10 mL at 04/23/20 0850  Physical exam:  Vitals:   07/02/20 1403  BP: (!) 148/92  Pulse: 67  Temp: (!) 96.6 F (35.9 C)  TempSrc: Tympanic  SpO2: 98%  Weight: 144 lb (65.3 kg)   Physical Exam HENT:     Mouth/Throat:     Comments: Small oval area of possible thrush noted over the left part of oropharynx Eyes:     Extraocular Movements: EOM normal.  Cardiovascular:     Rate and Rhythm: Normal rate and regular rhythm.     Heart sounds: Normal heart sounds.  Pulmonary:     Effort: Pulmonary effort is normal.     Breath sounds: Normal breath sounds.  Abdominal:     General: Bowel sounds are normal.     Palpations: Abdomen is soft.  Musculoskeletal:     Cervical back: Normal range of motion.  Skin:    General:  Skin is warm and dry.  Neurological:     Mental Status: He is alert and oriented to person, place, and time.      CMP Latest Ref Rng & Units 05/21/2020  Glucose 70 - 99 mg/dL 179(H)  BUN 8 - 23 mg/dL 18  Creatinine 0.61 - 1.24 mg/dL 0.73  Sodium 135 - 145 mmol/L 135  Potassium 3.5 - 5.1 mmol/L 3.6  Chloride 98 - 111 mmol/L 96(L)  CO2 22 - 32 mmol/L 28  Calcium 8.9 - 10.3 mg/dL 9.0  Total Protein 6.5 - 8.1 g/dL 6.7  Total Bilirubin 0.3 - 1.2 mg/dL 0.7  Alkaline Phos 38 - 126 U/L 68  AST 15 - 41 U/L 28  ALT 0 - 44 U/L 19   CBC Latest Ref Rng & Units 05/21/2020  WBC 4.0 - 10.5 K/uL 6.3  Hemoglobin 13.0 - 17.0 g/dL 12.5(L)  Hematocrit 39.0 - 52.0 % 35.2(L)  Platelets 150 - 400 K/uL 392     Assessment and plan- Patient is a 65 y.o. male with stage I HPV positive squamous cell carcinoma of the oropharynx here for routine follow-up  Clinically patient is doing well and I do not palpate any cervical adenopathy at this time.  I will also touch base with Dr. Darien Ramus office to reestablish follow-up for subsequent exams.  He has a PET scan scheduled on 08/21/2020  and I will see him thereafter  Thrush: It is not confluent and there is a small area in the oropharynx which is possibly involved.  I will give him a prescription for nystatin swish and swallow   Visit Diagnosis 1. Squamous cell carcinoma of oropharynx (HCC)      Dr. Randa Evens, MD, MPH [facility] [phone] 07/02/2020 4:19 PM

## 2020-07-04 ENCOUNTER — Ambulatory Visit: Payer: Medicare Other | Admitting: Surgery

## 2020-07-09 ENCOUNTER — Ambulatory Visit: Payer: Medicare Other | Attending: Radiation Oncology | Admitting: Radiation Oncology

## 2020-07-13 ENCOUNTER — Ambulatory Visit: Payer: Medicare Other | Admitting: Oncology

## 2020-08-21 ENCOUNTER — Ambulatory Visit
Admission: RE | Admit: 2020-08-21 | Discharge: 2020-08-21 | Disposition: A | Discharge status: Home / Self Care | Payer: Medicare Other | Source: Ambulatory Visit | Attending: Oncology | Admitting: Oncology

## 2020-08-21 ENCOUNTER — Other Ambulatory Visit: Payer: Self-pay

## 2020-08-21 DIAGNOSIS — I2510 Atherosclerotic heart disease of native coronary artery without angina pectoris: Secondary | ICD-10-CM | POA: Diagnosis not present

## 2020-08-21 DIAGNOSIS — I700 Atherosclerosis of aorta: Secondary | ICD-10-CM | POA: Insufficient documentation

## 2020-08-21 DIAGNOSIS — G9389 Other specified disorders of brain: Secondary | ICD-10-CM | POA: Diagnosis not present

## 2020-08-21 DIAGNOSIS — I714 Abdominal aortic aneurysm, without rupture: Secondary | ICD-10-CM | POA: Insufficient documentation

## 2020-08-21 DIAGNOSIS — C109 Malignant neoplasm of oropharynx, unspecified: Secondary | ICD-10-CM | POA: Insufficient documentation

## 2020-08-21 LAB — GLUCOSE, CAPILLARY: Glucose-Capillary: 105 mg/dL — ABNORMAL HIGH (ref 70–99)

## 2020-08-21 MED ORDER — FLUDEOXYGLUCOSE F - 18 (FDG) INJECTION
7.5000 | Freq: Once | INTRAVENOUS | Status: AC | PRN
  Administered 2020-08-21: 8 via INTRAVENOUS

## 2020-08-22 ENCOUNTER — Other Ambulatory Visit: Payer: Self-pay

## 2020-08-22 ENCOUNTER — Telehealth: Payer: Self-pay | Admitting: *Deleted

## 2020-08-22 DIAGNOSIS — I714 Abdominal aortic aneurysm, without rupture, unspecified: Secondary | ICD-10-CM

## 2020-08-22 NOTE — Telephone Encounter (Signed)
Pt called because they could not see anything except for throat cancer. I checked with Dr. Rogue Bussing looked at the results. It looked like that there was good results with chemo and radiation treatment. Marked reduction in size and activity of the left tonsillar mass. No new active malignancy identified. He does have a AAA that has increased since last exam of 2021. [name] said that he has seen dr dew in past and also he has 4 relatives that have died from AAA.

## 2020-08-22 NOTE — Telephone Encounter (Signed)
Called dr dew office and asked for an appt. Per staff he has not been seen in a while and they will need a ref. And one was entered and [place] said the staff at the [facility] will call pt for appt.

## 2020-08-27 ENCOUNTER — Ambulatory Visit (INDEPENDENT_AMBULATORY_CARE_PROVIDER_SITE_OTHER): Payer: Medicare Other | Admitting: Vascular Surgery

## 2020-08-27 ENCOUNTER — Encounter (INDEPENDENT_AMBULATORY_CARE_PROVIDER_SITE_OTHER): Payer: Self-pay | Admitting: Vascular Surgery

## 2020-08-27 ENCOUNTER — Other Ambulatory Visit: Payer: Self-pay

## 2020-08-27 VITALS — BP 129/91 | HR 64 | Ht 70.0 in | Wt 139.0 lb

## 2020-08-27 DIAGNOSIS — J449 Chronic obstructive pulmonary disease, unspecified: Secondary | ICD-10-CM | POA: Diagnosis not present

## 2020-08-27 DIAGNOSIS — I713 Abdominal aortic aneurysm, ruptured, unspecified: Secondary | ICD-10-CM

## 2020-08-27 DIAGNOSIS — K219 Gastro-esophageal reflux disease without esophagitis: Secondary | ICD-10-CM

## 2020-08-27 DIAGNOSIS — I10 Essential (primary) hypertension: Secondary | ICD-10-CM

## 2020-08-27 NOTE — Progress Notes (Signed)
MRN : [id number]  Ronnie Ingram is a 65 y.o. (1956/01/29) male who presents with chief complaint of  Chief Complaint  Patient presents with  . New Patient (Initial Visit)    [place] . AAA without rupture . 5.2 CM AAA by NM PET imaging on 08/21/20  .  History of Present Illness:   The patient presents to the office for evaluation of an abdominal aortic aneurysm. The aneurysm was found incidentally by PET scan. Patient denies abdominal pain or unusual back pain, no other abdominal complaints.  No history of an acute onset of painful blue discoloration of the toes.   His Oropharyngeal CA has been treated and he has done well.    Strong family history of AAA.   Patient denies amaurosis fugax or TIA symptoms. There is no history of claudication or rest pain symptoms of the lower extremities.  The patient denies angina or shortness of breath.  CT scan shows an AAA that measures 5.00 cm  Current Meds  Medication Sig  . montelukast (SINGULAIR) 10 MG tablet Take 10 mg by mouth.  Marland Kitchen omeprazole (PRILOSEC) 40 MG capsule Take 1 capsule (40 mg total) by mouth daily.  Marland Kitchen tiotropium (SPIRIVA) 18 MCG inhalation capsule Place 18 mcg into inhaler and inhale daily.     Past Medical History:  Diagnosis Date  . Arthritis   . Asthma   . Brain bleed ([place])    [place] years ago after falling off ladder  . COPD (chronic obstructive pulmonary disease) ([place])   . Diverticulitis   . Hypertension   . Tonsil cancer [facility])     Past Surgical History:  Procedure Laterality Date  . APPENDECTOMY    . FRACTURE SURGERY Left    ORIF left forearm  . HERNIA REPAIR Left    inguinal  . HERNIA REPAIR     abd  . INGUINAL HERNIA REPAIR Right 09/06/2018   Procedure: HERNIA REPAIR INGUINAL ADULT, RIGHT;  Surgeon: Herbert Pun, MD;  Location: [facility] ORS;  Service: General;  Laterality: Right;  . PORTA CATH INSERTION N/A 03/14/2020   Procedure: PORTA CATH INSERTION;  Surgeon: Algernon Huxley, MD;  Location: [place];  Service: Cardiovascular;  Laterality: N/A;    Social History Social History   Tobacco Use  . Smoking status: Current Every Day Smoker    Packs/day: 0.50    Years: 30.00    Pack years: 15.00    Types: Cigarettes  . Smokeless tobacco: Never Used  Vaping Use  . Vaping Use: Never used  Substance Use Topics  . Alcohol use: Not Currently    Alcohol/week: 0.0 standard drinks    Comment: not drank anything from 3 weeks from 04/30/2020  . Drug use: Yes    Types: Marijuana    Family History No family history of bleeding/clotting disorders, porphyria or autoimmune disease   No Known Allergies   REVIEW OF SYSTEMS (Negative unless checked)  Constitutional: [] Weight loss  [] Fever  [] Chills Cardiac: [] Chest pain   [] Chest pressure   [] Palpitations   [] Shortness of breath when laying flat   [] Shortness of breath with exertion. Vascular:  [] Pain in legs with walking   [] Pain in legs at rest  [] History of DVT   [] Phlebitis   [] Swelling in legs   [] Varicose veins   [] Non-healing ulcers Pulmonary:   [] Uses home oxygen   [] Productive cough   [] Hemoptysis   [] Wheeze  [] COPD   [] Asthma Neurologic:  [] Dizziness   [] Seizures   []   History of stroke   [] History of TIA  [] Aphasia   [] Vissual changes   [] Weakness or numbness in arm   [] Weakness or numbness in leg Musculoskeletal:   [] Joint swelling   [x] Joint pain   [] Low back pain Hematologic:  [] Easy bruising  [] Easy bleeding   [] Hypercoagulable state   [] Anemic Gastrointestinal:  [] Diarrhea   [] Vomiting  [x] Gastroesophageal reflux/heartburn   [] Difficulty swallowing. Genitourinary:  [] Chronic kidney disease   [] Difficult urination  [] Frequent urination   [] Blood in urine Skin:  [] Rashes   [] Ulcers  Psychological:  [] History of anxiety   []  History of major depression.  Physical Examination  Vitals:   08/27/20 1503  BP: (!) 129/91  Pulse: 64  Weight: 139 lb (63 kg)  Height: 5\' 10"  (1.778 m)   Body mass index is 19.94  kg/m. Gen: WD/WN, NAD Head: [place]/AT, No temporalis wasting.  Ear/Nose/Throat: Hearing grossly intact, nares w/o erythema or drainage, poor dentition Eyes: PER, EOMI, sclera nonicteric.  Neck: Supple, no masses.  No bruit or JVD.  Pulmonary:  Good air movement, clear to auscultation bilaterally, no use of accessory muscles.  Cardiac: RRR, normal S1, S2, no Murmurs. Vascular:  Vessel Right Left  Radial Palpable Palpable  PT Palpable Palpable  DP Palpable Palpable  Gastrointestinal: soft, non-distended. No guarding/no peritoneal signs.  Musculoskeletal: M/S 5/5 throughout.  No deformity or atrophy.  Neurologic: CN 2-12 intact. Pain and light touch intact in extremities.  Symmetrical.  Speech is fluent. Motor exam as listed above. Psychiatric: Judgment intact, Mood & affect appropriate for pt's clinical situation. Dermatologic: No rashes or ulcers noted.  No changes consistent with cellulitis.   CBC Lab Results  Component Value Date   WBC 6.3 05/21/2020   HGB 12.5 (L) 05/21/2020   HCT 35.2 (L) 05/21/2020   MCV 101.1 (H) 05/21/2020   PLT 392 05/21/2020    BMET    Component Value Date/Time   NA 135 05/21/2020 1128   NA 136 11/09/2012 2115   K 3.6 05/21/2020 1128   K 3.8 11/09/2012 2115   CL 96 (L) 05/21/2020 1128   CL 103 11/09/2012 2115   CO2 28 05/21/2020 1128   CO2 25 11/09/2012 2115   GLUCOSE 179 (H) 05/21/2020 1128   GLUCOSE 112 (H) 11/09/2012 2115   BUN 18 05/21/2020 1128   BUN 16 11/09/2012 2115   CREATININE 0.73 05/21/2020 1128   CREATININE 0.92 11/09/2012 2115   CALCIUM 9.0 05/21/2020 1128   CALCIUM 9.1 11/09/2012 2115   GFRNONAA >60 05/21/2020 1128   GFRNONAA >60 11/09/2012 2115   GFRAA >60 03/26/2020 0815   GFRAA >60 11/09/2012 2115   CrCl cannot be calculated (Patient's most recent lab result is older than the maximum 21 days allowed.).  COAG No results found for: INR, PROTIME  Radiology NM PET Image Restag (PS) Skull Base To Thigh  Result Date:  08/22/2020 CLINICAL DATA:  Subsequent treatment strategy for squamous cell carcinoma of the oropharynx. EXAM: NUCLEAR MEDICINE PET SKULL BASE TO THIGH TECHNIQUE: 8.0 mCi F-18 FDG was injected intravenously. Full-ring PET imaging was performed from the skull base to thigh after the radiotracer. CT data was obtained and used for attenuation correction and anatomic localization. Fasting blood glucose: 105 mg/dl COMPARISON:  02/29/2020 FINDINGS: Mediastinal blood pool activity: SUV max 1.8 Liver activity: SUV max 2.9 NECK: Stable hypointensity inferiorly in the right frontal lobe with hypodensity in this region on the CT data compatible with encephalomalacia. This is a chronic finding. At the site of  the previously hypermetabolic left tonsillar mass, there is very minimal residual soft tissue fullness for example on image 37 of series 3, but with substantially reduced activity. Maximum SUV in this vicinity is 2.5 (previously 8.8) with contralateral normal side having a maximum SUV of 1.9. There was previously a left station 2 lymph node along the posterior margin of the left submandibular gland which is no longer readily measurable, and which is without hypermetabolic activity (previous maximum SUV 4.2). No new hypermetabolic lesions in the neck identified. Incidental CT findings: Bilateral common carotid artery atherosclerotic calcification. CHEST: No significant abnormal hypermetabolic activity in this region. Incidental CT findings: Right Port-A-Cath tip: SVC. Coronary, aortic arch, and branch vessel atherosclerotic vascular disease. Airway thickening is present, suggesting bronchitis or reactive airways disease. ABDOMEN/PELVIS: No significant abnormal hypermetabolic activity in this region. Incidental CT findings: Fusiform infrarenal abdominal aortic aneurysm 5.2 cm in transverse diameter on image 184 of series 3, previously 4.7 cm by my measurements. SKELETON: No significant abnormal hypermetabolic activity in this  region. Incidental CT findings: Shrapnel along the left hip. Thoracic kyphosis. IMPRESSION: 1. Resolution of prior left cervical adenopathy. Marked reduction in size and activity of the left tonsillar mass. No new active malignancy identified. 2. Infrarenal abdominal aortic aneurysm 5.2 cm in transverse dimension, previously 4.7 cm on 02/29/2020. Recommend referral to a vascular specialist. This recommendation follows ACR consensus guidelines: White Paper of the ACR Incidental Findings Committee II on Vascular Findings. J Am Coll Radiol 2013; 10:789-794. 3. Other imaging findings of potential clinical significance: Chronic right inferior frontal lobe encephalomalacia. Aortic Atherosclerosis (ICD10-I70.0). Coronary atherosclerosis. Airway thickening is present, suggesting bronchitis or reactive airways disease. Electronically Signed   By: Van Clines M.D.   On: 08/22/2020 08:03     Assessment/Plan 1. Abdominal aortic aneurysm, ruptured (HCC) Recommend: The patient has an abdominal aortic aneurysm that is 5.0 cm by duplex scan and based on this study it appears that it is suitable for endovascular treatment.  The patient is otherwise in reasonable health.   Therefore, the patient should undergo endovascular repair of the AAA to prevent future leathal rupture.   Patient will require CT angiography of the abdomen and pelvis in order to appropriately plan repair of the AAA.  The risks and benefits as well as the alternative therapies was discussed in detail with the patient. All questions were answered. The patient agrees to move forward the AAA repair and therefore with CT scan.  The patient will follow up with me in the office after the CT scan to review the study. - CT Angio Abd/Pel w/ and/or w/o; Future  2. Essential hypertension Continue antihypertensive medications as already ordered, these medications have been reviewed and there are no changes at this time.   3. Chronic obstructive  pulmonary disease, unspecified COPD type ([place]) Continue pulmonary medications and aerosols as already ordered, these medications have been reviewed and there are no changes at this time.    4. Gastroesophageal reflux disease without esophagitis Continue PPI as already ordered, this medication has been reviewed and there are no changes at this time.  Avoidence of caffeine and alcohol  Moderate elevation of the head of the bed     Hortencia Pilar, MD  08/27/2020 4:03 PM

## 2020-08-29 ENCOUNTER — Telehealth: Payer: Self-pay | Admitting: Oncology

## 2020-08-29 NOTE — Telephone Encounter (Signed)
Spoke with Pt's wife [name]) about r/s pt's appts from 3/11 to 3/18 (per MD). She was agreeable to this but mentioned that Mr. Elizardo could be potentially having heart surgery soon and she was waiting for return call from cardiologist. Advised Mrs. Foots to please call us back if there is a conflict.

## 2020-08-31 ENCOUNTER — Other Ambulatory Visit: Payer: Medicare Other

## 2020-08-31 ENCOUNTER — Inpatient Hospital Stay: Payer: Medicare Other

## 2020-08-31 ENCOUNTER — Inpatient Hospital Stay: Payer: Medicare Other | Admitting: Oncology

## 2020-09-03 ENCOUNTER — Other Ambulatory Visit: Payer: Medicare Other

## 2020-09-03 ENCOUNTER — Ambulatory Visit: Payer: Medicare Other | Admitting: Oncology

## 2020-09-07 ENCOUNTER — Inpatient Hospital Stay: Payer: Medicare Other

## 2020-09-07 ENCOUNTER — Inpatient Hospital Stay: Payer: Medicare Other | Admitting: Oncology

## 2020-09-10 ENCOUNTER — Telehealth: Payer: Self-pay | Admitting: Oncology

## 2020-09-10 NOTE — Telephone Encounter (Signed)
Left VM message for Ronnie Ingram (pt's sig other--pt hard of hearing) to please return call to r/s missed appt on 3/18.

## 2020-09-12 ENCOUNTER — Other Ambulatory Visit: Payer: Self-pay

## 2020-09-12 ENCOUNTER — Ambulatory Visit
Admission: RE | Admit: 2020-09-12 | Discharge: 2020-09-12 | Disposition: A | Discharge status: Home / Self Care | Payer: Medicare Other | Source: Ambulatory Visit | Attending: Vascular Surgery | Admitting: Vascular Surgery

## 2020-09-12 DIAGNOSIS — I713 Abdominal aortic aneurysm, ruptured, unspecified: Secondary | ICD-10-CM

## 2020-09-12 LAB — POCT I-STAT CREATININE: Creatinine, Ser: 0.8 mg/dL (ref 0.61–1.24)

## 2020-09-12 MED ORDER — IOHEXOL 350 MG/ML SOLN
100.0000 mL | Freq: Once | INTRAVENOUS | Status: AC | PRN
  Administered 2020-09-12: 100 mL via INTRAVENOUS

## 2020-09-14 ENCOUNTER — Telehealth: Payer: Self-pay | Admitting: Oncology

## 2020-09-14 DIAGNOSIS — I714 Abdominal aortic aneurysm, without rupture, unspecified: Secondary | ICD-10-CM | POA: Insufficient documentation

## 2020-09-14 NOTE — Telephone Encounter (Signed)
Spoke with pt's significant other, [name], to reschedule missed appt on 3/18. [name] stated that pt is having vascular surgery soon and would call back if the procedure would interfere with new appt time on 09/27/20.

## 2020-09-14 NOTE — H&P (View-Only) (Signed)
MRN : [id number]  Ronnie Ingram is a 65 y.o. (1956/02/06) male who presents with chief complaint of No chief complaint on file. Marland Kitchen  History of Present Illness:   The patient is seen for follow up evaluation of AAA status post CTA. There were no problems or complications related to the CT scan. The patient denies interval development of abdominal or back pain. No new lower extremity pain or discoloration of the toes.   The patient has a history of coronary artery disease, no recent episodes of angina or shortness of breath. The patient denies interval anaurosis fugax. There is o recent history of TIA symptoms or focal motor deficits. The patient denies PAD or claudication symptoms. There is a history of hyperlipidemia which is being treated with a statin.   CT angiography of the abdomen and pelvis shows an infrarenal AAA 5.0 cm.  No outpatient medications have been marked as taking for the 09/17/20 encounter (Appointment) with Delana Meyer, Dolores Lory, MD.    Past Medical History:  Diagnosis Date  . Arthritis   . Asthma   . Brain bleed ([place])    [place] years ago after falling off ladder  . COPD (chronic obstructive pulmonary disease) ([place])   . Diverticulitis   . Hypertension   . 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 APPENDECTOMY    . FRACTURE SURGERY Left    ORIF left forearm  . HERNIA REPAIR Left    inguinal  . HERNIA REPAIR     abd  . INGUINAL HERNIA REPAIR Right 09/06/2018   Procedure: HERNIA REPAIR INGUINAL ADULT, RIGHT;  Surgeon: Herbert Pun, MD;  Location: [facility] ORS;  Service: General;  Laterality: Right;  . PORTA CATH INSERTION N/A 03/14/2020   Procedure: PORTA CATH INSERTION;  Surgeon: Algernon Huxley, MD;  Location: [place] [facility];  Service: Cardiovascular;  Laterality: N/A;    Social History Social History   Tobacco Use  . Smoking status: Current Every Day Smoker    Packs/day: 0.50    Years: 30.00    Pack years:  15.00    Types: Cigarettes  . Smokeless tobacco: Never Used  Vaping Use  . Vaping Use: Never used  Substance Use Topics  . Alcohol use: Not Currently    Alcohol/week: 0.0 standard drinks    Comment: not drank anything from 3 weeks from 04/30/2020  . Drug use: Yes    Types: Marijuana    Family History No family history on file.  No Known Allergies   REVIEW OF SYSTEMS (Negative unless checked)  Constitutional: [] Weight loss  [] Fever  [] Chills Cardiac: [] Chest pain   [] Chest pressure   [] Palpitations   [] Shortness of breath when laying flat   [] Shortness of breath with exertion. Vascular:  [] Pain in legs with walking   [] Pain in legs at rest  [] History of DVT   [] Phlebitis   [] Swelling in legs   [] Varicose veins   [] Non-healing ulcers Pulmonary:   [] Uses home oxygen   [] Productive cough   [] Hemoptysis   [] Wheeze  [] COPD   [] Asthma Neurologic:  [] Dizziness   [] Seizures   [] History of stroke   [] History of TIA  [] Aphasia   [] Vissual changes   [] Weakness or numbness in arm   [] Weakness or numbness in leg Musculoskeletal:   [] Joint swelling   [] Joint pain   [] Low back pain Hematologic:  [] Easy bruising  [] Easy bleeding   [] Hypercoagulable state   [] Anemic Gastrointestinal:  []   Diarrhea   [] Vomiting  [x] Gastroesophageal reflux/heartburn   [] Difficulty swallowing. Genitourinary:  [] Chronic kidney disease   [] Difficult urination  [] Frequent urination   [] Blood in urine Skin:  [] Rashes   [] Ulcers  Psychological:  [] History of anxiety   []  History of major depression.  Physical Examination  There were no vitals filed for this visit. There is no height or weight on file to calculate BMI. Gen: WD/WN, NAD Head: [place]/AT, No temporalis wasting.  Ear/Nose/Throat: Hearing grossly intact, nares w/o erythema or drainage Eyes: PER, EOMI, sclera nonicteric.  Neck: Supple, no large masses.   Pulmonary:  Good air movement, no audible wheezing bilaterally, no use of accessory muscles.  Cardiac: RRR, no  JVD Vascular:  Vessel Right Left  Radial Palpable Palpable  Gastrointestinal: Non-distended. No guarding/no peritoneal signs.  Musculoskeletal: M/S 5/5 throughout.  No deformity or atrophy.  Neurologic: CN 2-12 intact. Symmetrical.  Speech is fluent. Motor exam as listed above. Psychiatric: Judgment intact, Mood & affect appropriate for pt's clinical situation. Dermatologic: No rashes or ulcers noted.  No changes consistent with cellulitis. enification or skin changes of chronic lymphedema.  CBC Lab Results  Component Value Date   WBC 6.3 05/21/2020   HGB 12.5 (L) 05/21/2020   HCT 35.2 (L) 05/21/2020   MCV 101.1 (H) 05/21/2020   PLT 392 05/21/2020    BMET    Component Value Date/Time   NA 135 05/21/2020 1128   NA 136 11/09/2012 2115   K 3.6 05/21/2020 1128   K 3.8 11/09/2012 2115   CL 96 (L) 05/21/2020 1128   CL 103 11/09/2012 2115   CO2 28 05/21/2020 1128   CO2 25 11/09/2012 2115   GLUCOSE 179 (H) 05/21/2020 1128   GLUCOSE 112 (H) 11/09/2012 2115   BUN 18 05/21/2020 1128   BUN 16 11/09/2012 2115   CREATININE 0.80 09/12/2020 0931   CREATININE 0.92 11/09/2012 2115   CALCIUM 9.0 05/21/2020 1128   CALCIUM 9.1 11/09/2012 2115   GFRNONAA >60 05/21/2020 1128   GFRNONAA >60 11/09/2012 2115   GFRAA >60 03/26/2020 0815   GFRAA >60 11/09/2012 2115   CrCl cannot be calculated (Unknown ideal weight.).  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 EXAM: NUCLEAR MEDICINE PET SKULL BASE TO THIGH TECHNIQUE: 8.0 mCi F-18 FDG was injected intravenously. Full-ring PET imaging was performed from the skull base to thigh after the radiotracer. CT data was obtained and used for attenuation correction and anatomic localization. Fasting blood glucose: 105 mg/dl COMPARISON:  02/29/2020 FINDINGS: Mediastinal blood pool  activity: SUV max 1.8 Liver activity: SUV max 2.9 NECK: Stable hypointensity inferiorly in the right frontal lobe with hypodensity in this region on the CT data compatible with encephalomalacia. This is a chronic finding. At the site of the previously hypermetabolic left tonsillar mass, there is very minimal residual soft tissue fullness for example on image 37 of series 3, but with substantially reduced activity. Maximum SUV in this vicinity is 2.5 (previously 8.8) with contralateral normal side having a maximum SUV of 1.9. There was previously a left station 2 lymph node along the posterior margin of the left submandibular gland which is no longer readily measurable, and which is without hypermetabolic activity (previous maximum SUV 4.2). No new hypermetabolic lesions in the neck identified. Incidental CT findings: Bilateral common carotid artery atherosclerotic calcification. CHEST: No significant  abnormal hypermetabolic activity in this region. Incidental CT findings: Right Port-A-Cath tip: SVC. Coronary, aortic arch, and branch vessel atherosclerotic vascular disease. Airway thickening is present, suggesting bronchitis or reactive airways disease. ABDOMEN/PELVIS: No significant abnormal hypermetabolic activity in this region. Incidental CT findings: Fusiform infrarenal abdominal aortic aneurysm 5.2 cm in transverse diameter on image 184 of series 3, previously 4.7 cm by my measurements. SKELETON: No significant abnormal hypermetabolic activity in this region. Incidental CT findings: Shrapnel along the left hip. Thoracic kyphosis. IMPRESSION: 1. Resolution of prior left cervical adenopathy. Marked reduction in size and activity of the left tonsillar mass. No new active malignancy identified. 2. Infrarenal abdominal aortic aneurysm 5.2 cm in transverse dimension, previously 4.7 cm on 02/29/2020. Recommend referral to a vascular specialist. This recommendation follows ACR consensus guidelines: White Paper of the  ACR Incidental Findings Committee II on Vascular Findings. J Am Coll Radiol 2013; 10:789-794. 3. Other imaging findings of potential clinical significance: Chronic right inferior frontal lobe encephalomalacia. Aortic Atherosclerosis (ICD10-I70.0). Coronary atherosclerosis. Airway thickening is present, suggesting bronchitis or reactive airways disease. Electronically Signed   By: Van Clines M.D.   On: 08/22/2020 08:03   CT Angio Abd/Pel w/ and/or w/o  Result Date: 09/12/2020 CLINICAL DATA:  Roughly 5 cm abdominal aortic aneurysm detected by PET scan. Further evaluation by CTA prior to planned endovascular repair. EXAM: CT ANGIOGRAPHY ABDOMEN AND PELVIS WITH CONTRAST TECHNIQUE: Multidetector CT imaging of the abdomen and pelvis was performed using the standard protocol during bolus administration of intravenous contrast. Multiplanar reconstructed images and MIPs were obtained and reviewed to evaluate the vascular anatomy. CONTRAST:  128mL OMNIPAQUE IOHEXOL 350 MG/ML SOLN COMPARISON:  PET scan on 08/21/2020 FINDINGS: VASCULAR Aorta: Infrarenal abdominal aortic aneurysm measures approximately 4.8 x 4.9 cm in greatest transverse dimensions. Aneurysm continues to the aortic bifurcation. No significant mural thrombus. No evidence of aneurysm rupture or aortic dissection. Infrarenal aneurysm neck measures 1.7-2.0 cm. Celiac: Normally patent. Celiac origin is intrathoracic and just above the diaphragmatic hiatus. No significant celiac compression by the diaphragm or arcuate ligament. Normal branch vessel anatomy and patency. SMA: Normally patent. Renals: Single right renal artery and 2 separate left renal arteries demonstrate no significant stenosis. IMA: The IMA remains patent. Inflow: Bilateral common iliac arteries demonstrate irregular and diffuse dilatation with the right common iliac artery reaching maximal caliber approximately 20 mm in its proximal segment and the left common iliac artery reaching  maximal caliber of approximately 18 mm in its mid segment. Bilateral internal and external iliac arteries demonstrate scattered calcified plaque without evidence of significant obstructive or aneurysmal disease. Proximal Outflow: Mildly dilated right common femoral artery measures up to 12 mm in diameter. Calcified plaque in its distal portion causes approximately 50-60% stenosis just above the femoral bifurcation. The left common femoral artery does not demonstrate significant stenosis and is mildly dilated with maximum caliber of 11 mm. Veins: Delayed imaging through the level of the upper abdomen to just below the kidneys demonstrates no venous abnormalities. Review of the MIP images confirms the above findings. NON-VASCULAR Lower chest: Mild scarring at the right lateral lung base. Hepatobiliary: No focal liver abnormality is seen. No gallstones, gallbladder wall thickening, or biliary dilatation. Pancreas: Unremarkable. No pancreatic ductal dilatation or surrounding inflammatory changes. Spleen: Normal in size without focal abnormality. Adrenals/Urinary Tract: Adrenal glands are unremarkable. Kidneys are normal, without renal calculi, focal lesion, or hydronephrosis. Bladder is unremarkable. Stomach/Bowel: Bowel shows no evidence of obstruction, ileus, inflammation or visible lesion. No free air identified.  Lymphatic: No enlarged abdominal or pelvic lymph nodes. Reproductive: Prostate is unremarkable. Other: Tiny amount of free fluid in the dependent pelvis. No significant ascites or focal abscess. No hernias. Musculoskeletal: No acute or significant osseous findings. IMPRESSION: 1. Infrarenal abdominal aortic aneurysm measures 4.8 x 4.9 cm in greatest transverse dimensions. Aneurysm continues to the aortic bifurcation. No evidence of aneurysm rupture or aortic dissection. 2. Bilateral common iliac arteries demonstrate irregular and diffuse dilatation with the right reaching maximal caliber of 20 mm in its  proximal segment and the left reaching maximal caliber of approximately 18 mm in its mid segment. 3. Mildly dilated right common femoral artery measuring up to 12 mm with 50-60% stenosis in its distal portion. 4. Mild dilatation of the left common femoral artery with maximum caliber of 11 mm. 5. Intrathoracic origin of the celiac axis just above the diaphragmatic hiatus. Electronically Signed   By: Aletta Edouard M.D.   On: 09/12/2020 15:49    Assessment/Plan 1. AAA (abdominal aortic aneurysm) without rupture ([place]) Recommend: The aneurysm is > 5 cm and therefore should undergo repair. Patient is status post CT scan of the abdominal aorta. The patient is a candidate for endovascular repair.   He will require cardiac clearance.   The patient will continue antiplatelet therapy as prescribed (since the patient is undergoing endovascular repair as opposed to open repair) as well as aggressive management of hyperlipidemia. Exercise is again strongly encouraged.   The patient is reminded that lifetime routine surveillance is a necessity with an endograft.   The risks and benefits of AAA repair are reviewed with the patient.  All questions are answered.  Alternative therapies are also discussed.  The patient agrees to proceed with endovascular aneurysm repair.  Patient will follow-up with me in the office after the surgery.  2. Essential hypertension Continue antihypertensive medications as already ordered, these medications have been reviewed and there are no changes at this time.   3. Chronic obstructive pulmonary disease, unspecified COPD type ([place]) Continue pulmonary medications and aerosols as already ordered, these medications have been reviewed and there are no changes at this time.    4. Gastroesophageal reflux disease, unspecified whether esophagitis present Continue PPI as already ordered, this medication has been reviewed and there are no changes at this time.  Avoidence of caffeine  and alcohol  Moderate elevation of the head of the bed    Hortencia Pilar, MD  09/14/2020 4:04 PM

## 2020-09-14 NOTE — Progress Notes (Signed)
MRN : [id number]  Ronnie Ingram is a 65 y.o. (1955/12/03) male who presents with chief complaint of No chief complaint on file. Marland Kitchen  History of Present Illness:   The patient is seen for follow up evaluation of AAA status post CTA. There were no problems or complications related to the CT scan. The patient denies interval development of abdominal or back pain. No new lower extremity pain or discoloration of the toes.   The patient has a history of coronary artery disease, no recent episodes of angina or shortness of breath. The patient denies interval anaurosis fugax. There is o recent history of TIA symptoms or focal motor deficits. The patient denies PAD or claudication symptoms. There is a history of hyperlipidemia which is being treated with a statin.   CT angiography of the abdomen and pelvis shows an infrarenal AAA 5.0 cm.  No outpatient medications have been marked as taking for the 09/17/20 encounter (Appointment) with Delana Meyer, Dolores Lory, MD.    Past Medical History:  Diagnosis Date  . Arthritis   . Asthma   . Brain bleed ([place])    [place] years ago after falling off ladder  . COPD (chronic obstructive pulmonary disease) ([place])   . Diverticulitis   . Hypertension   . 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 APPENDECTOMY    . FRACTURE SURGERY Left    ORIF left forearm  . HERNIA REPAIR Left    inguinal  . HERNIA REPAIR     abd  . INGUINAL HERNIA REPAIR Right 09/06/2018   Procedure: HERNIA REPAIR INGUINAL ADULT, RIGHT;  Surgeon: Herbert Pun, MD;  Location: [facility] ORS;  Service: General;  Laterality: Right;  . PORTA CATH INSERTION N/A 03/14/2020   Procedure: PORTA CATH INSERTION;  Surgeon: Algernon Huxley, MD;  Location: [place] [facility];  Service: Cardiovascular;  Laterality: N/A;    Social History Social History   Tobacco Use  . Smoking status: Current Every Day Smoker    Packs/day: 0.50    Years: 30.00    Pack years:  15.00    Types: Cigarettes  . Smokeless tobacco: Never Used  Vaping Use  . Vaping Use: Never used  Substance Use Topics  . Alcohol use: Not Currently    Alcohol/week: 0.0 standard drinks    Comment: not drank anything from 3 weeks from 04/30/2020  . Drug use: Yes    Types: Marijuana    Family History No family history on file.  No Known Allergies   REVIEW OF SYSTEMS (Negative unless checked)  Constitutional: [] Weight loss  [] Fever  [] Chills Cardiac: [] Chest pain   [] Chest pressure   [] Palpitations   [] Shortness of breath when laying flat   [] Shortness of breath with exertion. Vascular:  [] Pain in legs with walking   [] Pain in legs at rest  [] History of DVT   [] Phlebitis   [] Swelling in legs   [] Varicose veins   [] Non-healing ulcers Pulmonary:   [] Uses home oxygen   [] Productive cough   [] Hemoptysis   [] Wheeze  [] COPD   [] Asthma Neurologic:  [] Dizziness   [] Seizures   [] History of stroke   [] History of TIA  [] Aphasia   [] Vissual changes   [] Weakness or numbness in arm   [] Weakness or numbness in leg Musculoskeletal:   [] Joint swelling   [] Joint pain   [] Low back pain Hematologic:  [] Easy bruising  [] Easy bleeding   [] Hypercoagulable state   [] Anemic Gastrointestinal:  []   Diarrhea   [] Vomiting  [x] Gastroesophageal reflux/heartburn   [] Difficulty swallowing. Genitourinary:  [] Chronic kidney disease   [] Difficult urination  [] Frequent urination   [] Blood in urine Skin:  [] Rashes   [] Ulcers  Psychological:  [] History of anxiety   []  History of major depression.  Physical Examination  There were no vitals filed for this visit. There is no height or weight on file to calculate BMI. Gen: WD/WN, NAD Head: [place]/AT, No temporalis wasting.  Ear/Nose/Throat: Hearing grossly intact, nares w/o erythema or drainage Eyes: PER, EOMI, sclera nonicteric.  Neck: Supple, no large masses.   Pulmonary:  Good air movement, no audible wheezing bilaterally, no use of accessory muscles.  Cardiac: RRR, no  JVD Vascular:  Vessel Right Left  Radial Palpable Palpable  Gastrointestinal: Non-distended. No guarding/no peritoneal signs.  Musculoskeletal: M/S 5/5 throughout.  No deformity or atrophy.  Neurologic: CN 2-12 intact. Symmetrical.  Speech is fluent. Motor exam as listed above. Psychiatric: Judgment intact, Mood & affect appropriate for pt's clinical situation. Dermatologic: No rashes or ulcers noted.  No changes consistent with cellulitis. enification or skin changes of chronic lymphedema.  CBC Lab Results  Component Value Date   WBC 6.3 05/21/2020   HGB 12.5 (L) 05/21/2020   HCT 35.2 (L) 05/21/2020   MCV 101.1 (H) 05/21/2020   PLT 392 05/21/2020    BMET    Component Value Date/Time   NA 135 05/21/2020 1128   NA 136 11/09/2012 2115   K 3.6 05/21/2020 1128   K 3.8 11/09/2012 2115   CL 96 (L) 05/21/2020 1128   CL 103 11/09/2012 2115   CO2 28 05/21/2020 1128   CO2 25 11/09/2012 2115   GLUCOSE 179 (H) 05/21/2020 1128   GLUCOSE 112 (H) 11/09/2012 2115   BUN 18 05/21/2020 1128   BUN 16 11/09/2012 2115   CREATININE 0.80 09/12/2020 0931   CREATININE 0.92 11/09/2012 2115   CALCIUM 9.0 05/21/2020 1128   CALCIUM 9.1 11/09/2012 2115   GFRNONAA >60 05/21/2020 1128   GFRNONAA >60 11/09/2012 2115   GFRAA >60 03/26/2020 0815   GFRAA >60 11/09/2012 2115   CrCl cannot be calculated (Unknown ideal weight.).  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 EXAM: NUCLEAR MEDICINE PET SKULL BASE TO THIGH TECHNIQUE: 8.0 mCi F-18 FDG was injected intravenously. Full-ring PET imaging was performed from the skull base to thigh after the radiotracer. CT data was obtained and used for attenuation correction and anatomic localization. Fasting blood glucose: 105 mg/dl COMPARISON:  02/29/2020 FINDINGS: Mediastinal blood pool  activity: SUV max 1.8 Liver activity: SUV max 2.9 NECK: Stable hypointensity inferiorly in the right frontal lobe with hypodensity in this region on the CT data compatible with encephalomalacia. This is a chronic finding. At the site of the previously hypermetabolic left tonsillar mass, there is very minimal residual soft tissue fullness for example on image 37 of series 3, but with substantially reduced activity. Maximum SUV in this vicinity is 2.5 (previously 8.8) with contralateral normal side having a maximum SUV of 1.9. There was previously a left station 2 lymph node along the posterior margin of the left submandibular gland which is no longer readily measurable, and which is without hypermetabolic activity (previous maximum SUV 4.2). No new hypermetabolic lesions in the neck identified. Incidental CT findings: Bilateral common carotid artery atherosclerotic calcification. CHEST: No significant  abnormal hypermetabolic activity in this region. Incidental CT findings: Right Port-A-Cath tip: SVC. Coronary, aortic arch, and branch vessel atherosclerotic vascular disease. Airway thickening is present, suggesting bronchitis or reactive airways disease. ABDOMEN/PELVIS: No significant abnormal hypermetabolic activity in this region. Incidental CT findings: Fusiform infrarenal abdominal aortic aneurysm 5.2 cm in transverse diameter on image 184 of series 3, previously 4.7 cm by my measurements. SKELETON: No significant abnormal hypermetabolic activity in this region. Incidental CT findings: Shrapnel along the left hip. Thoracic kyphosis. IMPRESSION: 1. Resolution of prior left cervical adenopathy. Marked reduction in size and activity of the left tonsillar mass. No new active malignancy identified. 2. Infrarenal abdominal aortic aneurysm 5.2 cm in transverse dimension, previously 4.7 cm on 02/29/2020. Recommend referral to a vascular specialist. This recommendation follows ACR consensus guidelines: White Paper of the  ACR Incidental Findings Committee II on Vascular Findings. J Am Coll Radiol 2013; 10:789-794. 3. Other imaging findings of potential clinical significance: Chronic right inferior frontal lobe encephalomalacia. Aortic Atherosclerosis (ICD10-I70.0). Coronary atherosclerosis. Airway thickening is present, suggesting bronchitis or reactive airways disease. Electronically Signed   By: Van Clines M.D.   On: 08/22/2020 08:03   CT Angio Abd/Pel w/ and/or w/o  Result Date: 09/12/2020 CLINICAL DATA:  Roughly 5 cm abdominal aortic aneurysm detected by PET scan. Further evaluation by CTA prior to planned endovascular repair. EXAM: CT ANGIOGRAPHY ABDOMEN AND PELVIS WITH CONTRAST TECHNIQUE: Multidetector CT imaging of the abdomen and pelvis was performed using the standard protocol during bolus administration of intravenous contrast. Multiplanar reconstructed images and MIPs were obtained and reviewed to evaluate the vascular anatomy. CONTRAST:  175mL OMNIPAQUE IOHEXOL 350 MG/ML SOLN COMPARISON:  PET scan on 08/21/2020 FINDINGS: VASCULAR Aorta: Infrarenal abdominal aortic aneurysm measures approximately 4.8 x 4.9 cm in greatest transverse dimensions. Aneurysm continues to the aortic bifurcation. No significant mural thrombus. No evidence of aneurysm rupture or aortic dissection. Infrarenal aneurysm neck measures 1.7-2.0 cm. Celiac: Normally patent. Celiac origin is intrathoracic and just above the diaphragmatic hiatus. No significant celiac compression by the diaphragm or arcuate ligament. Normal branch vessel anatomy and patency. SMA: Normally patent. Renals: Single right renal artery and 2 separate left renal arteries demonstrate no significant stenosis. IMA: The IMA remains patent. Inflow: Bilateral common iliac arteries demonstrate irregular and diffuse dilatation with the right common iliac artery reaching maximal caliber approximately 20 mm in its proximal segment and the left common iliac artery reaching  maximal caliber of approximately 18 mm in its mid segment. Bilateral internal and external iliac arteries demonstrate scattered calcified plaque without evidence of significant obstructive or aneurysmal disease. Proximal Outflow: Mildly dilated right common femoral artery measures up to 12 mm in diameter. Calcified plaque in its distal portion causes approximately 50-60% stenosis just above the femoral bifurcation. The left common femoral artery does not demonstrate significant stenosis and is mildly dilated with maximum caliber of 11 mm. Veins: Delayed imaging through the level of the upper abdomen to just below the kidneys demonstrates no venous abnormalities. Review of the MIP images confirms the above findings. NON-VASCULAR Lower chest: Mild scarring at the right lateral lung base. Hepatobiliary: No focal liver abnormality is seen. No gallstones, gallbladder wall thickening, or biliary dilatation. Pancreas: Unremarkable. No pancreatic ductal dilatation or surrounding inflammatory changes. Spleen: Normal in size without focal abnormality. Adrenals/Urinary Tract: Adrenal glands are unremarkable. Kidneys are normal, without renal calculi, focal lesion, or hydronephrosis. Bladder is unremarkable. Stomach/Bowel: Bowel shows no evidence of obstruction, ileus, inflammation or visible lesion. No free air identified.  Lymphatic: No enlarged abdominal or pelvic lymph nodes. Reproductive: Prostate is unremarkable. Other: Tiny amount of free fluid in the dependent pelvis. No significant ascites or focal abscess. No hernias. Musculoskeletal: No acute or significant osseous findings. IMPRESSION: 1. Infrarenal abdominal aortic aneurysm measures 4.8 x 4.9 cm in greatest transverse dimensions. Aneurysm continues to the aortic bifurcation. No evidence of aneurysm rupture or aortic dissection. 2. Bilateral common iliac arteries demonstrate irregular and diffuse dilatation with the right reaching maximal caliber of 20 mm in its  proximal segment and the left reaching maximal caliber of approximately 18 mm in its mid segment. 3. Mildly dilated right common femoral artery measuring up to 12 mm with 50-60% stenosis in its distal portion. 4. Mild dilatation of the left common femoral artery with maximum caliber of 11 mm. 5. Intrathoracic origin of the celiac axis just above the diaphragmatic hiatus. Electronically Signed   By: Aletta Edouard M.D.   On: 09/12/2020 15:49    Assessment/Plan 1. AAA (abdominal aortic aneurysm) without rupture ([place]) Recommend: The aneurysm is > 5 cm and therefore should undergo repair. Patient is status post CT scan of the abdominal aorta. The patient is a candidate for endovascular repair.   He will require cardiac clearance.   The patient will continue antiplatelet therapy as prescribed (since the patient is undergoing endovascular repair as opposed to open repair) as well as aggressive management of hyperlipidemia. Exercise is again strongly encouraged.   The patient is reminded that lifetime routine surveillance is a necessity with an endograft.   The risks and benefits of AAA repair are reviewed with the patient.  All questions are answered.  Alternative therapies are also discussed.  The patient agrees to proceed with endovascular aneurysm repair.  Patient will follow-up with me in the office after the surgery.  2. Essential hypertension Continue antihypertensive medications as already ordered, these medications have been reviewed and there are no changes at this time.   3. Chronic obstructive pulmonary disease, unspecified COPD type ([place]) Continue pulmonary medications and aerosols as already ordered, these medications have been reviewed and there are no changes at this time.    4. Gastroesophageal reflux disease, unspecified whether esophagitis present Continue PPI as already ordered, this medication has been reviewed and there are no changes at this time.  Avoidence of caffeine  and alcohol  Moderate elevation of the head of the bed    Ronnie Pilar, MD  09/14/2020 4:04 PM

## 2020-09-17 ENCOUNTER — Other Ambulatory Visit: Payer: Self-pay

## 2020-09-17 ENCOUNTER — Telehealth: Payer: Self-pay | Admitting: Cardiovascular Disease

## 2020-09-17 ENCOUNTER — Encounter (INDEPENDENT_AMBULATORY_CARE_PROVIDER_SITE_OTHER): Payer: Self-pay | Admitting: Vascular Surgery

## 2020-09-17 ENCOUNTER — Ambulatory Visit (INDEPENDENT_AMBULATORY_CARE_PROVIDER_SITE_OTHER): Payer: Medicare Other | Admitting: Vascular Surgery

## 2020-09-17 VITALS — BP 123/73 | HR 68 | Ht 70.0 in | Wt 142.0 lb

## 2020-09-17 DIAGNOSIS — K219 Gastro-esophageal reflux disease without esophagitis: Secondary | ICD-10-CM | POA: Diagnosis not present

## 2020-09-17 DIAGNOSIS — I10 Essential (primary) hypertension: Secondary | ICD-10-CM | POA: Diagnosis not present

## 2020-09-17 DIAGNOSIS — I714 Abdominal aortic aneurysm, without rupture, unspecified: Secondary | ICD-10-CM

## 2020-09-17 DIAGNOSIS — J449 Chronic obstructive pulmonary disease, unspecified: Secondary | ICD-10-CM

## 2020-09-17 NOTE — Telephone Encounter (Signed)
This encounter was created in error - please disregard.

## 2020-09-17 NOTE — Telephone Encounter (Signed)
Left voicemail for patient to call back and schedule cardiolog referral placed by Dr. Delana Meyer. Held appointment for 4/4 at 2pm on Dr. Donivan Scull schedule.

## 2020-09-20 ENCOUNTER — Other Ambulatory Visit: Payer: Medicare Other

## 2020-09-20 ENCOUNTER — Ambulatory Visit: Payer: Medicare Other | Admitting: Oncology

## 2020-09-23 ENCOUNTER — Encounter (INDEPENDENT_AMBULATORY_CARE_PROVIDER_SITE_OTHER): Payer: Self-pay | Admitting: Vascular Surgery

## 2020-09-24 ENCOUNTER — Other Ambulatory Visit: Payer: Self-pay

## 2020-09-24 ENCOUNTER — Ambulatory Visit (INDEPENDENT_AMBULATORY_CARE_PROVIDER_SITE_OTHER): Payer: Medicare Other | Admitting: Cardiovascular Disease

## 2020-09-24 ENCOUNTER — Encounter: Payer: Self-pay | Admitting: Cardiovascular Disease

## 2020-09-24 VITALS — BP 130/80 | HR 61 | Ht 70.0 in | Wt 142.5 lb

## 2020-09-24 DIAGNOSIS — F17200 Nicotine dependence, unspecified, uncomplicated: Secondary | ICD-10-CM | POA: Diagnosis not present

## 2020-09-24 DIAGNOSIS — I10 Essential (primary) hypertension: Secondary | ICD-10-CM

## 2020-09-24 DIAGNOSIS — I714 Abdominal aortic aneurysm, without rupture, unspecified: Secondary | ICD-10-CM

## 2020-09-24 DIAGNOSIS — J449 Chronic obstructive pulmonary disease, unspecified: Secondary | ICD-10-CM | POA: Diagnosis not present

## 2020-09-24 DIAGNOSIS — I25118 Atherosclerotic heart disease of native coronary artery with other forms of angina pectoris: Secondary | ICD-10-CM | POA: Diagnosis not present

## 2020-09-24 NOTE — Progress Notes (Signed)
Cardiology Office Note  Date:  09/24/2020   ID:  Ronnie Ingram, DOB 1955-11-24, MRN [id number]  PCP:  Kerri Perches, PA-C   Chief Complaint  Patient presents with  . New Patient (Initial Visit)    Ref by Dr. Delana Meyer for evaluation of AAA status post CTA. Medications reviewed by the patient verbally. "doing well."     HPI:  [name] is a 65 year old gentleman with past medical history of AAA, followed by Dr. Ronalee Belts, 5 cm Hyperlipidemia Hypertension Smoker half pack per day 30 years COPD GERD Head neck cancer, squamous cell carcinoma oropharynx Who presents by referral from Dr. Ronalee Belts for consultation of hisAAA, preop evaluation prior to endograft repair  CT angiography of the abdomen and pelvis shows an infrarenal AAA 5.0 cm. Images pulled up and reviewed Plan is for endovascular repair with Dr. Ronalee Belts  Reports he was recently at the beach, fishing  able to walk 1 mile to get to the beach to the sand, Able to climb 3 flights of stairs to get in department Denies any chest pain concerning for angina Periodically has to stop for some mild shortness of breath after walking long distance but otherwise denies any symptoms Denies any chest pain concerning for angina  Prior CT chest May 2021 Images pulled up and reviewed, coronary calcification noted in the proximal LAD Minimal aortic atherosclerosis noted  No prior lipid panel available Requested from primary care  Discussed prior treatment of the squamous cell carcinoma oropharynx radiation chemotherapy, has chronic dry mouth  EKG personally reviewed by myself on todays visit Normal sinus rhythm rate 61 bpm no significant ST or T wave changes   PMH:   has a past medical history of Arthritis, Asthma, Brain bleed ([place]), COPD (chronic obstructive pulmonary disease) ([place]), Diverticulitis, Hypertension, and Tonsil cancer ([place]).  PSH:    Past Surgical History:  Procedure Laterality Date  . APPENDECTOMY    . FRACTURE  SURGERY Left    ORIF left forearm  . HERNIA REPAIR Left    inguinal  . HERNIA REPAIR     abd  . INGUINAL HERNIA REPAIR Right 09/06/2018   Procedure: HERNIA REPAIR INGUINAL ADULT, RIGHT;  Surgeon: Herbert Pun, MD;  Location: [facility] ORS;  Service: General;  Laterality: Right;  . PORTA CATH INSERTION N/A 03/14/2020   Procedure: PORTA CATH INSERTION;  Surgeon: Algernon Huxley, MD;  Location: [place] [facility];  Service: Cardiovascular;  Laterality: N/A;    Current Outpatient Medications  Medication Sig Dispense Refill  . gabapentin (NEURONTIN) 300 MG capsule Take 300 mg by mouth 2 (two) times daily as needed (pain).    . montelukast (SINGULAIR) 10 MG tablet Take 10 mg by mouth.    Marland Kitchen omeprazole (PRILOSEC) 40 MG capsule Take 1 capsule (40 mg total) by mouth daily. 30 capsule 0  . tiotropium (SPIRIVA) 18 MCG inhalation capsule Place 18 mcg into inhaler and inhale daily.     No current facility-administered medications for this visit.   Facility-Administered Medications Ordered in Other Visits  Medication Dose Route Frequency Provider Last Rate Last Admin  . sodium chloride flush (NS) 0.9 % injection 10 mL  10 mL Intravenous PRN Lloyd Huger, MD   10 mL at 04/23/20 0850     Allergies:   Patient has no known allergies.   Social History:  The patient  reports that he has been smoking cigarettes. He has a 30.00 pack-year smoking history. He has never used smokeless tobacco. He reports previous  alcohol use. He reports current drug use. Drug: Marijuana.   Family History:   family history includes AAA (abdominal aortic aneurysm) in his brother and father; Aneurysm in his father; Hyperlipidemia in his father; Hypertension in his father; Lung cancer in his mother.    Review of Systems: Review of Systems  Constitutional: Negative.   Respiratory: Negative.   Cardiovascular: Negative.   Gastrointestinal: Negative.   Musculoskeletal: Negative.   Neurological: Negative.    Psychiatric/Behavioral: Negative.   All other systems reviewed and are negative.   PHYSICAL EXAM: VS:  BP 130/80 (BP Location: Right Arm, Patient Position: Sitting, Cuff Size: Normal)   Pulse 61   Ht 5\' 10"  (1.778 m)   Wt 142 lb 8 oz (64.6 kg)   SpO2 97%   BMI 20.45 kg/m  , BMI Body mass index is 20.45 kg/m. GEN: Well nourished, well developed, in no acute distress HEENT: normal Neck: no JVD, carotid bruits, or masses Cardiac: RRR; no murmurs, rubs, or gallops,no edema  Respiratory:  clear to auscultation bilaterally, normal work of breathing GI: soft, nontender, nondistended, + BS MS: no deformity or atrophy Skin: warm and dry, no rash Neuro:  Strength and sensation are intact Psych: euthymic mood, full affect   Recent Labs: 05/21/2020: ALT 19; BUN 18; Hemoglobin 12.5; Magnesium 1.8; Platelets 392; Potassium 3.6; Sodium 135 09/12/2020: Creatinine, Ser 0.80    Lipid Panel No results found for: CHOL, HDL, LDLCALC, TRIG    Wt Readings from Last 3 Encounters:  09/24/20 142 lb 8 oz (64.6 kg)  09/17/20 142 lb (64.4 kg)  08/27/20 139 lb (63 kg)      ASSESSMENT AND PLAN:  Problem List Items Addressed This Visit      Cardiology Problems   AAA (abdominal aortic aneurysm) without rupture ([place]) - Primary   Relevant Orders   EKG 12-Lead   Essential hypertension     Other   COPD (chronic obstructive pulmonary disease) ([place])   Relevant Orders   EKG 12-Lead   Smoker    Other Visit Diagnoses    Coronary artery disease of native artery of native heart with stable angina pectoris (HCC)         AAA  acceptable risk for endograft procedure Denies anginal symptoms, reasonably good exercise tolerance, able to climb several flights of stairs without anginal symptoms Walking 1 mile to the beach to the stand with no significant angina -He is eager to have AAA fixed given strong family history  COPD We have encouraged her to continue to work on weaning her cigarettes and  smoking cessation. She will continue to work on this and does not want any assistance with chantix.   Hypertension Blood pressure is well controlled on today's visit. No changes made to the medications.  Coronary calcification Noted on CT scan chest, notably in the proximal LAD, little bit left circumflex stent for CAD Minimal aortic atherosclerosis He denies significant anginal symptoms Discussed if he has any worsening shortness of breath or chest tightness concerning for angina we will order a pharmacologic Myoview -Lipid panel has been requested from primary care   Total encounter time more than 60 minutes  Greater than 50% was spent in counseling and coordination of care with the patient    Signed, Esmond Plants, M.D., Ph.D. [place], [place]

## 2020-09-24 NOTE — Patient Instructions (Addendum)
Labs from PMD, lipids  Call if you would like any testing after the procedure   Medication Instructions:  No changes  If you need a refill on your cardiac medications before your next appointment, please call your pharmacy.    Lab work: No new labs needed   If you have labs (blood work) drawn today and your tests are completely normal, you will receive your results only by: Marland Kitchen MyChart Message (if you have MyChart) OR . A paper copy in the mail If you have any lab test that is abnormal or we need to change your treatment, we will call you to review the results.   Testing/Procedures: No new testing needed   Follow-Up: At [facility], you and your health needs are our priority.  As part of our continuing mission to provide you with exceptional heart care, we have created designated Provider Care Teams.  These Care Teams include your primary Cardiologist (physician) and Advanced Practice Providers (APPs -  Physician Assistants and Nurse Practitioners) who all work together to provide you with the care you need, when you need it.  . You will need a follow up appointment in 12 months  . Providers on your designated Care Team:   . Murray Hodgkins, NP . Christell Faith, PA-C . Marrianne Mood, PA-C  Any Other Special Instructions Will Be Listed Below (If Applicable).  COVID-19 Vaccine Information can be found at: ShippingScam.co.uk For questions related to vaccine distribution or appointments, please email vaccine@[email or web address] .com or call [phone].

## 2020-09-27 ENCOUNTER — Other Ambulatory Visit: Payer: Self-pay

## 2020-09-27 ENCOUNTER — Inpatient Hospital Stay (HOSPITAL_BASED_OUTPATIENT_CLINIC_OR_DEPARTMENT_OTHER): Payer: Medicare Other | Admitting: Oncology

## 2020-09-27 ENCOUNTER — Inpatient Hospital Stay: Payer: Medicare Other | Attending: Oncology

## 2020-09-27 ENCOUNTER — Encounter: Payer: Self-pay | Admitting: Oncology

## 2020-09-27 VITALS — BP 146/94 | HR 66 | Temp 98.0°F | Wt 142.0 lb

## 2020-09-27 DIAGNOSIS — I25118 Atherosclerotic heart disease of native coronary artery with other forms of angina pectoris: Secondary | ICD-10-CM | POA: Diagnosis not present

## 2020-09-27 DIAGNOSIS — Z9221 Personal history of antineoplastic chemotherapy: Secondary | ICD-10-CM | POA: Diagnosis not present

## 2020-09-27 DIAGNOSIS — Z79899 Other long term (current) drug therapy: Secondary | ICD-10-CM | POA: Insufficient documentation

## 2020-09-27 DIAGNOSIS — C109 Malignant neoplasm of oropharynx, unspecified: Secondary | ICD-10-CM | POA: Diagnosis not present

## 2020-09-27 DIAGNOSIS — Z923 Personal history of irradiation: Secondary | ICD-10-CM | POA: Insufficient documentation

## 2020-09-27 DIAGNOSIS — Z801 Family history of malignant neoplasm of trachea, bronchus and lung: Secondary | ICD-10-CM | POA: Insufficient documentation

## 2020-09-27 DIAGNOSIS — I714 Abdominal aortic aneurysm, without rupture: Secondary | ICD-10-CM | POA: Diagnosis not present

## 2020-09-27 DIAGNOSIS — F17210 Nicotine dependence, cigarettes, uncomplicated: Secondary | ICD-10-CM | POA: Insufficient documentation

## 2020-09-27 DIAGNOSIS — I10 Essential (primary) hypertension: Secondary | ICD-10-CM | POA: Insufficient documentation

## 2020-09-27 DIAGNOSIS — Z85818 Personal history of malignant neoplasm of other sites of lip, oral cavity, and pharynx: Secondary | ICD-10-CM | POA: Diagnosis not present

## 2020-09-27 LAB — CBC WITH DIFFERENTIAL/PLATELET
Abs Immature Granulocytes: 0.02 10*3/uL (ref 0.00–0.07)
Basophils Absolute: 0 10*3/uL (ref 0.0–0.1)
Basophils Relative: 1 %
Eosinophils Absolute: 0.1 10*3/uL (ref 0.0–0.5)
Eosinophils Relative: 1 %
HCT: 40.4 % (ref 39.0–52.0)
Hemoglobin: 14.1 g/dL (ref 13.0–17.0)
Immature Granulocytes: 0 %
Lymphocytes Relative: 18 %
Lymphs Abs: 1.1 10*3/uL (ref 0.7–4.0)
MCH: 35.5 pg — ABNORMAL HIGH (ref 26.0–34.0)
MCHC: 34.9 g/dL (ref 30.0–36.0)
MCV: 101.8 fL — ABNORMAL HIGH (ref 80.0–100.0)
Monocytes Absolute: 0.7 10*3/uL (ref 0.1–1.0)
Monocytes Relative: 11 %
Neutro Abs: 4 10*3/uL (ref 1.7–7.7)
Neutrophils Relative %: 69 %
Platelets: 309 10*3/uL (ref 150–400)
RBC: 3.97 MIL/uL — ABNORMAL LOW (ref 4.22–5.81)
RDW: 12.2 % (ref 11.5–15.5)
WBC: 5.9 10*3/uL (ref 4.0–10.5)
nRBC: 0 % (ref 0.0–0.2)

## 2020-09-27 LAB — COMPREHENSIVE METABOLIC PANEL
ALT: 66 U/L — ABNORMAL HIGH (ref 0–44)
AST: 75 U/L — ABNORMAL HIGH (ref 15–41)
Albumin: 3.7 g/dL (ref 3.5–5.0)
Alkaline Phosphatase: 76 U/L (ref 38–126)
Anion gap: 8 (ref 5–15)
BUN: 13 mg/dL (ref 8–23)
CO2: 27 mmol/L (ref 22–32)
Calcium: 8.9 mg/dL (ref 8.9–10.3)
Chloride: 102 mmol/L (ref 98–111)
Creatinine, Ser: 0.74 mg/dL (ref 0.61–1.24)
GFR, Estimated: >60 mL/min (ref >60)
Glucose, Bld: 117 mg/dL — ABNORMAL HIGH (ref 70–99)
Potassium: 3.6 mmol/L (ref 3.5–5.1)
Sodium: 137 mmol/L (ref 135–145)
Total Bilirubin: 0.5 mg/dL (ref 0.3–1.2)
Total Protein: 7.3 g/dL (ref 6.5–8.1)

## 2020-09-27 LAB — PHOSPHORUS: Phosphorus: 3.4 mg/dL (ref 2.5–4.6)

## 2020-09-27 MED ORDER — HEPARIN SOD (PORK) LOCK FLUSH 100 UNIT/ML IV SOLN
INTRAVENOUS | Status: AC
  Filled 2020-09-27: qty 5

## 2020-09-27 MED ORDER — HEPARIN SOD (PORK) LOCK FLUSH 100 UNIT/ML IV SOLN
500.0000 [IU] | Freq: Once | INTRAVENOUS | Status: AC
  Administered 2020-09-27: 500 [IU] via INTRAVENOUS
  Filled 2020-09-27: qty 5

## 2020-09-28 ENCOUNTER — Encounter: Payer: Self-pay | Admitting: Cardiovascular Disease

## 2020-10-01 ENCOUNTER — Other Ambulatory Visit: Payer: Self-pay | Admitting: *Deleted

## 2020-10-01 DIAGNOSIS — C109 Malignant neoplasm of oropharynx, unspecified: Secondary | ICD-10-CM

## 2020-10-02 ENCOUNTER — Telehealth (INDEPENDENT_AMBULATORY_CARE_PROVIDER_SITE_OTHER): Payer: Self-pay

## 2020-10-02 NOTE — Telephone Encounter (Signed)
Spoke with Ronnie Ingram for the patient and he is scheduled with Dr. Delana Meyer for a Endovascular AAA stent graft repair on 10/11/20 with a 10:00 am arrival time to the [facility]. Pre-op phone on 10/05/20 between 1-5 pm and covid testing on 10/09/20 between 8-2 pm at the [place]. Pre-surgical instructions were discussed and will be mailed.

## 2020-10-03 ENCOUNTER — Other Ambulatory Visit (INDEPENDENT_AMBULATORY_CARE_PROVIDER_SITE_OTHER): Payer: Self-pay | Admitting: Nurse Practitioner

## 2020-10-03 NOTE — Progress Notes (Signed)
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 CT soft tissue neck showed a hyperenhancement of left palatine tonsil measuring 2.8 cm. Enlarged left level 2A lymph node measuring 3.4 cm in long axis and a smaller but conspicuous level 2B lymph node measuring 9 mm. PET CT scan showed marked hypermetabolism in the left tonsillar region with an SUV of 8.8. SUV 4.2 at the level 2 lymph node. The smaller level 2B lymph node is superimposed on the dominant necrotic node. 4.8 x 4.4 cm abdominal aortic aneurysm. 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 Baruch Gouty and plan is for concurrent chemoradiation.  Treatment interrupted by worsening mucositis requiring hospitalization and feeding tube placement.Patient has completed 6 weekly cycles of cisplatin chemotherapy and concurrent chemoradiation in November 2021  PET scan done in March 2022 showed resolution of left cervical adenopathy and reduction in the size and activity of the tonsillar mass.  Infrarenal abdominal aortic aneurysm 5.2 cm in transverse dimension for which patient was referred to vascular surgery.  Interval history-patient reports he is eating and drinking well and has no throat pain or dysphagia.  Food still does not taste good and he complains of dry  mouth.  ECOG PS- 1 Pain scale- 0  Review of systems- Review of Systems  Constitutional: Positive for malaise/fatigue. Negative for chills, fever and weight loss.  HENT: Negative for congestion, ear discharge and nosebleeds.   Eyes: Negative for blurred vision.  Respiratory: Negative for cough, hemoptysis, sputum production, shortness of breath and wheezing.   Cardiovascular: Negative for chest pain, palpitations, orthopnea and claudication.  Gastrointestinal: Negative for abdominal pain, blood in stool, constipation, diarrhea, heartburn, melena, nausea and vomiting.  Genitourinary: Negative for dysuria, flank pain, frequency, hematuria and urgency.  Musculoskeletal: Negative for back pain, joint pain and myalgias.  Skin: Negative for rash.  Neurological: Negative for dizziness, tingling, focal weakness, seizures, weakness and headaches.  Endo/Heme/Allergies: Does not bruise/bleed easily.  Psychiatric/Behavioral: Negative for depression and suicidal ideas. The patient does not have insomnia.       No Known Allergies   Past Medical History:  Diagnosis Date  . Arthritis   . Asthma   . Brain bleed ([place])    [place] years ago after falling off ladder  . COPD (chronic obstructive pulmonary disease) ([place])   . Diverticulitis   . Hypertension   . 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 APPENDECTOMY    . FRACTURE SURGERY Left    ORIF left forearm  . HERNIA REPAIR Left    inguinal  . HERNIA REPAIR     abd  . INGUINAL HERNIA REPAIR Right 09/06/2018   Procedure: HERNIA REPAIR INGUINAL ADULT, RIGHT;  Surgeon: Herbert Pun, MD;  Location: [facility] ORS;  Service: General;  Laterality: Right;  . PORTA CATH INSERTION N/A 03/14/2020   Procedure: PORTA CATH INSERTION;  Surgeon: Leotis Pain  S, MD;  Location: [place] [facility];  Service: Cardiovascular;  Laterality: N/A;    Social History   Socioeconomic History  . Marital status: Significant  Other    Spouse name: Not on file  . Number of children: Not on file  . Years of education: Not on file  . Highest education level: Not on file  Occupational History  . Not on file  Tobacco Use  . Smoking status: Current Every Day Smoker    Packs/day: 1.00    Years: 30.00    Pack years: 30.00    Types: Cigarettes  . Smokeless tobacco: Never Used  Vaping Use  . Vaping Use: Never used  Substance and Sexual Activity  . Alcohol use: Not Currently    Alcohol/week: 0.0 standard drinks    Comment: not drank anything from 3 weeks from 04/30/2020  . Drug use: Yes    Types: Marijuana  . Sexual activity: Not Currently  Other Topics Concern  . Not on file  Social History Narrative  . Not on file   Social Determinants of Health   Financial Resource Strain: Not on file  Food Insecurity: Not on file  Transportation Needs: Not on file  Physical Activity: Not on file  Stress: Not on file  Social Connections: Not on file  Intimate Partner Violence: Not on file    Family History  Problem Relation Age of Onset  . Lung cancer Mother   . Aneurysm Father   . AAA (abdominal aortic aneurysm) Father   . Hyperlipidemia Father   . Hypertension Father   . AAA (abdominal aortic aneurysm) Brother      Current Outpatient Medications:  .  gabapentin (NEURONTIN) 300 MG capsule, Take 300 mg by mouth daily as needed (pain)., Disp: , Rfl:  .  montelukast (SINGULAIR) 10 MG tablet, Take 10 mg by mouth daily., Disp: , Rfl:  .  omeprazole (PRILOSEC) 40 MG capsule, Take 1 capsule (40 mg total) by mouth daily. (Patient not taking: No sig reported), Disp: 30 capsule, Rfl: 0 .  tiotropium (SPIRIVA) 18 MCG inhalation capsule, Place 18 mcg into inhaler and inhale daily., Disp: , Rfl:  .  cholecalciferol (VITAMIN D3) 25 MCG (1000 UNIT) tablet, Take 1,000 Units by mouth daily., Disp: , Rfl:  .  Omega-3 Fatty Acids (FISH OIL) 1000 MG CAPS, Take 1,000 mg by mouth daily., Disp: , Rfl:  .  omeprazole (PRILOSEC  OTC) 20 MG tablet, Take 20 mg by mouth daily., Disp: , Rfl:  .  vitamin B-12 (CYANOCOBALAMIN) 1000 MCG tablet, Take 1,000 mcg by mouth daily., Disp: , Rfl:  No current facility-administered medications for this visit.  Facility-Administered Medications Ordered in Other Visits:  .  sodium chloride flush (NS) 0.9 % injection 10 mL, 10 mL, Intravenous, PRN, Lloyd Huger, MD, 10 mL at 04/23/20 0850  Physical exam:  Vitals:   09/27/20 1430  BP: (!) 146/94  Pulse: 66  Temp: 98 F (36.7 C)  TempSrc: Tympanic  Weight: 142 lb (64.4 kg)   Physical Exam Constitutional:      General: He is not in acute distress. HENT:     Mouth/Throat:     Mouth: Mucous membranes are moist.     Pharynx: Oropharynx is clear.  Cardiovascular:     Rate and Rhythm: Normal rate and regular rhythm.     Heart sounds: Normal heart sounds.  Pulmonary:     Effort: Pulmonary effort is normal.     Breath sounds: Normal breath  sounds.  Abdominal:     General: Bowel sounds are normal.     Palpations: Abdomen is soft.  Lymphadenopathy:     Comments: No palpable cervical adenopathy  Skin:    General: Skin is warm and dry.  Neurological:     Mental Status: He is alert and oriented to person, place, and time.      CMP Latest Ref Rng & Units 09/27/2020  Glucose 70 - 99 mg/dL 117(H)  BUN 8 - 23 mg/dL 13  Creatinine 0.61 - 1.24 mg/dL 0.74  Sodium 135 - 145 mmol/L 137  Potassium 3.5 - 5.1 mmol/L 3.6  Chloride 98 - 111 mmol/L 102  CO2 22 - 32 mmol/L 27  Calcium 8.9 - 10.3 mg/dL 8.9  Total Protein 6.5 - 8.1 g/dL 7.3  Total Bilirubin 0.3 - 1.2 mg/dL 0.5  Alkaline Phos 38 - 126 U/L 76  AST 15 - 41 U/L 75(H)  ALT 0 - 44 U/L 66(H)   CBC Latest Ref Rng & Units 09/27/2020  WBC 4.0 - 10.5 K/uL 5.9  Hemoglobin 13.0 - 17.0 g/dL 14.1  Hematocrit 39.0 - 52.0 % 40.4  Platelets 150 - 400 K/uL 309    No images are attached to the encounter.  CT Angio Abd/Pel w/ and/or w/o  Result Date: 09/12/2020 CLINICAL DATA:   Roughly 5 cm abdominal aortic aneurysm detected by PET scan. Further evaluation by CTA prior to planned endovascular repair. EXAM: CT ANGIOGRAPHY ABDOMEN AND PELVIS WITH CONTRAST TECHNIQUE: Multidetector CT imaging of the abdomen and pelvis was performed using the standard protocol during bolus administration of intravenous contrast. Multiplanar reconstructed images and MIPs were obtained and reviewed to evaluate the vascular anatomy. CONTRAST:  156mL OMNIPAQUE IOHEXOL 350 MG/ML SOLN COMPARISON:  PET scan on 08/21/2020 FINDINGS: VASCULAR Aorta: Infrarenal abdominal aortic aneurysm measures approximately 4.8 x 4.9 cm in greatest transverse dimensions. Aneurysm continues to the aortic bifurcation. No significant mural thrombus. No evidence of aneurysm rupture or aortic dissection. Infrarenal aneurysm neck measures 1.7-2.0 cm. Celiac: Normally patent. Celiac origin is intrathoracic and just above the diaphragmatic hiatus. No significant celiac compression by the diaphragm or arcuate ligament. Normal branch vessel anatomy and patency. SMA: Normally patent. Renals: Single right renal artery and 2 separate left renal arteries demonstrate no significant stenosis. IMA: The IMA remains patent. Inflow: Bilateral common iliac arteries demonstrate irregular and diffuse dilatation with the right common iliac artery reaching maximal caliber approximately 20 mm in its proximal segment and the left common iliac artery reaching maximal caliber of approximately 18 mm in its mid segment. Bilateral internal and external iliac arteries demonstrate scattered calcified plaque without evidence of significant obstructive or aneurysmal disease. Proximal Outflow: Mildly dilated right common femoral artery measures up to 12 mm in diameter. Calcified plaque in its distal portion causes approximately 50-60% stenosis just above the femoral bifurcation. The left common femoral artery does not demonstrate significant stenosis and is mildly dilated  with maximum caliber of 11 mm. Veins: Delayed imaging through the level of the upper abdomen to just below the kidneys demonstrates no venous abnormalities. Review of the MIP images confirms the above findings. NON-VASCULAR Lower chest: Mild scarring at the right lateral lung base. Hepatobiliary: No focal liver abnormality is seen. No gallstones, gallbladder wall thickening, or biliary dilatation. Pancreas: Unremarkable. No pancreatic ductal dilatation or surrounding inflammatory changes. Spleen: Normal in size without focal abnormality. Adrenals/Urinary Tract: Adrenal glands are unremarkable. Kidneys are normal, without renal calculi, focal lesion, or hydronephrosis. Bladder is unremarkable. Stomach/Bowel:  Bowel shows no evidence of obstruction, ileus, inflammation or visible lesion. No free air identified. Lymphatic: No enlarged abdominal or pelvic lymph nodes. Reproductive: Prostate is unremarkable. Other: Tiny amount of free fluid in the dependent pelvis. No significant ascites or focal abscess. No hernias. Musculoskeletal: No acute or significant osseous findings. IMPRESSION: 1. Infrarenal abdominal aortic aneurysm measures 4.8 x 4.9 cm in greatest transverse dimensions. Aneurysm continues to the aortic bifurcation. No evidence of aneurysm rupture or aortic dissection. 2. Bilateral common iliac arteries demonstrate irregular and diffuse dilatation with the right reaching maximal caliber of 20 mm in its proximal segment and the left reaching maximal caliber of approximately 18 mm in its mid segment. 3. Mildly dilated right common femoral artery measuring up to 12 mm with 50-60% stenosis in its distal portion. 4. Mild dilatation of the left common femoral artery with maximum caliber of 11 mm. 5. Intrathoracic origin of the celiac axis just above the diaphragmatic hiatus. Electronically Signed   By: Aletta Edouard M.D.   On: 09/12/2020 15:49     Assessment and plan- Patient is a 65 y.o. 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  No clinical signs and symptoms of recurrence based on today's exam.  He also continues to follow-up with ENT Dr. Dr. Antonietta Barcelona to get his port taken out at this time  Abdominal aortic aneurysm: Follows up with Dr. Delana Meyer and is going to get an endovascular repair soon.  I will see him back in 6 months with labs  Visit Diagnosis 1. Squamous cell carcinoma of oropharynx ([place])      Dr. Randa Evens, MD, MPH [facility] [phone] 10/03/2020 9:04 PM

## 2020-10-05 ENCOUNTER — Encounter
Admission: RE | Admit: 2020-10-05 | Discharge: 2020-10-05 | Disposition: A | Discharge status: Home / Self Care | Payer: Medicare Other | Source: Ambulatory Visit | Attending: Vascular Surgery | Admitting: Vascular Surgery

## 2020-10-05 ENCOUNTER — Other Ambulatory Visit: Payer: Self-pay

## 2020-10-05 NOTE — Patient Instructions (Addendum)
Your procedure is scheduled on: October 11, 2020  THURSDAY Report to the Registration Desk on the 1st floor of the [place] INSTRUCTED BY VASCULAR LAB.  REMEMBER: Instructions that are not followed completely may result in serious medical risk, up to and including death; or upon the discretion of your surgeon and anesthesiologist your surgery may need to be rescheduled.  Do not eat OR DRINK after midnight the night before surgery.  No gum chewing, lozengers or hard candies.   TAKE THESE MEDICATIONS THE MORNING OF SURGERY WITH A SIP OF WATER: MONTELUKAST OMEPRAZOLE (take one the night before and one on the morning of surgery - helps to prevent nausea after surgery.)  Use inhalers on the day of surgery   One week prior to surgery: Stop Anti-inflammatories (NSAIDS) such as Advil, Aleve, Ibuprofen, Motrin, Naproxen, Naprosyn and ASPIRIN OR  Aspirin based products such as Excedrin, Goodys Powder, BC Powder.  USE TYLENOL ONLY IF NEEDED Stop ANY OVER THE COUNTER supplements until after surgery.  No Alcohol for 24 hours before or after surgery.  No Smoking including e-cigarettes for 24 hours prior to surgery.  No chewable tobacco products for at least 6 hours prior to surgery.  No nicotine patches on the day of surgery.  Do not use any "recreational" drugs for at least a week prior to your surgery.  Please be advised that the combination of cocaine and anesthesia may have negative outcomes, up to and including death. If you test positive for cocaine, your surgery will be cancelled.  On the morning of surgery brush your teeth with toothpaste and water, you may rinse your mouth with mouthwash if you wish. Do not swallow any toothpaste or mouthwash.  Do not wear jewelry, make-up, hairpins, clips or nail polish.  Do not wear lotions, powders, or perfumes OR DEODORANT   Do not shave body from the neck down 48 hours prior to surgery just in case you cut yourself which could leave a  site for infection.  Also, freshly shaved skin may become irritated if using the CHG soap.  Contact lenses, hearing aids and dentures may not be worn into surgery.  Do not bring valuables to the hospital. [facility] is not responsible for any missing/lost belongings or valuables.   Use CHG Soap as directed on instruction sheet.  Notify your doctor if there is any change in your medical condition (cold, fever, infection).  Wear comfortable clothing (specific to your surgery type) to the hospital.  Plan for stool softeners for home use; pain medications have a tendency to cause constipation. You can also help prevent constipation by eating foods high in fiber such as fruits and vegetables and drinking plenty of fluids as your diet allows.  After surgery, you can help prevent lung complications by doing breathing exercises.  Take deep breaths and cough every 1-2 hours. Your doctor may order a device called an Incentive Spirometer to help you take deep breaths. When coughing or sneezing, hold a pillow firmly against your incision with both hands. This is called "splinting." Doing this helps protect your incision. It also decreases belly discomfort.  If you are being admitted to the hospital overnight,YOU [place]    If you are being discharged the day of surgery, you will not be allowed to drive home. You will need a responsible adult (18 years or older) to drive you home and stay with you that night.   Please call  the Pre-admissions Testing Dept. at [phone] if you have any questions about these instructions.  Surgery Visitation Policy:  Patients undergoing a surgery or procedure may have one family member or support person with them as long as that person is not COVID-19 positive or experiencing its symptoms.  That person may remain in the waiting area during the procedure.  Inpatient Visitation:    Visiting hours are 7 a.m. to 8  p.m. Inpatients will be allowed two visitors daily. The visitors may change each day during the patient's stay. No visitors under the age of 45. Any visitor under the age of 49 must be accompanied by an adult. The visitor must pass COVID-19 screenings, use hand sanitizer when entering and exiting the patient's room and wear a mask at all times, including in the patient's room. Patients must also wear a mask when staff or their visitor are in the room. Masking is required regardless of vaccination status.

## 2020-10-08 ENCOUNTER — Encounter: Payer: Self-pay | Admitting: Vascular Surgery

## 2020-10-09 ENCOUNTER — Encounter: Payer: Self-pay | Admitting: Vascular Surgery

## 2020-10-09 ENCOUNTER — Other Ambulatory Visit: Payer: Self-pay

## 2020-10-09 ENCOUNTER — Other Ambulatory Visit
Admission: RE | Admit: 2020-10-09 | Discharge: 2020-10-09 | Disposition: A | Discharge status: Home / Self Care | Payer: Medicare Other | Source: Ambulatory Visit | Attending: Vascular Surgery | Admitting: Vascular Surgery

## 2020-10-09 DIAGNOSIS — Z01812 Encounter for preprocedural laboratory examination: Secondary | ICD-10-CM | POA: Insufficient documentation

## 2020-10-09 DIAGNOSIS — Z20822 Contact with and (suspected) exposure to covid-19: Secondary | ICD-10-CM | POA: Insufficient documentation

## 2020-10-09 LAB — BASIC METABOLIC PANEL
Anion gap: 9 (ref 5–15)
BUN: 12 mg/dL (ref 8–23)
CO2: 28 mmol/L (ref 22–32)
Calcium: 9.3 mg/dL (ref 8.9–10.3)
Chloride: 100 mmol/L (ref 98–111)
Creatinine, Ser: 0.77 mg/dL (ref 0.61–1.24)
GFR, Estimated: >60 mL/min (ref >60)
Glucose, Bld: 107 mg/dL — ABNORMAL HIGH (ref 70–99)
Potassium: 4.2 mmol/L (ref 3.5–5.1)
Sodium: 137 mmol/L (ref 135–145)

## 2020-10-09 LAB — CBC WITH DIFFERENTIAL/PLATELET
Abs Immature Granulocytes: 0.02 10*3/uL (ref 0.00–0.07)
Basophils Absolute: 0.1 10*3/uL (ref 0.0–0.1)
Basophils Relative: 1 %
Eosinophils Absolute: 0.1 10*3/uL (ref 0.0–0.5)
Eosinophils Relative: 3 %
HCT: 43.6 % (ref 39.0–52.0)
Hemoglobin: 15.4 g/dL (ref 13.0–17.0)
Immature Granulocytes: 0 %
Lymphocytes Relative: 24 %
Lymphs Abs: 1.1 10*3/uL (ref 0.7–4.0)
MCH: 35.7 pg — ABNORMAL HIGH (ref 26.0–34.0)
MCHC: 35.3 g/dL (ref 30.0–36.0)
MCV: 101.2 fL — ABNORMAL HIGH (ref 80.0–100.0)
Monocytes Absolute: 0.5 10*3/uL (ref 0.1–1.0)
Monocytes Relative: 11 %
Neutro Abs: 2.9 10*3/uL (ref 1.7–7.7)
Neutrophils Relative %: 61 %
Platelets: 290 10*3/uL (ref 150–400)
RBC: 4.31 MIL/uL (ref 4.22–5.81)
RDW: 12.6 % (ref 11.5–15.5)
WBC: 4.7 10*3/uL (ref 4.0–10.5)
nRBC: 0 % (ref 0.0–0.2)

## 2020-10-09 LAB — TYPE AND SCREEN
ABO/RH(D): O POS
Antibody Screen: NEGATIVE

## 2020-10-09 LAB — PROTIME-INR
INR: 1 (ref 0.8–1.2)
Prothrombin Time: 12.7 seconds (ref 11.4–15.2)

## 2020-10-09 LAB — SARS CORONAVIRUS 2 (TAT 6-24 HRS): SARS Coronavirus 2: NEGATIVE

## 2020-10-09 LAB — APTT: aPTT: 32 seconds (ref 24–36)

## 2020-10-09 NOTE — Progress Notes (Signed)
Perioperative Services  Pre-Admission/Anesthesia Testing Clinical Review  Date: 10/09/20  Patient Demographics:  Name: Ronnie Ingram DOB:   12-Dec-1955 MRN:   [id number]  Planned Surgical Procedure(s):    Case: [id number] Date/Time: 10/11/20 1100   Procedures:      ENDOVASCULAR REPAIR/STENT GRAFT (N/A )     PORTA CATH REMOVAL (N/A )   Anesthesia type: General   Diagnosis: AAA (abdominal aortic aneurysm) without rupture ([place]) [I71.4]   Pre-op diagnosis: Endovascular AAA stent repair   GORE   AAA   Location: [facility]   Providers: Ronnie Meyer Dolores Lory, MD    NOTE: Available PAT nursing documentation and vital signs have been reviewed. Clinical nursing staff has updated patient's PMH/PSHx, current medication list, and drug allergies/intolerances to ensure comprehensive history available to assist in medical decision making as it pertains to the aforementioned surgical procedure and anticipated anesthetic course.   Clinical Discussion:  Ronnie Ingram is a 65 y.o. male who is submitted for pre-surgical anesthesia review and clearance prior to him undergoing the above procedure. Patient is a Current Smoker (30 pack years). Pertinent PMH includes: CAD, AAA, aortic atherosclerosis, HTN, COPD, GERD (on daily PPI), OA, oropharyngeal (tonsil) cancer.  Patient is followed by cardiology Ronnie Situ, MD). He was last seen in the cardiology clinic on 09/25/2019; notes reviewed.  At the time of his clinic visit, patient doing well overall from a cardiovascular perspective.  He denied any chest pain, PND, orthopnea, palpitations, peripheral edema, vertiginous symptoms, or presyncope/syncope.  Patient with exertional dyspnea following long distance ambulation.  ECG performed in the office revealed normal sinus rhythm at a rate of 61 bpm with no significant ST or T wave changes.  Patient with a PMH (+) for CAD.  He has a known infrarenal AAA measuring 5.2 cm in transverse diameter (previously 4.7  cm in 02/2020) in addition to ectatic bilateral common femoral arteries.  Blood pressure reasonably controlled at 130/80 with no current pharmacological interventions.  He is not currently on a statin for ASCVD risk factor modification.  Additionally, patient with oropharyngeal squamous cell carcinoma (tonsil) diagnosis.  He was approximately 5 months status post concurrent chemoradiation and doing well.  Functional capacity, as defined by DASI, is documented as being >/= 4 METS.  Discussed consideration of myocardial perfusion imaging study should shortness of breath persist.  No changes were made to patient's medication regimen.  Patient to follow-up with outpatient cardiology at defined intervals for ongoing care management.  Patient scheduled to undergo EVAR on 10/11/2020 with Dr. Hortencia Ingram.  Patient will also have his port-a-cath removed at the same time as he has completed his chemotherapy.  Given patient's past medical history significant for cardiovascular diagnoses, presurgical cardiac clearance was sought by the performing surgeon's office and PAT team. Per cardiology, "based on patient's past medical history and time since his last clinic visit, patient would be at an overall ACCEPTABLE risk for the planned procedure without further cardiovascular testing or intervention at this time".  This patient is not currently on any type of anticoagulation/antiplatelet therapy.  Patient denies previous perioperative complications with anesthesia in the past. In review of the available records, it is noted that patient underwent a general anesthetic course here (ASA III) in 03/2020 without documented complications.   Vitals with BMI 10/05/2020 09/27/2020 09/24/2020  Height 5\' 10"  - 5\' 10"   Weight 143 lbs 142 lbs 142 lbs 8 oz  BMI 20.52 03.21 22.48  Systolic - 250 037  Diastolic -  94 80  Pulse - 66 61    Providers/Specialists:   NOTE: Primary physician provider listed below. Patient may have been  seen by APP or partner within same practice.   PROVIDER ROLE / SPECIALTY LAST Anne Hahn, Dolores Lory, MD  Vascular Surgery  09/17/2020  Kerri Perches, PA-C  Primary Care Provider  ???  Ronnie Rogue, MD  Cardiology  09/24/2020  Ronnie Evens, MD  Oncology  09/27/2020   Allergies:  Patient has no known allergies.  Current Home Medications:   No current facility-administered medications for this encounter.   . cholecalciferol (VITAMIN D3) 25 MCG (1000 UNIT) tablet  . gabapentin (NEURONTIN) 300 MG capsule  . montelukast (SINGULAIR) 10 MG tablet  . Omega-3 Fatty Acids (FISH OIL) 1000 MG CAPS  . omeprazole (PRILOSEC OTC) 20 MG tablet  . tiotropium (SPIRIVA) 18 MCG inhalation capsule  . vitamin B-12 (CYANOCOBALAMIN) 1000 MCG tablet  . omeprazole (PRILOSEC) 40 MG capsule   . sodium chloride flush (NS) 0.9 % injection 10 mL   History:   Past Medical History:  Diagnosis Date  . AAA (abdominal aortic aneurysm) without rupture ([place])   . Aortic atherosclerosis ([place])   . Arthritis   . Asthma   . Brain bleed ([place])    several years ago after falling off ladder  . CAD (coronary artery disease)   . COPD (chronic obstructive pulmonary disease) ([place])   . Diverticulitis   . GERD (gastroesophageal reflux disease)   . Hypertension   . Tonsil cancer [facility])    Past Surgical History:  Procedure Laterality Date  . APPENDECTOMY    . FRACTURE SURGERY Left    ORIF left forearm  . HERNIA REPAIR Left    inguinal  . HERNIA REPAIR     abd  . INGUINAL HERNIA REPAIR Right 09/06/2018   Procedure: HERNIA REPAIR INGUINAL ADULT, RIGHT;  Surgeon: Herbert Pun, MD;  Location: [facility] ORS;  Service: General;  Laterality: Right;  . PORTA CATH INSERTION N/A 03/14/2020   Procedure: PORTA CATH INSERTION;  Surgeon: Algernon Huxley, MD;  Location: [facility] CV LAB;  Service: Cardiovascular;  Laterality: N/A;   Family History  Problem Relation Age of Onset  . Lung cancer Mother   . Aneurysm Father    . AAA (abdominal aortic aneurysm) Father   . Hyperlipidemia Father   . Hypertension Father   . AAA (abdominal aortic aneurysm) Brother    Social History   Tobacco Use  . Smoking status: Current Every Day Smoker    Packs/day: 1.00    Years: 30.00    Pack years: 30.00    Types: Cigarettes  . Smokeless tobacco: Never Used  Vaping Use  . Vaping Use: Never used  Substance Use Topics  . Alcohol use: Not Currently    Alcohol/week: 14.0 standard drinks    Types: 14 Cans of beer per week  . Drug use: Yes    Types: Marijuana    Pertinent Clinical Results:  LABS: Labs reviewed: Acceptable for surgery.  Hospital Outpatient Visit on 10/09/2020  Component Date Value Ref Range Status  . WBC 10/09/2020 4.7  4.0 - 10.5 K/uL Final  . RBC 10/09/2020 4.31  4.22 - 5.81 MIL/uL Final  . Hemoglobin 10/09/2020 15.4  13.0 - 17.0 g/dL Final  . HCT 10/09/2020 43.6  39.0 - 52.0 % Final  . MCV 10/09/2020 101.2* 80.0 - 100.0 fL Final  . MCH 10/09/2020 35.7* 26.0 - 34.0 pg Final  . MCHC 10/09/2020 35.3  30.0 - 36.0 g/dL Final  . RDW 10/09/2020 12.6  11.5 - 15.5 % Final  . Platelets 10/09/2020 290  150 - 400 K/uL Final  . nRBC 10/09/2020 0.0  0.0 - 0.2 % Final  . Neutrophils Relative % 10/09/2020 61  % Final  . Neutro Abs 10/09/2020 2.9  1.7 - 7.7 K/uL Final  . Lymphocytes Relative 10/09/2020 24  % Final  . Lymphs Abs 10/09/2020 1.1  0.7 - 4.0 K/uL Final  . Monocytes Relative 10/09/2020 11  % Final  . Monocytes Absolute 10/09/2020 0.5  0.1 - 1.0 K/uL Final  . Eosinophils Relative 10/09/2020 3  % Final  . Eosinophils Absolute 10/09/2020 0.1  0.0 - 0.5 K/uL Final  . Basophils Relative 10/09/2020 1  % Final  . Basophils Absolute 10/09/2020 0.1  0.0 - 0.1 K/uL Final  . Immature Granulocytes 10/09/2020 0  % Final  . Abs Immature Granulocytes 10/09/2020 0.02  0.00 - 0.07 K/uL Final   Performed at [facility], [address] [place], [place] [address]  . Sodium 10/09/2020 137  135 - 145  mmol/L Final  . Potassium 10/09/2020 4.2  3.5 - 5.1 mmol/L Final  . Chloride 10/09/2020 100  98 - 111 mmol/L Final  . CO2 10/09/2020 28  22 - 32 mmol/L Final  . Glucose, Bld 10/09/2020 107* 70 - 99 mg/dL Final   Glucose reference range applies only to samples taken after fasting for at least 8 hours.  . BUN 10/09/2020 12  8 - 23 mg/dL Final  . Creatinine, Ser 10/09/2020 0.77  0.61 - 1.24 mg/dL Final  . Calcium 10/09/2020 9.3  8.9 - 10.3 mg/dL Final  . GFR, Estimated 10/09/2020 >60  >60 mL/min Final   Comment: (NOTE) Calculated using the CKD-EPI Creatinine Equation (2021)   . Anion gap 10/09/2020 9  5 - 15 Final   Performed at [facility], [address] [place], [place] [address]  . Prothrombin Time 10/09/2020 12.7  11.4 - 15.2 seconds Final  . INR 10/09/2020 1.0  0.8 - 1.2 Final   Comment: (NOTE) INR goal varies based on device and disease states. Performed at [facility], [address] [place], [place] [address]   . aPTT 10/09/2020 32  24 - 36 seconds Final   Performed at [facility], [address] [place], [place] [address]    ECG: Date: 09/24/2020 Time ECG obtained: 1404 PM Rate: 61 bpm Rhythm: normal sinus Axis (leads I and aVF): Normal Intervals: PR 162 ms. QRS 90 ms. QTc 402 ms. ST segment and T wave changes: No evidence of acute ST segment elevation or depression Comparison: Similar to previous tracing obtained on 09/02/2018   IMAGING / PROCEDURES: CT ANGIO ABDOMEN PELVIS WITH AND OR WITHOUT CONTRAST performed on 09/12/2020 1. Infrarenal abdominal aortic aneurysm measures 4.8 x 4.9 cm in greatest transverse dimensions. Aneurysm continues to the aortic bifurcation. No evidence of aneurysm rupture or aortic dissection. 2. Bilateral common iliac arteries demonstrate irregular and diffuse dilatation with the right reaching maximal caliber of 20 mm in its proximal segment and the left reaching maximal caliber of approximately 18 mm in  its mid segment. 3. Mildly dilated right common femoral artery measuring up to 12 mm with 50-60% stenosis in its distal portion. 4. Mild dilatation of the left common femoral artery with maximum caliber of 11 mm. 5. Intrathoracic origin of the celiac axis just above the diaphragmatic hiatus.  PET SCAN IMAGING FOR staging GOAL BASE  TO THIGH performed on 08/21/2020 1. Resolution of prior left cervical adenopathy. Marked reduction in size and activity of the left tonsillar mass. No new active malignancy identified. 2. Infrarenal abdominal aortic aneurysm 5.2 cm in transverse dimension, previously 4.7 cm on 02/29/2020. Recommend referral to a vascular specialist. 3. Other imaging findings of potential clinical significance:  Chronic right inferior frontal lobe encephalomalacia.   Aortic atherosclerosis   Coronary atherosclerosis  Airway thickening is present, suggesting bronchitis or reactive airways disease  Impression and Plan:  Ronnie Ingram has been referred for pre-anesthesia review and clearance prior to him undergoing the planned anesthetic and procedural courses. Available labs, pertinent testing, and imaging results were personally reviewed by me. This patient has been appropriately cleared by cardiology with an overall ACCEPTABLE risk of significant perioperative cardiovascular complications.  Based on clinical review performed today (10/09/20), barring any significant acute changes in the patient's overall condition, it is anticipated that he will be able to proceed with the planned surgical intervention. Any acute changes in clinical condition may necessitate his procedure being postponed and/or cancelled. Patient will meet with anesthesia team (MD and/or CRNA) on this day of his procedure for preoperative evaluation/assessment.   Pre-surgical instructions were reviewed with the patient during his PAT appointment and questions were fielded by PAT clinical staff. Patient was advised  that if any questions or concerns arise prior to his procedure then he should return a call to PAT and/or his surgeon's office to discuss.  Honor Loh, MSN, APRN, FNP-C, CEN [facility]  Peri-operative Services Nurse Practitioner Phone: [phone] 10/09/20 3:11 PM  NOTE: This note has been prepared using Dragon dictation software. Despite my best ability to proofread, there is always the potential that unintentional transcriptional errors may still occur from this process.

## 2020-10-10 ENCOUNTER — Other Ambulatory Visit (INDEPENDENT_AMBULATORY_CARE_PROVIDER_SITE_OTHER): Payer: Self-pay | Admitting: Nurse Practitioner

## 2020-10-11 ENCOUNTER — Inpatient Hospital Stay: Payer: Medicare Other | Admitting: Urgent Care

## 2020-10-11 ENCOUNTER — Inpatient Hospital Stay
Admission: RE | Admit: 2020-10-11 | Discharge: 2020-10-12 | DRG: 268 | Disposition: A | Discharge status: Home / Self Care | Payer: Medicare Other | Attending: Vascular Surgery | Admitting: Vascular Surgery

## 2020-10-11 ENCOUNTER — Encounter: Payer: Self-pay | Admitting: Vascular Surgery

## 2020-10-11 ENCOUNTER — Other Ambulatory Visit: Payer: Self-pay

## 2020-10-11 ENCOUNTER — Encounter
Admission: RE | Disposition: A | Discharge status: Home / Self Care | Payer: Self-pay | Source: Home / Self Care | Attending: Vascular Surgery

## 2020-10-11 DIAGNOSIS — Z83438 Family history of other disorder of lipoprotein metabolism and other lipidemia: Secondary | ICD-10-CM

## 2020-10-11 DIAGNOSIS — Z923 Personal history of irradiation: Secondary | ICD-10-CM | POA: Diagnosis not present

## 2020-10-11 DIAGNOSIS — E785 Hyperlipidemia, unspecified: Secondary | ICD-10-CM | POA: Diagnosis present

## 2020-10-11 DIAGNOSIS — Z87820 Personal history of traumatic brain injury: Secondary | ICD-10-CM | POA: Diagnosis not present

## 2020-10-11 DIAGNOSIS — E43 Unspecified severe protein-calorie malnutrition: Secondary | ICD-10-CM | POA: Diagnosis present

## 2020-10-11 DIAGNOSIS — J449 Chronic obstructive pulmonary disease, unspecified: Secondary | ICD-10-CM | POA: Diagnosis present

## 2020-10-11 DIAGNOSIS — I700 Atherosclerosis of aorta: Secondary | ICD-10-CM | POA: Diagnosis present

## 2020-10-11 DIAGNOSIS — Z6820 Body mass index (BMI) 20.0-20.9, adult: Secondary | ICD-10-CM | POA: Diagnosis not present

## 2020-10-11 DIAGNOSIS — I2510 Atherosclerotic heart disease of native coronary artery without angina pectoris: Secondary | ICD-10-CM | POA: Diagnosis present

## 2020-10-11 DIAGNOSIS — F17210 Nicotine dependence, cigarettes, uncomplicated: Secondary | ICD-10-CM | POA: Diagnosis present

## 2020-10-11 DIAGNOSIS — Z8249 Family history of ischemic heart disease and other diseases of the circulatory system: Secondary | ICD-10-CM | POA: Diagnosis not present

## 2020-10-11 DIAGNOSIS — Z9221 Personal history of antineoplastic chemotherapy: Secondary | ICD-10-CM

## 2020-10-11 DIAGNOSIS — C109 Malignant neoplasm of oropharynx, unspecified: Secondary | ICD-10-CM

## 2020-10-11 DIAGNOSIS — Z801 Family history of malignant neoplasm of trachea, bronchus and lung: Secondary | ICD-10-CM | POA: Diagnosis not present

## 2020-10-11 DIAGNOSIS — Z85818 Personal history of malignant neoplasm of other sites of lip, oral cavity, and pharynx: Secondary | ICD-10-CM | POA: Diagnosis not present

## 2020-10-11 DIAGNOSIS — K219 Gastro-esophageal reflux disease without esophagitis: Secondary | ICD-10-CM | POA: Diagnosis present

## 2020-10-11 DIAGNOSIS — Z20822 Contact with and (suspected) exposure to covid-19: Secondary | ICD-10-CM | POA: Diagnosis present

## 2020-10-11 DIAGNOSIS — I714 Abdominal aortic aneurysm, without rupture, unspecified: Secondary | ICD-10-CM | POA: Diagnosis present

## 2020-10-11 DIAGNOSIS — I10 Essential (primary) hypertension: Secondary | ICD-10-CM | POA: Diagnosis present

## 2020-10-11 HISTORY — PX: ENDOVASCULAR REPAIR/STENT GRAFT: CATH118280

## 2020-10-11 HISTORY — DX: Atherosclerotic heart disease of native coronary artery without angina pectoris: I25.10

## 2020-10-11 HISTORY — DX: Atherosclerosis of aorta: I70.0

## 2020-10-11 HISTORY — PX: PORTA CATH REMOVAL: CATH118286

## 2020-10-11 HISTORY — DX: Abdominal aortic aneurysm, without rupture: I71.4

## 2020-10-11 HISTORY — DX: Gastro-esophageal reflux disease without esophagitis: K21.9

## 2020-10-11 HISTORY — DX: Abdominal aortic aneurysm, without rupture, unspecified: I71.40

## 2020-10-11 LAB — URINE DRUG SCREEN, QUALITATIVE (ARMC ONLY)
Amphetamines, Ur Screen: NOT DETECTED
Barbiturates, Ur Screen: NOT DETECTED
Benzodiazepine, Ur Scrn: POSITIVE — AB
Cannabinoid 50 Ng, Ur ~~LOC~~: POSITIVE — AB
Cocaine Metabolite,Ur ~~LOC~~: NOT DETECTED
MDMA (Ecstasy)Ur Screen: NOT DETECTED
Methadone Scn, Ur: NOT DETECTED
Opiate, Ur Screen: NOT DETECTED
Phencyclidine (PCP) Ur S: NOT DETECTED
Tricyclic, Ur Screen: NOT DETECTED

## 2020-10-11 LAB — MRSA PCR SCREENING: MRSA by PCR: NEGATIVE

## 2020-10-11 LAB — ABO/RH: ABO/RH(D): O POS

## 2020-10-11 LAB — GLUCOSE, CAPILLARY: Glucose-Capillary: 126 mg/dL — ABNORMAL HIGH (ref 70–99)

## 2020-10-11 SURGERY — ENDOVASCULAR STENT GRAFT (AAA)
Anesthesia: General

## 2020-10-11 MED ORDER — ORAL CARE MOUTH RINSE: 15.0000 mL | Freq: Once | OROMUCOSAL | Status: DC

## 2020-10-11 MED ORDER — ONDANSETRON HCL 4 MG/2ML IJ SOLN
INTRAMUSCULAR | Status: DC | PRN
  Administered 2020-10-11: 4 mg via INTRAVENOUS

## 2020-10-11 MED ORDER — CEFAZOLIN SODIUM-DEXTROSE 2-4 GM/100ML-% IV SOLN
2.0000 g | Freq: Three times a day (TID) | INTRAVENOUS | Status: AC
  Administered 2020-10-11 – 2020-10-12 (×2): 2 g via INTRAVENOUS
  Filled 2020-10-11 (×2): qty 100

## 2020-10-11 MED ORDER — VITAMIN B-12 1000 MCG PO TABS
1000.0000 ug | ORAL_TABLET | Freq: Every day | ORAL | Status: DC
  Administered 2020-10-12: 1000 ug via ORAL
  Filled 2020-10-11: qty 1

## 2020-10-11 MED ORDER — IODIXANOL 320 MG/ML IV SOLN
INTRAVENOUS | Status: DC | PRN
  Administered 2020-10-11: 60 mL

## 2020-10-11 MED ORDER — ACETAMINOPHEN 325 MG PO TABS
325.0000 mg | ORAL_TABLET | ORAL | Status: DC | PRN
Start: 2020-10-11 — End: 2020-10-12

## 2020-10-11 MED ORDER — SODIUM CHLORIDE 0.9 % IV SOLN: INTRAVENOUS | Status: DC

## 2020-10-11 MED ORDER — LACTATED RINGERS IV SOLN: INTRAVENOUS | Status: DC

## 2020-10-11 MED ORDER — ORAL CARE MOUTH RINSE
15.0000 mL | Freq: Two times a day (BID) | OROMUCOSAL | Status: DC
  Administered 2020-10-11: 15 mL via OROMUCOSAL

## 2020-10-11 MED ORDER — VITAMIN D 25 MCG (1000 UNIT) PO TABS
1000.0000 [IU] | ORAL_TABLET | Freq: Every day | ORAL | Status: DC
  Administered 2020-10-12: 1000 [IU] via ORAL
  Filled 2020-10-11: qty 1

## 2020-10-11 MED ORDER — FAMOTIDINE IN NACL 20-0.9 MG/50ML-% IV SOLN
20.0000 mg | Freq: Two times a day (BID) | INTRAVENOUS | Status: DC
  Administered 2020-10-11: 20 mg via INTRAVENOUS
  Filled 2020-10-11: qty 50

## 2020-10-11 MED ORDER — FENTANYL CITRATE (PF) 100 MCG/2ML IJ SOLN
INTRAMUSCULAR | Status: DC | PRN
  Administered 2020-10-11 (×4): 25 ug via INTRAVENOUS

## 2020-10-11 MED ORDER — HYDROMORPHONE HCL 1 MG/ML IJ SOLN
1.0000 mg | Freq: Once | INTRAMUSCULAR | Status: DC | PRN
Start: 2020-10-11 — End: 2020-10-12

## 2020-10-11 MED ORDER — PHENOL 1.4 % MT LIQD
1.0000 | OROMUCOSAL | Status: DC | PRN
  Filled 2020-10-11: qty 177

## 2020-10-11 MED ORDER — ASPIRIN EC 81 MG PO TBEC
81.0000 mg | DELAYED_RELEASE_TABLET | Freq: Every day | ORAL | Status: DC
  Administered 2020-10-12: 81 mg via ORAL
  Filled 2020-10-11: qty 1

## 2020-10-11 MED ORDER — FENTANYL CITRATE (PF) 100 MCG/2ML IJ SOLN
INTRAMUSCULAR | Status: AC
  Filled 2020-10-11: qty 2

## 2020-10-11 MED ORDER — MONTELUKAST SODIUM 10 MG PO TABS
10.0000 mg | ORAL_TABLET | Freq: Every day | ORAL | Status: DC
  Filled 2020-10-11: qty 1

## 2020-10-11 MED ORDER — MIDAZOLAM HCL 2 MG/2ML IJ SOLN
INTRAMUSCULAR | Status: DC | PRN
  Administered 2020-10-11: 2 mg via INTRAVENOUS

## 2020-10-11 MED ORDER — CLOPIDOGREL BISULFATE 75 MG PO TABS
75.0000 mg | ORAL_TABLET | Freq: Every day | ORAL | Status: DC
  Administered 2020-10-12: 75 mg via ORAL
  Filled 2020-10-11: qty 1

## 2020-10-11 MED ORDER — IPRATROPIUM-ALBUTEROL 0.5-2.5 (3) MG/3ML IN SOLN
3.0000 mL | Freq: Once | RESPIRATORY_TRACT | Status: AC
  Administered 2020-10-11: 3 mL via RESPIRATORY_TRACT

## 2020-10-11 MED ORDER — ATORVASTATIN CALCIUM 10 MG PO TABS
10.0000 mg | ORAL_TABLET | Freq: Every day | ORAL | Status: DC
  Administered 2020-10-12: 10 mg via ORAL
  Filled 2020-10-11: qty 1

## 2020-10-11 MED ORDER — CHLORHEXIDINE GLUCONATE CLOTH 2 % EX PADS
6.0000 | MEDICATED_PAD | Freq: Once | CUTANEOUS | Status: AC
  Administered 2020-10-11: 6 via TOPICAL

## 2020-10-11 MED ORDER — PROPOFOL 10 MG/ML IV BOLUS
INTRAVENOUS | Status: AC
  Filled 2020-10-11: qty 20

## 2020-10-11 MED ORDER — ALUM & MAG HYDROXIDE-SIMETH 200-200-20 MG/5ML PO SUSP: 15.0000 mL | ORAL | Status: DC | PRN

## 2020-10-11 MED ORDER — CHLORHEXIDINE GLUCONATE 0.12 % MT SOLN: 15.0000 mL | Freq: Two times a day (BID) | OROMUCOSAL | Status: DC

## 2020-10-11 MED ORDER — TIOTROPIUM BROMIDE MONOHYDRATE 18 MCG IN CAPS
18.0000 ug | ORAL_CAPSULE | Freq: Every day | RESPIRATORY_TRACT | Status: DC
  Administered 2020-10-11 – 2020-10-12 (×2): 18 ug via RESPIRATORY_TRACT
  Filled 2020-10-11: qty 5

## 2020-10-11 MED ORDER — HYDRALAZINE HCL 20 MG/ML IJ SOLN
5.0000 mg | INTRAMUSCULAR | Status: DC | PRN
  Administered 2020-10-11: 5 mg via INTRAVENOUS
  Filled 2020-10-11: qty 1

## 2020-10-11 MED ORDER — OMEGA-3-ACID ETHYL ESTERS 1 G PO CAPS
1.0000 g | ORAL_CAPSULE | Freq: Every day | ORAL | Status: DC
  Administered 2020-10-12: 1 g via ORAL
  Filled 2020-10-11: qty 1

## 2020-10-11 MED ORDER — SEVOFLURANE IN SOLN
RESPIRATORY_TRACT | Status: AC
  Filled 2020-10-11: qty 250

## 2020-10-11 MED ORDER — SODIUM CHLORIDE 0.9 % IV SOLN: 500.0000 mL | Freq: Once | INTRAVENOUS | Status: DC | PRN

## 2020-10-11 MED ORDER — GABAPENTIN 300 MG PO CAPS: 300.0000 mg | ORAL_CAPSULE | Freq: Every day | ORAL | Status: DC | PRN

## 2020-10-11 MED ORDER — CEFAZOLIN SODIUM-DEXTROSE 2-4 GM/100ML-% IV SOLN
2.0000 g | INTRAVENOUS | Status: AC
  Administered 2020-10-11: 2 g via INTRAVENOUS

## 2020-10-11 MED ORDER — OXYCODONE-ACETAMINOPHEN 5-325 MG PO TABS: 1.0000 | ORAL_TABLET | ORAL | Status: DC | PRN

## 2020-10-11 MED ORDER — OMEPRAZOLE MAGNESIUM 20 MG PO TBEC: 20.0000 mg | DELAYED_RELEASE_TABLET | Freq: Every day | ORAL | Status: DC

## 2020-10-11 MED ORDER — FENTANYL CITRATE (PF) 100 MCG/2ML IJ SOLN: 25.0000 ug | INTRAMUSCULAR | Status: DC | PRN

## 2020-10-11 MED ORDER — ONDANSETRON HCL 4 MG/2ML IJ SOLN
INTRAMUSCULAR | Status: AC
  Filled 2020-10-11: qty 2

## 2020-10-11 MED ORDER — HYDROCODONE-ACETAMINOPHEN 7.5-325 MG PO TABS: 1.0000 | ORAL_TABLET | Freq: Once | ORAL | Status: DC | PRN

## 2020-10-11 MED ORDER — GUAIFENESIN-DM 100-10 MG/5ML PO SYRP: 15.0000 mL | ORAL_SOLUTION | ORAL | Status: DC | PRN

## 2020-10-11 MED ORDER — MIDAZOLAM HCL 2 MG/2ML IJ SOLN
INTRAMUSCULAR | Status: AC
  Filled 2020-10-11: qty 2

## 2020-10-11 MED ORDER — ACETAMINOPHEN 160 MG/5ML PO SOLN
325.0000 mg | ORAL | Status: DC | PRN
Start: 2020-10-11 — End: 2020-10-11
  Filled 2020-10-11: qty 20.3

## 2020-10-11 MED ORDER — MAGNESIUM SULFATE 2 GM/50ML IV SOLN: 2.0000 g | Freq: Every day | INTRAVENOUS | Status: DC | PRN

## 2020-10-11 MED ORDER — PANTOPRAZOLE SODIUM 40 MG PO TBEC
40.0000 mg | DELAYED_RELEASE_TABLET | Freq: Every day | ORAL | Status: DC
  Administered 2020-10-12: 40 mg via ORAL
  Filled 2020-10-11: qty 1

## 2020-10-11 MED ORDER — FENTANYL CITRATE (PF) 100 MCG/2ML IJ SOLN
INTRAMUSCULAR | Status: AC
  Administered 2020-10-11: 50 ug via INTRAVENOUS
  Filled 2020-10-11: qty 2

## 2020-10-11 MED ORDER — ONDANSETRON HCL 4 MG/2ML IJ SOLN: 4.0000 mg | Freq: Once | INTRAMUSCULAR | Status: DC | PRN

## 2020-10-11 MED ORDER — CEFAZOLIN SODIUM-DEXTROSE 2-4 GM/100ML-% IV SOLN
INTRAVENOUS | Status: AC
  Filled 2020-10-11: qty 100

## 2020-10-11 MED ORDER — DOPAMINE-DEXTROSE 3.2-5 MG/ML-% IV SOLN: 3.0000 ug/kg/min | INTRAVENOUS | Status: DC

## 2020-10-11 MED ORDER — MORPHINE SULFATE (PF) 4 MG/ML IV SOLN
2.0000 mg | INTRAVENOUS | Status: DC | PRN
  Administered 2020-10-11: 4 mg via INTRAVENOUS
  Filled 2020-10-11: qty 1

## 2020-10-11 MED ORDER — NITROGLYCERIN IN D5W 200-5 MCG/ML-% IV SOLN: 5.0000 ug/min | INTRAVENOUS | Status: DC

## 2020-10-11 MED ORDER — CHLORHEXIDINE GLUCONATE 0.12 % MT SOLN
15.0000 mL | Freq: Once | OROMUCOSAL | Status: DC
  Filled 2020-10-11: qty 15

## 2020-10-11 MED ORDER — LABETALOL HCL 5 MG/ML IV SOLN: 10.0000 mg | INTRAVENOUS | Status: DC | PRN

## 2020-10-11 MED ORDER — HEPARIN SODIUM (PORCINE) 1000 UNIT/ML IJ SOLN
INTRAMUSCULAR | Status: DC | PRN
  Administered 2020-10-11: 5000 [IU] via INTRAVENOUS

## 2020-10-11 MED ORDER — IPRATROPIUM-ALBUTEROL 0.5-2.5 (3) MG/3ML IN SOLN
RESPIRATORY_TRACT | Status: AC
  Filled 2020-10-11: qty 3

## 2020-10-11 MED ORDER — ONDANSETRON HCL 4 MG/2ML IJ SOLN: 4.0000 mg | Freq: Four times a day (QID) | INTRAMUSCULAR | Status: DC | PRN

## 2020-10-11 MED ORDER — CHLORHEXIDINE GLUCONATE CLOTH 2 % EX PADS: 6.0000 | MEDICATED_PAD | Freq: Once | CUTANEOUS | Status: DC

## 2020-10-11 MED ORDER — PROPOFOL 10 MG/ML IV BOLUS
INTRAVENOUS | Status: DC | PRN
  Administered 2020-10-11 (×2): 100 mg via INTRAVENOUS

## 2020-10-11 MED ORDER — LIDOCAINE HCL (CARDIAC) PF 100 MG/5ML IV SOSY
PREFILLED_SYRINGE | INTRAVENOUS | Status: DC | PRN
  Administered 2020-10-11: 100 mg via INTRAVENOUS

## 2020-10-11 MED ORDER — ACETAMINOPHEN 325 MG PO TABS: 325.0000 mg | ORAL_TABLET | ORAL | Status: DC | PRN

## 2020-10-11 MED ORDER — DOCUSATE SODIUM 100 MG PO CAPS
100.0000 mg | ORAL_CAPSULE | Freq: Every day | ORAL | Status: DC
  Administered 2020-10-12: 100 mg via ORAL
  Filled 2020-10-11 (×2): qty 1

## 2020-10-11 MED ORDER — ACETAMINOPHEN 650 MG RE SUPP
325.0000 mg | RECTAL | Status: DC | PRN
Start: 2020-10-11 — End: 2020-10-12

## 2020-10-11 MED ORDER — METOPROLOL TARTRATE 5 MG/5ML IV SOLN: 2.0000 mg | INTRAVENOUS | Status: DC | PRN

## 2020-10-11 MED ORDER — POTASSIUM CHLORIDE CRYS ER 20 MEQ PO TBCR
20.0000 meq | EXTENDED_RELEASE_TABLET | Freq: Every day | ORAL | Status: DC | PRN
Start: 2020-10-11 — End: 2020-10-12

## 2020-10-11 SURGICAL SUPPLY — 55 items
BAG DECANTER FOR FLEXI CONT (MISCELLANEOUS) ×1 IMPLANT
BLADE SURG 15 STRL LF DISP TIS (BLADE) IMPLANT
BLADE SURG 15 STRL SS (BLADE) ×1
BLADE SURG SZ11 CARB STEEL (BLADE) ×1 IMPLANT
BRUSH SCRUB EZ  4% CHG (MISCELLANEOUS) ×1
BRUSH SCRUB EZ 4% CHG (MISCELLANEOUS) IMPLANT
CATH ACCU-VU SIZ PIG 5F 70CM (CATHETERS) ×1 IMPLANT
CATH BALLN CODA 9X100X32 (BALLOONS) ×1 IMPLANT
CATH BEACON 5 .035 65 C2 TIP (CATHETERS) ×1 IMPLANT
CATH KUMPE SOFT-VU 5FR 65 (CATHETERS) ×1 IMPLANT
CLOSURE PERCLOSE PROSTYLE (VASCULAR PRODUCTS) ×4 IMPLANT
COVER PROBE U/S 5X48 (MISCELLANEOUS) ×1 IMPLANT
DERMABOND ADVANCED (GAUZE/BANDAGES/DRESSINGS) ×3
DERMABOND ADVANCED .7 DNX12 (GAUZE/BANDAGES/DRESSINGS) IMPLANT
DEVICE SAFEGUARD 24CM (GAUZE/BANDAGES/DRESSINGS) ×2 IMPLANT
DEVICE TORQUE (MISCELLANEOUS) ×1 IMPLANT
DRYSEAL FLEXSHEATH 12FR 33CM (SHEATH) ×1
DRYSEAL FLEXSHEATH 16FR 33CM (SHEATH) ×1
ELECT CAUTERY BLADE 6.4 (BLADE) ×1 IMPLANT
ELECT REM PT RETURN 9FT ADLT (ELECTROSURGICAL) ×2
ELECTRODE REM PT RTRN 9FT ADLT (ELECTROSURGICAL) IMPLANT
EXCLUDER TNK 23X14.5MMX12CM (Endovascular Graft) IMPLANT
EXCLUDER TRUNK 23X14.5MMX12CM (Endovascular Graft) ×2 IMPLANT
GLIDEWIRE STIFF .35X180X3 HYDR (WIRE) ×1 IMPLANT
GLOVE SURG ENC MOIS LTX SZ7 (GLOVE) ×1 IMPLANT
GLOVE SURG SYN 8.0 (GLOVE) ×2 IMPLANT
GLOVE SURG SYN 8.0 PF PI (GLOVE) IMPLANT
GOWN STRL REUS W/ TWL LRG LVL3 (GOWN DISPOSABLE) IMPLANT
GOWN STRL REUS W/ TWL XL LVL3 (GOWN DISPOSABLE) IMPLANT
GOWN STRL REUS W/TWL LRG LVL3 (GOWN DISPOSABLE) ×1
GOWN STRL REUS W/TWL XL LVL3 (GOWN DISPOSABLE) ×2
IV NS 500ML (IV SOLUTION) ×1
IV NS 500ML BAXH (IV SOLUTION) IMPLANT
KIT MICROPUNCTURE NIT STIFF (SHEATH) ×1 IMPLANT
LEG CONTRALATERAL 16X18X9.5 (Endovascular Graft) ×1 IMPLANT
LEG CONTRALATERAL 16X20X9.5 (Endovascular Graft) ×1 IMPLANT
NDL ENTRY 21GA 7CM ECHOTIP (NEEDLE) IMPLANT
NEEDLE ENTRY 21GA 7CM ECHOTIP (NEEDLE) ×2 IMPLANT
PACK ANGIOGRAPHY (CUSTOM PROCEDURE TRAY) ×2 IMPLANT
PACK BASIN MAJOR ARMC (MISCELLANEOUS) ×1 IMPLANT
SHEATH BRITE TIP 6FRX11 (SHEATH) ×2 IMPLANT
SHEATH BRITE TIP 8FRX11 (SHEATH) ×2 IMPLANT
SHEATH DRYSEAL FLEX 12FR 33CM (SHEATH) IMPLANT
SHEATH DRYSEAL FLEX 16FR 33CM (SHEATH) IMPLANT
SPONGE XRAY 4X4 16PLY STRL (MISCELLANEOUS) ×3 IMPLANT
STENT GRAFT CONTRALAT 16X20X9. (Endovascular Graft) IMPLANT
SUT MNCRL+ 5-0 UNDYED PC-3 (SUTURE) IMPLANT
SUT MONOCRYL 5-0 (SUTURE) ×1
SUT VICRYL+ 3-0 36IN CT-1 (SUTURE) ×1 IMPLANT
SYR BULB IRRIG 60ML STRL (SYRINGE) ×1 IMPLANT
SYR MEDRAD MARK 7 150ML (SYRINGE) ×1 IMPLANT
TOWEL OR 17X26 4PK STRL BLUE (TOWEL DISPOSABLE) ×1 IMPLANT
TUBING CONTRAST HIGH PRESS 72 (TUBING) ×1 IMPLANT
WIRE AMPLATZ SSTIFF .035X260CM (WIRE) ×2 IMPLANT
WIRE GUIDERIGHT .035X150 (WIRE) ×4 IMPLANT

## 2020-10-11 NOTE — OR Nursing (Signed)
PT denies need to void

## 2020-10-11 NOTE — Transfer of Care (Signed)
Immediate Anesthesia Transfer of Care Note  Patient: Ronnie Ingram  Procedure(s) Performed: ENDOVASCULAR REPAIR/STENT GRAFT (N/A ) PORTA CATH REMOVAL (N/A )  Patient Location: PACU  Anesthesia Type:General  Level of Consciousness: drowsy  Airway & Oxygen Therapy: Patient Spontanous Breathing and Patient connected to face mask oxygen  Post-op Assessment: Report given to RN  Post vital signs: stable  Last Vitals:  Vitals Value Taken Time  BP 162/111 10/11/20 1348  Temp    Pulse 75 10/11/20 1348  Resp 19 10/11/20 1348  SpO2 99 % 10/11/20 1348  Vitals shown include unvalidated device data.  Last Pain:  Vitals:   10/11/20 1103  TempSrc: Oral  PainSc: 0-No pain         Complications: No complications documented.

## 2020-10-11 NOTE — Op Note (Signed)
  OPERATIVE NOTE   PROCEDURE: Removal of Infuse-a-Port  PRE-OPERATIVE DIAGNOSIS: Squamous cell carcinoma of the oropharynx in remission  POST-OPERATIVE DIAGNOSIS: Same  SURGEON: Hortencia Pilar, M.D.  ANESTHESIA: General anesthesia as the patient is status post abdominal aortic aneurysm repair and this is being performed as an additional procedure  ESTIMATED BLOOD LOSS: Minimal   SPECIMEN(S):  Infuse-a-port intact  INDICATIONS:   Ronnie Ingram is a 65 y.o. y.o. male who presents with squamous cell carcinoma of the oropharynx in remission.  The chemotherapy has been completed and the port is no longer required. Patient is therefore undergoing removal of the port. The risks and benefits of been reviewed all questions answered patient agrees to proceed with port removal   DESCRIPTION: After obtaining full informed written consent, the patient is brought to special procedures and positioned supine.  The patient received IV antibiotics.  The patient was reprepped and redraped in the standard fashion appropriate time out is called.    After infiltrating 1% lidocaine with epinephrine into the soft tissues and skin surrounding the port the previous incisional scar is reopened with an 11 blade scalpel.  The port is slipped from the pocket and subsequently removed without difficulty otherwise intact.  Pressure is held at the base of the neck for 5 minutes, a pocket is irrigated.  The subcutaneous tissues were then reapproximated with 3-0 Vicryl and the skin is closed with 4-0 Monocryl subcuticular.  Dermabond is applied.  The patient tolerated the procedure without changes  COMPLICATIONS: None  CONDITION: Good  Hortencia Pilar, M.D. [facility] Office: [phone]   10/11/2020, 1:43 PM

## 2020-10-11 NOTE — Anesthesia Procedure Notes (Signed)
Procedure Name: LMA Insertion Date/Time: 10/11/2020 11:56 AM Performed by: Hedda Slade, CRNA Pre-anesthesia Checklist: Patient identified, Patient being monitored, Timeout performed, Emergency Drugs available and Suction available Patient Re-evaluated:Patient Re-evaluated prior to induction Oxygen Delivery Method: Circle system utilized Preoxygenation: Pre-oxygenation with 100% oxygen Induction Type: IV induction Ventilation: Mask ventilation without difficulty LMA: LMA inserted LMA Size: 4.0 Tube type: Oral Number of attempts: 1 Placement Confirmation: positive ETCO2 and breath sounds checked- equal and bilateral Tube secured with: Tape Dental Injury: Teeth and Oropharynx as per pre-operative assessment

## 2020-10-11 NOTE — Plan of Care (Signed)
  Problem: Bowel/Gastric: Goal: Gastrointestinal status for postoperative course will improve Outcome: Progressing   Problem: Cardiac: Goal: Ability to maintain an adequate cardiac output will improve Outcome: Progressing   Problem: Clinical Measurements: Goal: Postoperative complications will be avoided or minimized Outcome: Progressing   Problem: Respiratory: Goal: Respiratory status will improve Outcome: Progressing   Problem: Skin Integrity: Goal: Demonstration of wound healing without infection will improve Outcome: Progressing   Problem: Urinary Elimination: Goal: Ability to achieve and maintain adequate renal perfusion and functioning will improve Outcome: Progressing

## 2020-10-11 NOTE — Interval H&P Note (Signed)
History and Physical Interval Note:  10/11/2020 11:09 AM  Ronnie Ingram  has presented today for surgery, with the diagnosis of Endovascular AAA stent repair   GORE   AAA.  The various methods of treatment have been discussed with the patient and family. After consideration of risks, benefits and other options for treatment, the patient has consented to  Procedure(s): ENDOVASCULAR REPAIR/STENT GRAFT (N/A) PORTA CATH REMOVAL (N/A) as a surgical intervention.  The patient's history has been reviewed, patient examined, no change in status, stable for surgery.  I have reviewed the patient's chart and labs.  Questions were answered to the patient's satisfaction.     Hortencia Pilar

## 2020-10-11 NOTE — Op Note (Signed)
OPERATIVE NOTE   PROCEDURE: 1. US guidance for vascular access, bilateral femoral arteries 2. Catheter placement into aorta from bilateral femoral approaches 3. Placement of a 23 mm proximal, 14 mm distal, 12 cm length Gore Excluder Endoprosthesis main body right with a 20 mm x 10 cm left contralateral limb 4. Placement of an 18 mm diameter by 10 cm length right iliac extension limb 5. ProGlide closure devices bilateral femoral arteries  PRE-OPERATIVE DIAGNOSIS: AAA  POST-OPERATIVE DIAGNOSIS: same  SURGEON: Leotis Pain, MD and Hortencia Pilar, MD - Co-surgeons  ANESTHESIA: General  ESTIMATED BLOOD LOSS: 10 cc  FINDING(S): 1.  AAA  SPECIMEN(S):  none  INDICATIONS:   Ronnie Ingram is a 65 y.o. male who presents with greater than 5 cm abdominal aortic aneurysm. The anatomy was suitable for endovascular repair.  Risks and benefits of repair in an endovascular fashion were discussed and informed consent was obtained. Co-surgeons are used to expedite the procedure and reduce operative time as bilateral work needs to be done.  DESCRIPTION: After obtaining full informed written consent, the patient was brought back to the operating room and placed supine upon the operating table.  The patient received IV antibiotics prior to induction.  After obtaining adequate anesthesia, the patient was prepped and draped in the standard fashion for endovascular AAA repair.  We then began by gaining access to both femoral arteries with US guidance with me working on the left and Dr. Delana Meyer working on the right.  The femoral arteries were found to be patent and accessed without difficulty with a needle under ultrasound guidance without difficulty on each side and permanent images were recorded.  We then placed 2 proglide devices on each side in a pre-close fashion and placed 8 French sheaths. The patient was then given 5000 units of intravenous heparin. The Pigtail catheter was placed into the aorta from  the right side. Using this image, we selected a 23 mm diameter by 14 mm distal by 12 centimeter length Main body device.  Over a stiff wire, an 16 French sheath was placed. The main body was then placed through the 16 French sheath. A Kumpe catheter was placed up the left side and a magnified image at the renal arteries was performed. The main body was then deployed just below the lowest renal artery. The Kumpe catheter was used to cannulate the contralateral gate without difficulty and successful cannulation was confirmed by twirling the pigtail catheter in the main body. We then placed a stiff wire and a retrograde arteriogram was performed through the left femoral sheath. We upsized to the 12 [place] sheath for the contralateral limb and a 20 mm x 10 cm left iliac limb was selected and deployed. The main body deployment was then completed. Based off the angiographic findings, extension limbs were necessary.  An 18 mm diameter by 10 cm length right iliac extension limb was then taken down to just above the right hypogastric artery. All junction points and seals zones were treated with the compliant balloon. The pigtail catheter was then replaced and a completion angiogram was performed.  No obvious Endoleak was detected on completion angiography. The renal arteries were found to be widely patent.  Both hypogastric arteries were found to be patent as well. At this point we elected to terminate the procedure. We secured the pro glide devices for hemostasis on the femoral arteries. The skin incision was closed with a 4-0 Monocryl. Dermabond and pressure dressing were placed. The patient was  taken to the recovery room in stable condition having tolerated the procedure well.  COMPLICATIONS: none  CONDITION: stable  Leotis Pain  10/11/2020, 1:20 PM   This note was created with Dragon Medical transcription system. Any errors in dictation are purely unintentional.

## 2020-10-11 NOTE — Op Note (Signed)
OPERATIVE NOTE   PROCEDURE: 1. US guidance for vascular access, bilateral femoral arteries 2. Catheter placement into aorta from bilateral femoral approaches 3. Placement of a 23 x 14 x 12 C3 Gore Excluder Endoprosthesis main body  with a 20 x 10 contralateral limb and a 18 x 10 ipsilateral extender limb 4. ProGlide closure devices bilateral femoral arteries  PRE-OPERATIVE DIAGNOSIS: AAA  POST-OPERATIVE DIAGNOSIS: same  SURGEON: Hortencia Pilar, MD and Leotis Pain, MD - Co-surgeons  ANESTHESIA: general  ESTIMATED BLOOD LOSS: 50 cc  FINDING(S): 1.  AAA  SPECIMEN(S):  none  INDICATIONS:   Ronnie Ingram is a 65 y.o. y.o. male who presents with an abdominal aortic aneurysm greater than 5 cm.  He is in excellent candidate for an endograft and therefore is undergoing endovascular repair to prevent lethal rupture.  Risks and benefits of been reviewed all questions been answered patient agrees to proceed.  DESCRIPTION: After obtaining full informed written consent, the patient was brought back to the operating room and placed supine upon the operating table.  The patient received IV antibiotics prior to induction.  After obtaining adequate anesthesia, the patient was prepped and draped in the standard fashion for endovascular AAA repair.  Co-surgeons are required because this is a complex bilateral procedure with work being performed simultaneously from both the right femoral and left femoral approach.  This also expedites the procedure making a shorter operative time reducing complications and improving patient safety.  We then began by gaining access to both femoral arteries with US guidance with me working on the patient's right and Dr. Lucky Cowboy working on the patient's left.  The femoral arteries were found to be patent and accessed without difficulty with a needle under ultrasound guidance without difficulty on each side and permanent images were recorded.  We then placed 2 proglide  devices on each side in a pre-close fashion and placed 8 French sheaths.  The patient was then given 5000 units of intravenous heparin.   The Pigtail catheter was placed into the aorta from the right side. Using this image, we selected a 23 x 14 x 12 Main body device.  Over a stiff wire, an 16 French sheath was placed. The main body was then placed through the 16 French sheath. A Kumpe catheter was placed up the left side and a magnified image at the renal arteries was performed. The main body was then deployed just below the lowest renal artery. The Kumpe catheter was used to cannulate the contralateral gate without difficulty and successful cannulation was confirmed by twirling the pigtail catheter in the main body. We then placed a stiff wire and a retrograde arteriogram was performed through the left femoral sheath. We upsized to the 12 [place] sheath for the contralateral limb and a 20 x 10 limb was selected and deployed. The main body deployment was then completed. Based off the angiographic findings, extension limbs were necessary on the right side.  A 18 x 10 extender limb was then advanced up the right side ipsilateral and deployed without difficulty. All junction points and seals zones were treated with the [facility] compliant balloon.   The pigtail catheter was then replaced and a completion angiogram was performed.   No endoleak was detected on completion angiography. The renal arteries were found to be widely patent.  Bilateral internal iliacs are widely patent.   At this point we elected to terminate the procedure. We secured the pro glide devices for hemostasis on the femoral arteries.  The skin incision was closed with a 4-0 Monocryl. Dermabond and pressure dressing were placed. The patient was taken to the recovery room in stable condition having tolerated the procedure well.  COMPLICATIONS: none  CONDITION: stable  Hortencia Pilar  10/11/2020, 1:38 PM

## 2020-10-11 NOTE — Anesthesia Preprocedure Evaluation (Signed)
Anesthesia Evaluation  Patient identified by MRN, date of birth, ID band Patient awake    Reviewed: Allergy & Precautions, H&P , NPO status , reviewed documented beta blocker date and time   Airway Mallampati: II  TM Distance: >3 FB Neck ROM: limited  Mouth opening: Limited Mouth Opening  Dental  (+) Poor Dentition, Chipped, Missing   Pulmonary asthma , COPD, Current Smoker and Patient abstained from smoking.,   Given Duoneb in preop   + rhonchi        Cardiovascular hypertension, + CAD  Normal cardiovascular exam     Neuro/Psych    GI/Hepatic GERD  Controlled,  Endo/Other    Renal/GU      Musculoskeletal  (+) Arthritis ,   Abdominal   Peds  Hematology   Anesthesia Other Findings Past Medical History: No date: AAA (abdominal aortic aneurysm) without rupture (HCC) No date: Aortic atherosclerosis (HCC) No date: Arthritis No date: Asthma No date: Brain bleed ([place])     Comment:  several years ago after falling off ladder No date: CAD (coronary artery disease) No date: COPD (chronic obstructive pulmonary disease) (HCC) No date: Diverticulitis No date: GERD (gastroesophageal reflux disease) No date: Hypertension No date: Tonsil cancer [facility]) Past Surgical History: No date: APPENDECTOMY No date: FRACTURE SURGERY; Left     Comment:  ORIF left forearm No date: HERNIA REPAIR; Left     Comment:  inguinal No date: HERNIA REPAIR     Comment:  abd 09/06/2018: INGUINAL HERNIA REPAIR; Right     Comment:  Procedure: HERNIA REPAIR INGUINAL ADULT, RIGHT;                Surgeon: Herbert Pun, MD;  Location: [facility] ORS;               Service: General;  Laterality: Right; 03/14/2020: PORTA CATH INSERTION; N/A     Comment:  Procedure: PORTA CATH INSERTION;  Surgeon: Algernon Huxley,              MD;  Location: [facility];  Service:               Cardiovascular;  Laterality: N/A; BMI    Body Mass Index: 20.81  kg/m     Reproductive/Obstetrics                             Anesthesia Physical Anesthesia Plan  ASA: IV  Anesthesia Plan: General, General ETT and General LMA   Post-op Pain Management:    Induction: Intravenous  PONV Risk Score and Plan: 1 and Ondansetron, Midazolam and Treatment may vary due to age or medical condition  Airway Management Planned: LMA and Oral ETT  Additional Equipment:   Intra-op Plan:   Post-operative Plan: Extubation in OR  Informed Consent: I have reviewed the patients History and Physical, chart, labs and discussed the procedure including the risks, benefits and alternatives for the proposed anesthesia with the patient or authorized representative who has indicated his/her understanding and acceptance.     Dental Advisory Given  Plan Discussed with: CRNA  Anesthesia Plan Comments: (LMA vs ETT as required)        Anesthesia Quick Evaluation

## 2020-10-12 ENCOUNTER — Encounter: Payer: Self-pay | Admitting: Vascular Surgery

## 2020-10-12 DIAGNOSIS — I714 Abdominal aortic aneurysm, without rupture: Principal | ICD-10-CM

## 2020-10-12 LAB — BASIC METABOLIC PANEL
Anion gap: 8 (ref 5–15)
BUN: 11 mg/dL (ref 8–23)
CO2: 25 mmol/L (ref 22–32)
Calcium: 8.5 mg/dL — ABNORMAL LOW (ref 8.9–10.3)
Chloride: 101 mmol/L (ref 98–111)
Creatinine, Ser: 0.72 mg/dL (ref 0.61–1.24)
GFR, Estimated: >60 mL/min (ref >60)
Glucose, Bld: 122 mg/dL — ABNORMAL HIGH (ref 70–99)
Potassium: 3.7 mmol/L (ref 3.5–5.1)
Sodium: 134 mmol/L — ABNORMAL LOW (ref 135–145)

## 2020-10-12 LAB — CBC
HCT: 38.2 % — ABNORMAL LOW (ref 39.0–52.0)
Hemoglobin: 13.7 g/dL (ref 13.0–17.0)
MCH: 36.2 pg — ABNORMAL HIGH (ref 26.0–34.0)
MCHC: 35.9 g/dL (ref 30.0–36.0)
MCV: 101.1 fL — ABNORMAL HIGH (ref 80.0–100.0)
Platelets: 197 10*3/uL (ref 150–400)
RBC: 3.78 MIL/uL — ABNORMAL LOW (ref 4.22–5.81)
RDW: 12.9 % (ref 11.5–15.5)
WBC: 9.7 10*3/uL (ref 4.0–10.5)
nRBC: 0 % (ref 0.0–0.2)

## 2020-10-12 MED ORDER — OXYCODONE-ACETAMINOPHEN 5-325 MG PO TABS: 1.0000 | ORAL_TABLET | Freq: Four times a day (QID) | ORAL | 0 refills | Status: AC | PRN

## 2020-10-12 MED ORDER — ASPIRIN 81 MG PO TBEC: 81.0000 mg | DELAYED_RELEASE_TABLET | Freq: Every day | ORAL | 3 refills | Status: AC

## 2020-10-12 MED ORDER — ATORVASTATIN CALCIUM 10 MG PO TABS: 10.0000 mg | ORAL_TABLET | Freq: Every day | ORAL | 3 refills | Status: AC

## 2020-10-12 MED ORDER — CLOPIDOGREL BISULFATE 75 MG PO TABS: 75.0000 mg | ORAL_TABLET | Freq: Every day | ORAL | 3 refills | Status: AC

## 2020-10-12 NOTE — Progress Notes (Signed)
Initial Nutrition Assessment  DOCUMENTATION CODES:   Severe malnutrition in context of chronic illness  INTERVENTION:   Recommend strawberry Ensure Enlive po TID, each supplement provides 350 kcal and 20 grams of protein  Recommend Magic cup TID with meals, each supplement provides 290 kcal and 9 grams of protein  Recommend MVI po daily  NUTRITION DIAGNOSIS:   Severe Malnutrition related to cancer and cancer related treatments as evidenced by severe fat depletion,severe muscle depletion.  GOAL:   Patient will meet greater than or equal to 90% of their needs  MONITOR:   PO intake,Supplement acceptance,Labs,Weight trends,Skin,I & O's  REASON FOR ASSESSMENT:   Malnutrition Screening Tool    ASSESSMENT:   65 y.o. 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   Met with pt in room today. Pt is well known to nutrition department and this RD from previous admits. Pt reports good appetite and oral intake pta and in hospital. Pt reports that his G-tube was removed in June and that he has been eating with no issues. Pt reports that he does still occassionally drink strawberry Ensure but not regularly. Per chart, pt appears weight stable since having his tube removed. Recommended for patient to continue daily supplements in between meals to try and gain back some weight. Pt to discharge today. RD will add supplements and MVI if patient does not discharge.    Medications reviewed and include: aspirin, D3, plavix, colace, lovaza, protonix, B12, pepcid  Labs reviewed: Na 134(L)  NUTRITION - FOCUSED PHYSICAL EXAM:  Flowsheet Row Most Recent Value  Orbital  Region Severe depletion  Upper Arm Region Severe depletion  Thoracic and Lumbar Region Severe depletion  Buccal Region Severe depletion  Temple Region Severe depletion  Clavicle Bone Region Severe depletion  Clavicle and Acromion Bone Region Severe depletion  Scapular Bone Region Severe depletion  Dorsal Hand Severe depletion  Patellar Region Severe depletion  Anterior Thigh Region Severe depletion  Posterior Calf Region Severe depletion  Edema (RD Assessment) None  Hair Reviewed  Eyes Reviewed  Mouth Reviewed  Skin Reviewed  Nails Reviewed     Diet Order:   Diet Order            Diet regular Room service appropriate? Yes; Fluid consistency: Thin  Diet effective now                EDUCATION NEEDS:   Education needs have been addressed  Skin:  Skin Assessment: Reviewed RN Assessment  Last BM:  pta  Height:   Ht Readings from Last 1 Encounters:  10/11/20 _0  (1.778 m)    Weight:   Wt Readings from Last 1 Encounters:  10/11/20 65.8 kg    Ideal Body Weight:  75.45 kg  BMI:  Body mass index is 20.81 kg/m.  Estimated Nutritional Needs:   Kcal:  2000-2300kcal/day  Protein:  100-115g/day  Fluid:  2.0-2.3L/day  Koleen Distance MS, RD, LDN Please refer to [facility] for RD and/or RD on-call/weekend/after hours pager

## 2020-10-12 NOTE — Discharge Instructions (Signed)
1) You may shower as of tomorrow. Please keep your groins clean and dry. Gently clean your groin with soap and water. Gently pat dry. 2) please do not engage in any strenuous activity or lifting greater than 10 pounds until you are cleared at your first postoperative visit. 3) please do not drive for at least two weeks.

## 2020-10-12 NOTE — Plan of Care (Signed)
  Problem: Bowel/Gastric: Goal: Gastrointestinal status for postoperative course will improve Outcome: Adequate for Discharge   Problem: Cardiac: Goal: Ability to maintain an adequate cardiac output will improve Outcome: Adequate for Discharge   Problem: Clinical Measurements: Goal: Postoperative complications will be avoided or minimized Outcome: Adequate for Discharge   Problem: Respiratory: Goal: Respiratory status will improve Outcome: Adequate for Discharge   Problem: Skin Integrity: Goal: Demonstration of wound healing without infection will improve Outcome: Adequate for Discharge   Problem: Urinary Elimination: Goal: Ability to achieve and maintain adequate renal perfusion and functioning will improve Outcome: Adequate for Discharge

## 2020-10-12 NOTE — Anesthesia Postprocedure Evaluation (Signed)
Anesthesia Post Note  Patient: Ronnie Ingram  Procedure(s) Performed: ENDOVASCULAR REPAIR/STENT GRAFT (N/A ) PORTA CATH REMOVAL (N/A )  Patient location during evaluation: ICU Anesthesia Type: General Level of consciousness: awake Pain management: pain level controlled Cardiovascular status: stable Anesthetic complications: no   No complications documented.   Last Vitals:  Vitals:   10/12/20 0400 10/12/20 0500  BP: (!) 159/93 (!) 158/90  Pulse: 66 65  Resp: 17 (!) 21  Temp: 37.7 C   SpO2: 96% 97%    Last Pain:  Vitals:   10/12/20 0400  TempSrc: Oral  PainSc: 0-No pain                 Lerry Liner

## 2020-10-12 NOTE — Plan of Care (Signed)
Bilateral femoral sites without complications femstop remains in place 67ml air. No significant events during shift. See epic for further charting  Problem: Bowel/Gastric: Goal: Gastrointestinal status for postoperative course will improve Outcome: Progressing   Problem: Cardiac: Goal: Ability to maintain an adequate cardiac output will improve Outcome: Progressing   Problem: Clinical Measurements: Goal: Postoperative complications will be avoided or minimized Outcome: Progressing   Problem: Respiratory: Goal: Respiratory status will improve Outcome: Progressing   Problem: Skin Integrity: Goal: Demonstration of wound healing without infection will improve Outcome: Progressing

## 2020-10-12 NOTE — Progress Notes (Signed)
Bilateral PAD devices contained zero air and were removed per MD order. Bilateral sites benign with no swelling or hematoma noted. Gauze and tegaderm placed at sites. Foley catheter removed per MD order. Patient tolerated procedure well. Provided a urinal with instructions on voiding. Pt understood all education with no further questions.

## 2020-10-12 NOTE — Progress Notes (Signed)
Patient is alert and oriented and agreeable to discharge. VSS as documented. Peripheral IV removed, gauze dressings placed over site. Bilateral groin sites remain benign. Able to void in urinal post-foley removal. Patient has no complaint of pain at this time.   Patient given discharge education in packet. Educated all new medications, where to pick them up, and how to take them. Explained that follow up appointment is scheduled for one month from now. Patient and significant other verbalized understanding of all discharge education with no further questions.

## 2020-10-12 NOTE — Discharge Summary (Signed)
[facility]    Discharge Summary  Patient ID:  Ronnie Ingram MRN: [id number] DOB/AGE: Nov 16, 1955 65 y.o.  Admit date: 10/11/2020 Discharge date: 10/12/2020 Date of Surgery: 10/11/2020 Surgeon: Surgeon(s): Schnier, Dolores Lory, MD Algernon Huxley, MD  Admission Diagnosis: AAA (abdominal aortic aneurysm) without rupture [facility]) [I71.4]  Discharge Diagnoses:  AAA (abdominal aortic aneurysm) without rupture [facility]) [I71.4]  Secondary Diagnoses: Past Medical History:  Diagnosis Date  . AAA (abdominal aortic aneurysm) without rupture ([place])   . Aortic atherosclerosis ([place])   . Arthritis   . Asthma   . Brain bleed ([place])    several years ago after falling off ladder  . CAD (coronary artery disease)   . COPD (chronic obstructive pulmonary disease) ([place])   . Diverticulitis   . GERD (gastroesophageal reflux disease)   . Hypertension   . Tonsil cancer ([place])    Procedure(s): 1.   Removal of Infuse-a-Port 1. US guidance for vascular access, bilateral femoral arteries 2. Catheter placement into aorta from bilateral femoral approaches 3. Placement of a 23 mm proximal, 14 mm distal, 12 cm length Gore Excluder Endoprosthesis main body right with a 20 mm x 10 cm left contralateral limb 4. Placement of an 18 mm diameter by 10 cm length right iliac extension limb 5. ProGlide closure devices bilateral femoral arteries  Discharged Condition: Good  HPI / Hospital Course:  Ronnie Ingram is a 65 year male who presents with greater than 5cm abdominal aortic aneurysm. The anatomy was suitable for endovascular repair. Risks and benefits of repair in an endovascular fashion were discussed and informed consent was obtained. On October 12, 2018 the patient underwent:        Removal of Infuse-a-Port 6. US guidance for vascular access, bilateral femoral arteries 7. Catheter placement into aorta from bilateral femoral approaches 8. Placement of a 23 mm proximal, 14 mm distal, 12 cm length  Gore Excluder Endoprosthesis main body right with a 20 mm x 10 cm left contralateral limb 9. Placement of an 18 mm diameter by 10 cm length right iliac extension limb 10. ProGlide closure devices bilateral femoral arteries  The patient tolerated the procedure and was transferred from the operating room to the ICU for observation overnight.  Patient's site of surgery was unremarkable.  During the patient's brief stay, his diet was advanced, his Foley was removed and he was urinating independently, his discomfort was controlled to the use of p.o. pain medication and he was ambulating at baseline.  Day of discharge, the patient was afebrile with stable vital signs and essentially unremarkable physical exam.  Physical Exam:  Alert and oriented x3, no acute distress Cardiovascular: Regular rate and rhythm Pulmonary: Clear to auscultation bilaterally Abdomen: Soft, non-tender, non-distended Right groin: Access site, clean dry and intact. Left groin: Access site, clean dry and intact. Extremity: Warm distally toes  Labs: As below  Complications: None  Consults: None  Significant Diagnostic Studies: CBC Lab Results  Component Value Date   WBC 9.7 10/12/2020   HGB 13.7 10/12/2020   HCT 38.2 (L) 10/12/2020   MCV 101.1 (H) 10/12/2020   PLT 197 10/12/2020   BMET    Component Value Date/Time   NA 134 (L) 10/12/2020 0523   NA 136 11/09/2012 2115   K 3.7 10/12/2020 0523   K 3.8 11/09/2012 2115   CL 101 10/12/2020 0523   CL 103 11/09/2012 2115   CO2 25 10/12/2020 0523   CO2 25 11/09/2012 2115   GLUCOSE 122 (  H) 10/12/2020 0523   GLUCOSE 112 (H) 11/09/2012 2115   BUN 11 10/12/2020 0523   BUN 16 11/09/2012 2115   CREATININE 0.72 10/12/2020 0523   CREATININE 0.92 11/09/2012 2115   CALCIUM 8.5 (L) 10/12/2020 0523   CALCIUM 9.1 11/09/2012 2115   GFRNONAA >60 10/12/2020 0523   GFRNONAA >60 11/09/2012 2115   GFRAA >60 03/26/2020 0815   GFRAA >60 11/09/2012 2115   COAG Lab Results   Component Value Date   INR 1.0 10/09/2020   Disposition:  Discharge to :Home  Allergies as of 10/12/2020   No Known Allergies     Medication List    TAKE these medications   aspirin 81 MG EC tablet Take 1 tablet (81 mg total) by mouth daily at 6 (six) AM. Swallow whole. Start taking on: October 13, 2020   atorvastatin 10 MG tablet Commonly known as: LIPITOR Take 1 tablet (10 mg total) by mouth daily.   cholecalciferol 25 MCG (1000 UNIT) tablet Commonly known as: VITAMIN D3 Take 1,000 Units by mouth daily.   clopidogrel 75 MG tablet Commonly known as: PLAVIX Take 1 tablet (75 mg total) by mouth daily at 6 (six) AM. Start taking on: October 13, 2020   Fish Oil 1000 MG Caps Take 1,000 mg by mouth daily.   gabapentin 300 MG capsule Commonly known as: NEURONTIN Take 300 mg by mouth daily as needed (pain).   montelukast 10 MG tablet Commonly known as: SINGULAIR Take 10 mg by mouth daily.   omeprazole 20 MG tablet Commonly known as: PRILOSEC OTC Take 20 mg by mouth daily.   oxyCODONE-acetaminophen 5-325 MG tablet Commonly known as: PERCOCET/ROXICET Take 1 tablet by mouth every 6 (six) hours as needed for moderate pain.   tiotropium 18 MCG inhalation capsule Commonly known as: SPIRIVA Place 18 mcg into inhaler and inhale daily.   vitamin B-12 1000 MCG tablet Commonly known as: CYANOCOBALAMIN Take 1,000 mcg by mouth daily.      Verbal and written Discharge instructions given to the patient. Wound care per Discharge AVS  Follow-up Information    Schnier, Dolores Lory, MD Follow up in 1 month(s).   Specialties: Vascular Surgery, Cardiology, Radiology, Vascular Surgery Why: Can see Schnier or Arna Medici. Will need EVAR with visit. Contact information: [place] [address] [phone]        Schnier, Dolores Lory, MD In 1 month.   Specialties: Vascular Surgery, Cardiology, Radiology, Vascular Surgery Why: APPOINTMENT MAY 23,2022 [facility] 07:30AM PLEASE  DO NOT DRINK NOTHING AFTER 12 MIDNIGHT MAY 22 Contact information: [place] [address] [phone]              Signed: Sela Hua, PA-C  10/12/2020, 1:27 PM

## 2020-10-12 NOTE — Progress Notes (Signed)
Patient up to chair independently without difficulty. IVF stopped and saline locked per order. Nasal cannula discontinued. TV remote and call light within reach, patient instructed to call RN for any needs.

## 2020-10-15 ENCOUNTER — Encounter: Payer: Self-pay | Admitting: Vascular Surgery

## 2020-11-09 ENCOUNTER — Other Ambulatory Visit (INDEPENDENT_AMBULATORY_CARE_PROVIDER_SITE_OTHER): Payer: Self-pay | Admitting: Vascular Surgery

## 2020-11-09 DIAGNOSIS — I714 Abdominal aortic aneurysm, without rupture, unspecified: Secondary | ICD-10-CM

## 2020-11-09 DIAGNOSIS — Z8679 Personal history of other diseases of the circulatory system: Secondary | ICD-10-CM

## 2020-11-09 DIAGNOSIS — Z9889 Other specified postprocedural states: Secondary | ICD-10-CM

## 2020-11-12 ENCOUNTER — Other Ambulatory Visit (INDEPENDENT_AMBULATORY_CARE_PROVIDER_SITE_OTHER): Payer: Medicare Other

## 2020-11-12 ENCOUNTER — Ambulatory Visit (INDEPENDENT_AMBULATORY_CARE_PROVIDER_SITE_OTHER): Payer: Medicare Other | Admitting: Vascular Surgery

## 2020-11-14 ENCOUNTER — Telehealth: Payer: Self-pay

## 2020-11-14 NOTE — Telephone Encounter (Signed)
Patient scheduled for lung screening CT scan on June 16th at 4:30. he smokes 2 PPD and has chantix but hasn't started. He has medicare still. Please text appt details to penny his significant other, [phone].

## 2020-11-20 ENCOUNTER — Other Ambulatory Visit: Payer: Self-pay | Admitting: *Deleted

## 2020-11-20 DIAGNOSIS — Z122 Encounter for screening for malignant neoplasm of respiratory organs: Secondary | ICD-10-CM

## 2020-11-20 DIAGNOSIS — F17200 Nicotine dependence, unspecified, uncomplicated: Secondary | ICD-10-CM

## 2020-11-20 DIAGNOSIS — Z87891 Personal history of nicotine dependence: Secondary | ICD-10-CM

## 2020-11-20 NOTE — Progress Notes (Signed)
Contacted and scheduled for annual lung screening scan. Patient is a current smoker with a 55 pack year history.

## 2020-11-21 ENCOUNTER — Encounter: Payer: Self-pay | Admitting: Oncology

## 2020-12-06 ENCOUNTER — Ambulatory Visit: Admission: RE | Admit: 2020-12-06 | Payer: Medicare Other | Source: Ambulatory Visit

## 2021-03-26 ENCOUNTER — Other Ambulatory Visit: Payer: Self-pay | Admitting: *Deleted

## 2021-03-26 DIAGNOSIS — C109 Malignant neoplasm of oropharynx, unspecified: Secondary | ICD-10-CM

## 2021-03-29 ENCOUNTER — Inpatient Hospital Stay: Payer: Medicare Other

## 2021-03-29 ENCOUNTER — Telehealth: Payer: Self-pay | Admitting: Oncology

## 2021-03-29 ENCOUNTER — Inpatient Hospital Stay: Payer: Medicare Other | Admitting: Nurse Practitioner

## 2021-03-29 NOTE — Telephone Encounter (Signed)
Patient missed appointment today. Left him a VM with rescheduled day and time. Sending appointment reminder in the mail also.

## 2021-04-11 ENCOUNTER — Inpatient Hospital Stay (HOSPITAL_BASED_OUTPATIENT_CLINIC_OR_DEPARTMENT_OTHER): Payer: Medicare Other | Admitting: Oncology

## 2021-04-11 ENCOUNTER — Other Ambulatory Visit: Payer: Self-pay

## 2021-04-11 ENCOUNTER — Encounter: Payer: Self-pay | Admitting: Oncology

## 2021-04-11 ENCOUNTER — Inpatient Hospital Stay: Payer: Medicare Other | Attending: Nurse Practitioner

## 2021-04-11 VITALS — BP 153/98 | HR 78 | Temp 97.8°F | Resp 16 | Wt 144.7 lb

## 2021-04-11 DIAGNOSIS — F17210 Nicotine dependence, cigarettes, uncomplicated: Secondary | ICD-10-CM | POA: Diagnosis not present

## 2021-04-11 DIAGNOSIS — F17200 Nicotine dependence, unspecified, uncomplicated: Secondary | ICD-10-CM

## 2021-04-11 DIAGNOSIS — C109 Malignant neoplasm of oropharynx, unspecified: Secondary | ICD-10-CM | POA: Insufficient documentation

## 2021-04-11 DIAGNOSIS — R682 Dry mouth, unspecified: Secondary | ICD-10-CM | POA: Diagnosis not present

## 2021-04-11 LAB — COMPREHENSIVE METABOLIC PANEL
ALT: 67 U/L — ABNORMAL HIGH (ref 0–44)
AST: 64 U/L — ABNORMAL HIGH (ref 15–41)
Albumin: 4.2 g/dL (ref 3.5–5.0)
Alkaline Phosphatase: 75 U/L (ref 38–126)
Anion gap: 7 (ref 5–15)
BUN: 14 mg/dL (ref 8–23)
CO2: 28 mmol/L (ref 22–32)
Calcium: 8.9 mg/dL (ref 8.9–10.3)
Chloride: 101 mmol/L (ref 98–111)
Creatinine, Ser: 0.75 mg/dL (ref 0.61–1.24)
GFR, Estimated: >60 mL/min (ref >60)
Glucose, Bld: 116 mg/dL — ABNORMAL HIGH (ref 70–99)
Potassium: 4.4 mmol/L (ref 3.5–5.1)
Sodium: 136 mmol/L (ref 135–145)
Total Bilirubin: 0.7 mg/dL (ref 0.3–1.2)
Total Protein: 7.3 g/dL (ref 6.5–8.1)

## 2021-04-11 LAB — CBC WITH DIFFERENTIAL/PLATELET
Abs Immature Granulocytes: 0.01 10*3/uL (ref 0.00–0.07)
Basophils Absolute: 0.1 10*3/uL (ref 0.0–0.1)
Basophils Relative: 1 %
Eosinophils Absolute: 0.1 10*3/uL (ref 0.0–0.5)
Eosinophils Relative: 2 %
HCT: 41.7 % (ref 39.0–52.0)
Hemoglobin: 14.9 g/dL (ref 13.0–17.0)
Immature Granulocytes: 0 %
Lymphocytes Relative: 22 %
Lymphs Abs: 1.2 10*3/uL (ref 0.7–4.0)
MCH: 36.8 pg — ABNORMAL HIGH (ref 26.0–34.0)
MCHC: 35.7 g/dL (ref 30.0–36.0)
MCV: 103 fL — ABNORMAL HIGH (ref 80.0–100.0)
Monocytes Absolute: 0.7 10*3/uL (ref 0.1–1.0)
Monocytes Relative: 12 %
Neutro Abs: 3.4 10*3/uL (ref 1.7–7.7)
Neutrophils Relative %: 63 %
Platelets: 223 10*3/uL (ref 150–400)
RBC: 4.05 MIL/uL — ABNORMAL LOW (ref 4.22–5.81)
RDW: 12.7 % (ref 11.5–15.5)
WBC: 5.4 10*3/uL (ref 4.0–10.5)
nRBC: 0 % (ref 0.0–0.2)

## 2021-04-11 NOTE — Progress Notes (Signed)
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  CT soft tissue neck showed a hyperenhancement of left palatine tonsil measuring 2.8 cm.  Enlarged left level 2A lymph node measuring 3.4 cm in long axis and a smaller but conspicuous level 2B lymph node measuring 9 mm.  PET CT scan showed marked hypermetabolism in the left tonsillar region with an SUV of 8.8.  SUV 4.2 at the level 2 lymph node.  The smaller level 2B lymph node is superimposed on the dominant necrotic node.  4.8 x 4.4 cm abdominal aortic aneurysm.  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  Patient was seen by radiation oncology Dr. Baruch Gouty and plan is for concurrent chemoradiation.    Treatment interrupted by worsening mucositis requiring hospitalization and feeding tube placement.  Patient has completed 6 weekly cycles of cisplatin chemotherapy and concurrent chemoradiation in November 2021  PET scan done in March 2022 showed resolution of left cervical adenopathy and reduction in the size and activity of the tonsillar mass.  Infrarenal abdominal aortic aneurysm 5.2 cm in transverse dimension for which patient was referred to vascular surgery.   Interval history-had abdominal aortic  aneurysm repair in April 2022 by Dr. Delana Meyer with great tolerance.  Overall eating and drinking well.  Denies any new throat pain or dysphagia.  Reports new cavity where he had radiation.  States he will have to have that tooth pulled soon.  Reports dry mouth and change in taste since treatment.  ECOG PS- 1 Pain scale- 0  Review of systems- Review of Systems  Constitutional:  Positive for malaise/fatigue. Negative for chills, fever and weight loss.  HENT:  Negative for congestion, ear discharge and nosebleeds.          Dry mouth  Eyes:  Negative for blurred vision.  Respiratory:  Negative for cough, hemoptysis, sputum production, shortness of breath and wheezing.   Cardiovascular:  Negative for chest pain, palpitations, orthopnea and claudication.  Gastrointestinal:  Negative for abdominal pain, blood in stool, constipation, diarrhea, heartburn, melena, nausea and vomiting.  Genitourinary:  Negative for dysuria, flank pain, frequency, hematuria and urgency.  Musculoskeletal:  Negative for back pain, joint pain and myalgias.  Skin:  Negative for rash.  Neurological:  Negative for dizziness, tingling, focal weakness, seizures, weakness and headaches.  Endo/Heme/Allergies:  Does not bruise/bleed easily.  Psychiatric/Behavioral:  Negative for depression and suicidal ideas. The patient does not have insomnia.      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[facility])      Past Surgical History:  Procedure Laterality Date   APPENDECTOMY     ENDOVASCULAR REPAIR/STENT GRAFT N/A 10/11/2020   Procedure: ENDOVASCULAR  REPAIR/STENT GRAFT;  Surgeon: Katha Cabal, MD;  Location: [place] [facility];  Service: Cardiovascular;  Laterality: N/A;   FRACTURE SURGERY Left    ORIF left forearm   HERNIA REPAIR Left    inguinal   HERNIA REPAIR     abd   INGUINAL HERNIA REPAIR Right 09/06/2018   Procedure: HERNIA REPAIR INGUINAL ADULT, RIGHT;  Surgeon: Herbert Pun, MD;  Location: [facility];  Service: General;  Laterality: Right;   PORTA CATH INSERTION N/A 03/14/2020   Procedure: PORTA CATH INSERTION;  Surgeon: Algernon Huxley, MD;  Location: [place] [facility];  Service: Cardiovascular;  Laterality: N/A;   PORTA CATH REMOVAL N/A 10/11/2020   Procedure: PORTA CATH REMOVAL;  Surgeon: Katha Cabal, MD;  Location: [place] [facility];  Service: Cardiovascular;  Laterality: N/A;    Social History   Socioeconomic History   Marital status: Significant Other    Spouse name: Not on file   Number of children: Not on file   Years of education: Not on file   Highest education level: Not on file  Occupational History   Not on file  Tobacco Use   Smoking status: Every Day    Packs/day: 1.00    Years: 30.00    Pack years: 30.00    Types: Cigarettes   Smokeless tobacco: Never  Vaping Use   Vaping Use: Never used  Substance and Sexual Activity   Alcohol use: Not Currently    Alcohol/week: 14.0 standard drinks    Types: 14 Cans of beer per week   Drug use: Yes    Types: Marijuana   Sexual activity: Not Currently  Other Topics Concern   Not on file  Social History Narrative   Not on file   Social Determinants of Health   Financial Resource Strain: Not on file  Food Insecurity: Not on file  Transportation Needs: Not on file  Physical Activity: Not on file  Stress: Not on file  Social Connections: Not on file  Intimate Partner Violence: Not on file    Family History  Problem Relation Age of Onset   Lung cancer Mother    Aneurysm Father    AAA (abdominal aortic aneurysm) Father     Hyperlipidemia Father    Hypertension Father    AAA (abdominal aortic aneurysm) Brother      Current Outpatient Medications:    aspirin EC 81 MG EC tablet, Take 1 tablet (81 mg total) by mouth daily at 6 (six) AM. Swallow whole., Disp: 90 tablet, Rfl: 3   atorvastatin (LIPITOR) 10 MG tablet, Take 1 tablet (10 mg total) by mouth daily., Disp: 90 tablet, Rfl: 3   cholecalciferol (VITAMIN D3) 25 MCG (1000 UNIT) tablet, Take 1,000 Units by mouth daily., Disp: , Rfl:    clopidogrel (PLAVIX) 75 MG tablet, Take 1 tablet (75 mg total) by mouth daily at 6 (six) AM., Disp: 90 tablet, Rfl: 3   gabapentin (NEURONTIN) 300 MG capsule, Take 300 mg by mouth daily as needed (pain)., Disp: , Rfl:    montelukast (SINGULAIR) 10 MG tablet, Take 10 mg by mouth daily., Disp: , Rfl:    Omega-3 Fatty Acids (FISH OIL) 1000 MG CAPS, Take 1,000 mg by mouth daily., Disp: ,  Rfl:    omeprazole (PRILOSEC OTC) 20 MG tablet, Take 20 mg by mouth daily., Disp: , Rfl:    oxyCODONE-acetaminophen (PERCOCET/ROXICET) 5-325 MG tablet, Take 1 tablet by mouth every 6 (six) hours as needed for moderate pain., Disp: 15 tablet, Rfl: 0   tiotropium (SPIRIVA) 18 MCG inhalation capsule, Place 18 mcg into inhaler and inhale daily., Disp: , Rfl:    vitamin B-12 (CYANOCOBALAMIN) 1000 MCG tablet, Take 1,000 mcg by mouth daily., Disp: , Rfl:  No current facility-administered medications for this visit.  Facility-Administered Medications Ordered in Other Visits:    sodium chloride flush (NS) 0.9 % injection 10 mL, 10 mL, Intravenous, PRN, Lloyd Huger, MD, 10 mL at 04/23/20 0850  Physical exam:  There were no vitals filed for this visit.  Physical Exam Constitutional:      Appearance: Normal appearance.  HENT:     Head: Normocephalic and atraumatic.  Eyes:     Pupils: Pupils are equal, round, and reactive to light.  Cardiovascular:     Rate and Rhythm: Normal rate and regular rhythm.     Heart sounds: Normal heart sounds. No  murmur heard. Pulmonary:     Effort: Pulmonary effort is normal.     Breath sounds: Normal breath sounds. No wheezing.  Abdominal:     General: Bowel sounds are normal. There is no distension.     Palpations: Abdomen is soft.     Tenderness: There is no abdominal tenderness.  Musculoskeletal:        General: Normal range of motion.     Cervical back: Normal range of motion.  Lymphadenopathy:     Head:     Right side of head: No submandibular or tonsillar adenopathy.     Left side of head: No submandibular or tonsillar adenopathy.     Cervical: No cervical adenopathy.  Skin:    General: Skin is warm and dry.     Findings: No rash.  Neurological:     Mental Status: He is alert and oriented to person, place, and time.  Psychiatric:        Judgment: Judgment normal.     CMP Latest Ref Rng & Units 10/12/2020  Glucose 70 - 99 mg/dL 122(H)  BUN 8 - 23 mg/dL 11  Creatinine 0.61 - 1.24 mg/dL 0.72  Sodium 135 - 145 mmol/L 134(L)  Potassium 3.5 - 5.1 mmol/L 3.7  Chloride 98 - 111 mmol/L 101  CO2 22 - 32 mmol/L 25  Calcium 8.9 - 10.3 mg/dL 8.5(L)  Total Protein 6.5 - 8.1 g/dL -  Total Bilirubin 0.3 - 1.2 mg/dL -  Alkaline Phos 38 - 126 U/L -  AST 15 - 41 U/L -  ALT 0 - 44 U/L -   CBC Latest Ref Rng & Units 04/11/2021  WBC 4.0 - 10.5 K/uL 5.4  Hemoglobin 13.0 - 17.0 g/dL 14.9  Hematocrit 39.0 - 52.0 % 41.7  Platelets 150 - 400 K/uL 223    No images are attached to the encounter.  No results found.    Assessment and plan- Patient is a 65 y.o. male with history of stage I HPV positive squamous cell carcinoma of the oropharynx here for routine follow-up.  He is almost 1 year out from concurrent chemoradiation and overall doing well.  Had a AAA repaired on 10/09/2020 and his port removed.  No clinical signs and symptoms of recurrence based on today's exam.  He will continue follow-up with Dr. Pryor Ochoa as expected.  Labs  from today are unremarkable.  Dry mouth-secondary to  radiation treatment.  Recommend OTC saliva replacement hard candies.  Good oral hygiene.  Continue dental care.   Disposition-RTC in 6 months to see Dr. Janese Banks with labs.  I spent 25 minutes dedicated to the care of this patient (face-to-face and non-face-to-face) on the date of the encounter to include what is described in the assessment and plan.  Visit Diagnosis 1. Squamous cell carcinoma of oropharynx ([place])   2. Current every day smoker     Faythe Casa, NP 04/11/2021 1:48 PM

## 2021-04-11 NOTE — Progress Notes (Signed)
Survivorship Care Plan visit completed.  Treatment summary reviewed and given to patient.  ASCO answers booklet reviewed and given to patient.  CARE program and Cancer Transitions discussed with patient along with other resources cancer center offers to patients and caregivers.  Patient verbalized understanding.    

## 2021-05-24 IMAGING — CT CT NECK W/ CM
4 of 5 series · 14 of 33 positions shown, 16 images · IV contrast (omnipaque)
Comparison: Chest CT 11/17/2019.  Cervical spine CT 11/09/2012.

CLINICAL DATA: 64-year-old male with slightly painful left
submandibular region mass reportedly x3 weeks. Smoker.

EXAM:
CT NECK WITH CONTRAST
TECHNIQUE: Multidetector CT imaging of the neck was performed using the
standard protocol following the bolus administration of intravenous
contrast.
CONTRAST:  75mL OMNIPAQUE IOHEXOL 300 MG/ML  SOLN

[Series 2: axial neck neck (person_name) 2.00 · axial · 0.64mm/px · z∈[-670,-584]mm · 2 of 130 slices shown]
[im 44/130  bone]
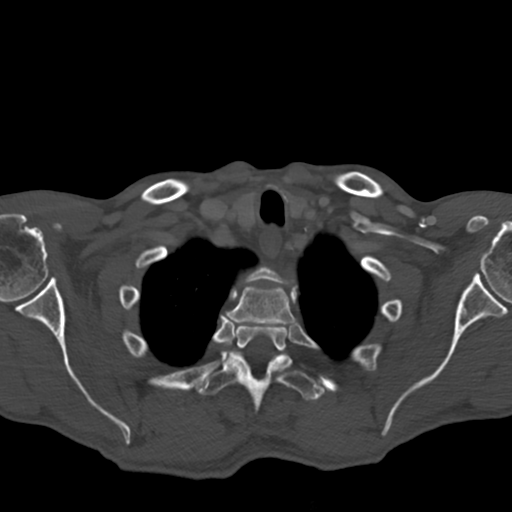
[im 87/130  bone]
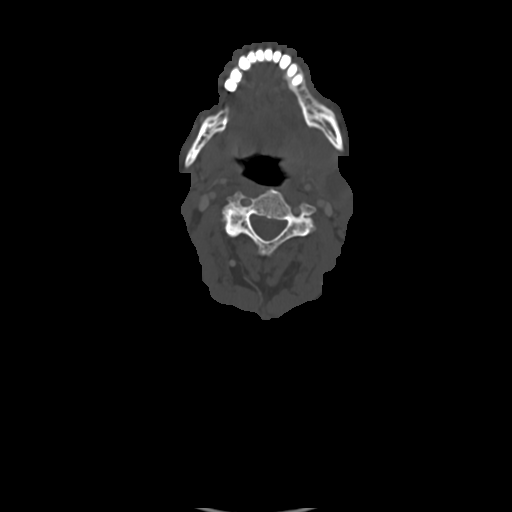

[Series 4: coronal neck neck (person_name) 2.00 cor · coronal · 0.64mm/px · 3 of 163 slices shown]
[im 58/163  bone]
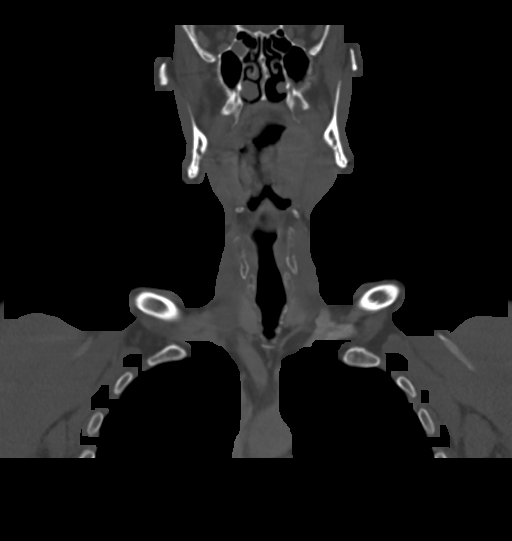
[im 74/163  bone]
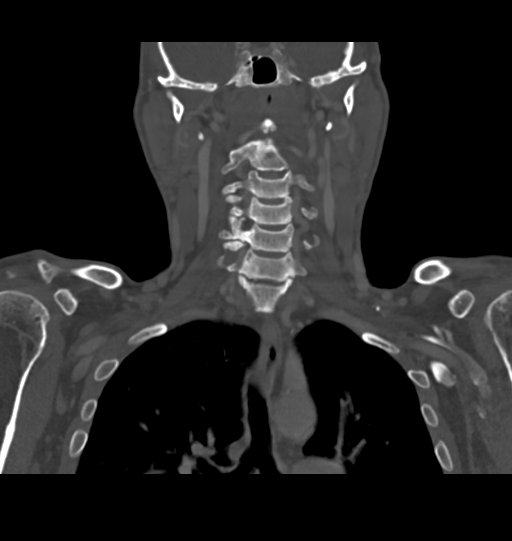
[im 89/163  bone]
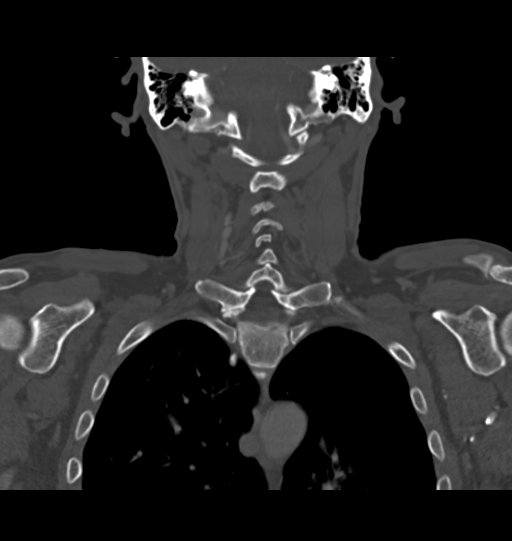

[Series 6: sagittal neck neck (person_name) 2.00 sag · sagittal · 0.64mm/px · 5 of 163 slices shown, 6 images]
[im 55/163  bone]
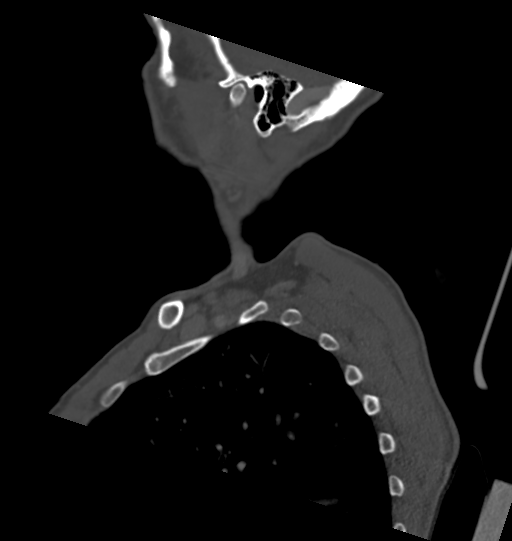
[im 68/163  bone]
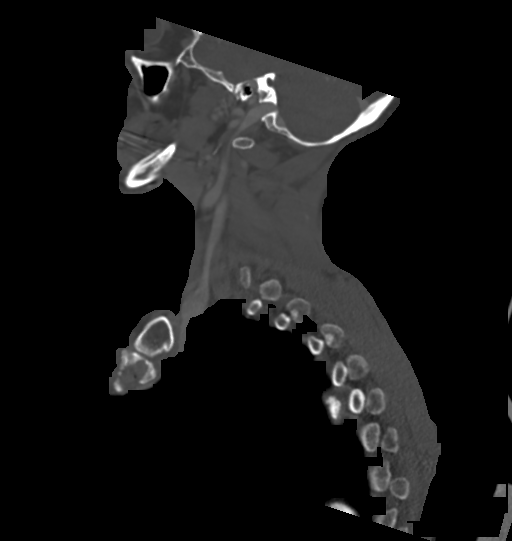
[im 82/163  soft-tissue]
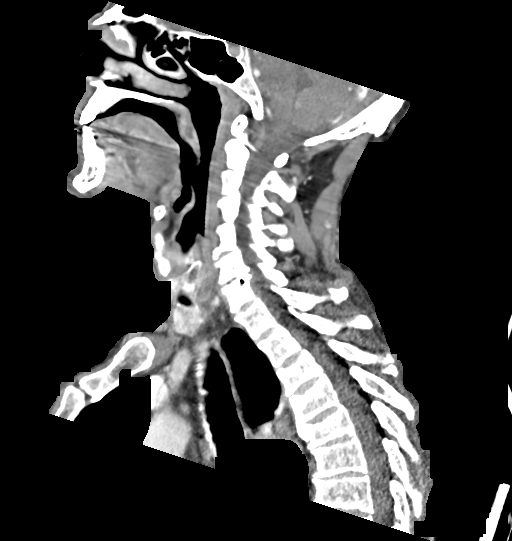
[im 82/163  bone]
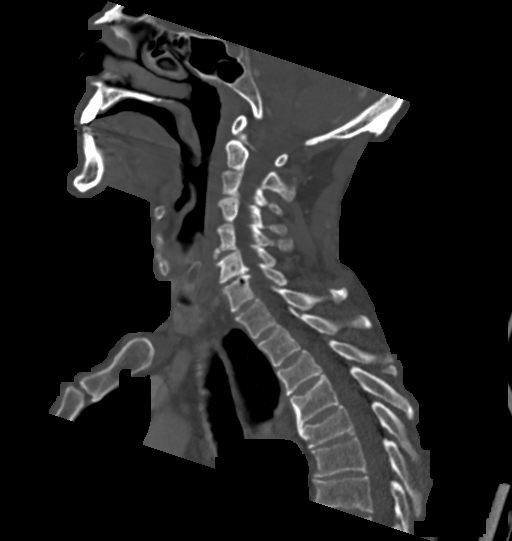
[im 95/163  bone]
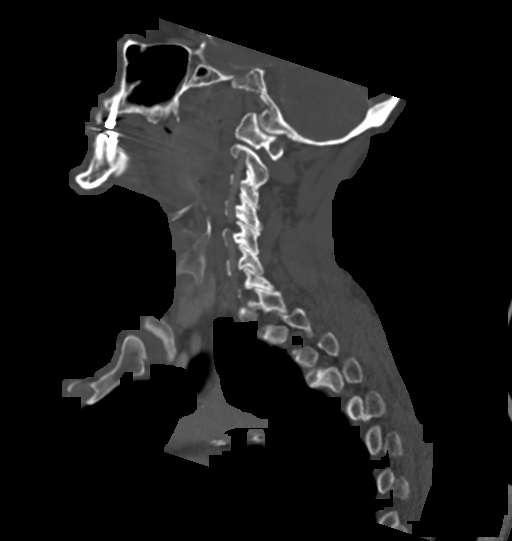
[im 109/163  bone]
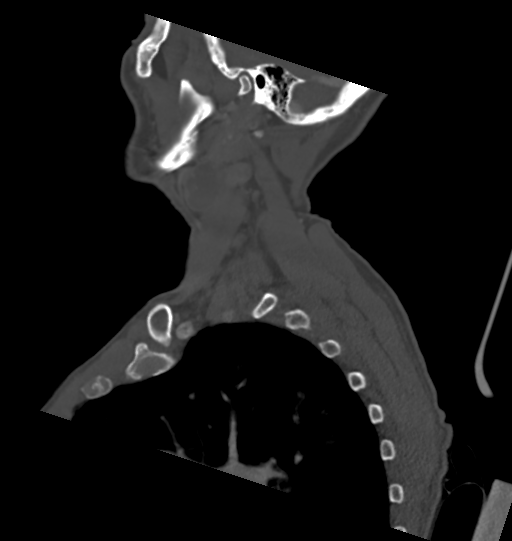

[Series 8: ax oropharynx neck neck (person_name) 2.00 ax · axial · 0.64mm/px · z∈[-779,-585]mm · 4 of 172 slices shown, 5 images]
[im 35/172  soft-tissue]
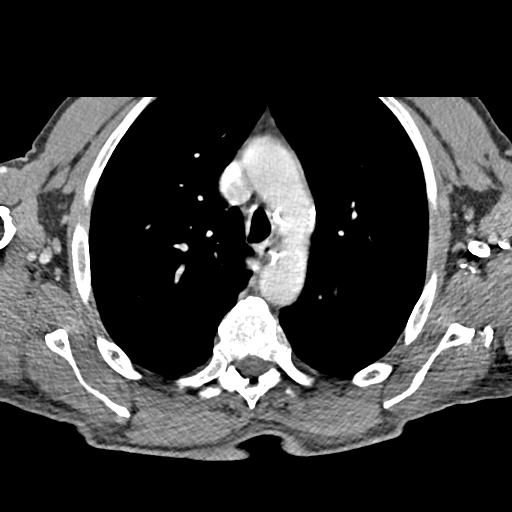
[im 35/172  bone]
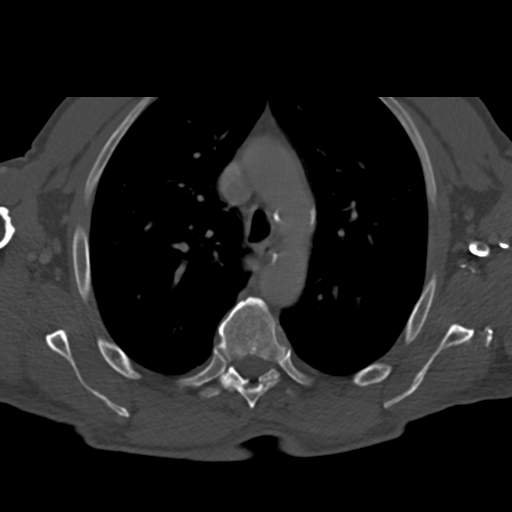
[im 69/172  bone]
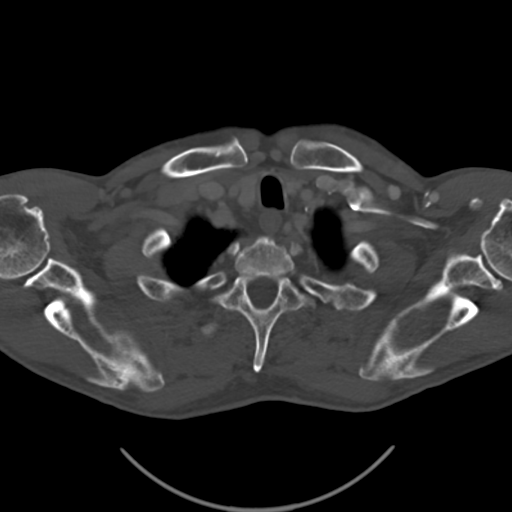
[im 103/172  bone]
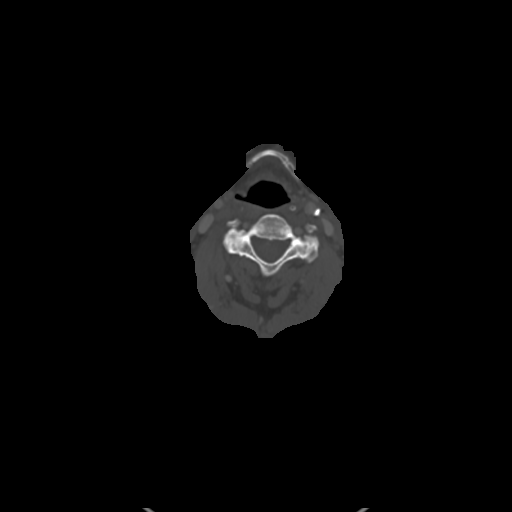
[im 137/172  bone]
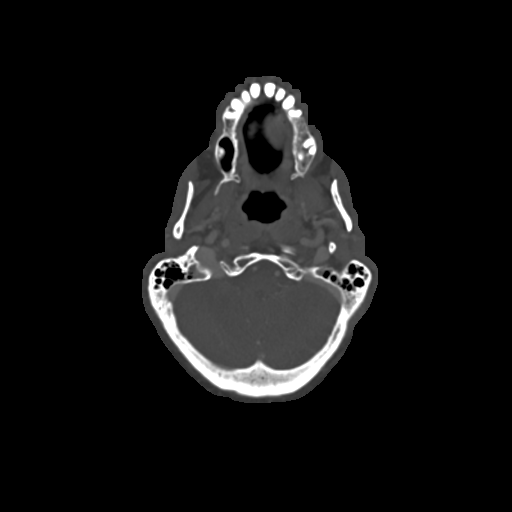

[14 of 33 positions shown; findings below may reference images not displayed]

FINDINGS: Pharynx and larynx: Mild motion artifact. The larynx is within
normal limits. Hypopharynx within normal limits.

At the level of the oropharynx and soft palate there is asymmetric
enlargement and hyperenhancement of the left palatine tonsil (series
2, image 34 and coronal image 63) measuring roughly 28 mm diameter.
The left parapharyngeal space is effaced.

The nasopharynx, the right parapharyngeal space, and the
retropharyngeal space remain within normal limits.

Salivary glands: Negative sublingual space. Submandibular glands
appear within normal limits, aside from some extrinsic mass effect
on the left, see lymph node section below. Likewise, parotid glands
appear to remain within normal limits.

Thyroid: Negative.

Lymph nodes: Mixed cystic and solid enlarged left level 2A lymph
node appears to correspond to the palpable abnormality as seen on
series 2, image 48. This measures about 34 mm long axis, and some of
the margins are indistinct. No regional inflammation.

There is a smaller but conspicuous left level 2 B lymph node
measuring 9 mm short axis on series 2, image 38 and also seen on
coronal image 77.

Other bilateral cervical nodes appear more symmetric and within
normal limits.

Vascular: Major vascular structures in the neck and at the skull
base remain patent including the left IJ. Tortuous cervical ICAs.
Carotid bifurcation calcified plaque. Calcified atherosclerosis at
the skull base.

Limited intracranial: Negative.

Visualized orbits: Negative.

Mastoids and visualized paranasal sinuses: Scattered paranasal sinus
mucosal thickening. Tympanic cavities and mastoids remain clear.

Skeleton: Mild motion artifact at the mandible. Bilateral posterior
mandible dentition is absent. No acute or suspicious osseous lesion
identified. Widespread cervical spine facet arthropathy.

Upper chest: Large lung volumes. Visible upper lungs are clear.
Calcified aortic atherosclerosis. No superior mediastinal
lymphadenopathy.
IMPRESSION: 1. The left neck palpable abnormality corresponds to a malignant
appearing left level 2A lymph node measuring up to 3.4 cm.
Associated asymmetric enlargement of the left palatine tonsil,
roughly 2.8 cm. And small but suspicious 0.9 cm left 2B lymph node.
This constellation is most suggestive of oropharyngeal squamous cell
carcinoma with unilateral lymph node involvement.

2. Aortic Atherosclerosis ([id number]).

## 2021-10-10 ENCOUNTER — Other Ambulatory Visit: Payer: Self-pay

## 2021-10-10 DIAGNOSIS — C109 Malignant neoplasm of oropharynx, unspecified: Secondary | ICD-10-CM

## 2021-10-11 ENCOUNTER — Inpatient Hospital Stay: Payer: Medicare Other | Attending: Oncology

## 2021-10-11 ENCOUNTER — Encounter: Payer: Self-pay | Admitting: Oncology

## 2021-10-11 ENCOUNTER — Inpatient Hospital Stay: Payer: Medicare Other | Admitting: Oncology

## 2022-01-13 ENCOUNTER — Other Ambulatory Visit: Payer: Self-pay

## 2022-01-13 ENCOUNTER — Encounter: Payer: Self-pay | Admitting: *Deleted

## 2022-01-13 NOTE — Progress Notes (Deleted)
NO SHOW

## 2022-01-14 ENCOUNTER — Ambulatory Visit: Payer: Medicare Other | Admitting: Cardiovascular Disease

## 2022-01-14 DIAGNOSIS — I714 Abdominal aortic aneurysm, without rupture, unspecified: Secondary | ICD-10-CM

## 2022-01-14 DIAGNOSIS — I10 Essential (primary) hypertension: Secondary | ICD-10-CM

## 2022-01-14 DIAGNOSIS — J449 Chronic obstructive pulmonary disease, unspecified: Secondary | ICD-10-CM

## 2022-01-14 DIAGNOSIS — F17200 Nicotine dependence, unspecified, uncomplicated: Secondary | ICD-10-CM

## 2022-01-14 DIAGNOSIS — I25118 Atherosclerotic heart disease of native coronary artery with other forms of angina pectoris: Secondary | ICD-10-CM

## 2022-01-15 ENCOUNTER — Encounter: Payer: Self-pay | Admitting: Cardiovascular Disease

## 2022-04-21 ENCOUNTER — Encounter (INDEPENDENT_AMBULATORY_CARE_PROVIDER_SITE_OTHER): Payer: Self-pay

## 2023-02-10 DIAGNOSIS — L819 Disorder of pigmentation, unspecified: Secondary | ICD-10-CM | POA: Diagnosis not present

## 2023-02-10 DIAGNOSIS — J439 Emphysema, unspecified: Secondary | ICD-10-CM | POA: Diagnosis not present

## 2023-02-10 DIAGNOSIS — J449 Chronic obstructive pulmonary disease, unspecified: Secondary | ICD-10-CM | POA: Diagnosis not present

## 2023-02-10 DIAGNOSIS — Z95828 Presence of other vascular implants and grafts: Secondary | ICD-10-CM | POA: Diagnosis not present

## 2023-02-10 DIAGNOSIS — Z1331 Encounter for screening for depression: Secondary | ICD-10-CM | POA: Diagnosis not present

## 2023-02-10 DIAGNOSIS — F1020 Alcohol dependence, uncomplicated: Secondary | ICD-10-CM | POA: Diagnosis not present

## 2023-02-10 DIAGNOSIS — C069 Malignant neoplasm of mouth, unspecified: Secondary | ICD-10-CM | POA: Diagnosis not present

## 2023-02-10 DIAGNOSIS — I10 Essential (primary) hypertension: Secondary | ICD-10-CM | POA: Diagnosis not present

## 2023-02-10 DIAGNOSIS — Z1389 Encounter for screening for other disorder: Secondary | ICD-10-CM | POA: Diagnosis not present

## 2023-06-29 DIAGNOSIS — Z0131 Encounter for examination of blood pressure with abnormal findings: Secondary | ICD-10-CM | POA: Diagnosis not present

## 2023-06-29 DIAGNOSIS — F1020 Alcohol dependence, uncomplicated: Secondary | ICD-10-CM | POA: Diagnosis not present

## 2023-06-29 DIAGNOSIS — Z1389 Encounter for screening for other disorder: Secondary | ICD-10-CM | POA: Diagnosis not present

## 2023-06-29 DIAGNOSIS — Z0000 Encounter for general adult medical examination without abnormal findings: Secondary | ICD-10-CM | POA: Diagnosis not present

## 2023-06-29 DIAGNOSIS — Z0130 Encounter for examination of blood pressure without abnormal findings: Secondary | ICD-10-CM | POA: Diagnosis not present

## 2023-06-29 DIAGNOSIS — L819 Disorder of pigmentation, unspecified: Secondary | ICD-10-CM | POA: Diagnosis not present

## 2023-06-29 DIAGNOSIS — R790 Abnormal level of blood mineral: Secondary | ICD-10-CM | POA: Diagnosis not present

## 2023-07-29 ENCOUNTER — Inpatient Hospital Stay: Payer: Medicare HMO

## 2023-07-29 ENCOUNTER — Encounter: Payer: Self-pay | Admitting: Oncology

## 2023-07-29 ENCOUNTER — Inpatient Hospital Stay: Payer: Medicare Other | Attending: Oncology | Admitting: Oncology

## 2023-07-29 DIAGNOSIS — R5383 Other fatigue: Secondary | ICD-10-CM | POA: Insufficient documentation

## 2023-07-29 DIAGNOSIS — F17210 Nicotine dependence, cigarettes, uncomplicated: Secondary | ICD-10-CM | POA: Insufficient documentation

## 2023-07-29 DIAGNOSIS — Z801 Family history of malignant neoplasm of trachea, bronchus and lung: Secondary | ICD-10-CM | POA: Insufficient documentation

## 2023-07-29 LAB — COMPREHENSIVE METABOLIC PANEL
ALT: 56 U/L — ABNORMAL HIGH (ref 0–44)
AST: 48 U/L — ABNORMAL HIGH (ref 15–41)
Albumin: 4 g/dL (ref 3.5–5.0)
Alkaline Phosphatase: 66 U/L (ref 38–126)
Anion gap: 9 (ref 5–15)
BUN: 21 mg/dL (ref 8–23)
CO2: 26 mmol/L (ref 22–32)
Calcium: 9.1 mg/dL (ref 8.9–10.3)
Chloride: 100 mmol/L (ref 98–111)
Creatinine, Ser: 0.85 mg/dL (ref 0.61–1.24)
GFR, Estimated: >60 mL/min (ref >60)
Glucose, Bld: 113 mg/dL — ABNORMAL HIGH (ref 70–99)
Potassium: 4.4 mmol/L (ref 3.5–5.1)
Sodium: 135 mmol/L (ref 135–145)
Total Bilirubin: 0.7 mg/dL (ref 0.0–1.2)
Total Protein: 7.1 g/dL (ref 6.5–8.1)

## 2023-07-29 LAB — CBC WITH DIFFERENTIAL/PLATELET
Abs Immature Granulocytes: 0.02 K/uL (ref 0.00–0.07)
Basophils Absolute: 0.1 K/uL (ref 0.0–0.1)
Basophils Relative: 1 %
Eosinophils Absolute: 0.1 K/uL (ref 0.0–0.5)
Eosinophils Relative: 2 %
HCT: 42.7 % (ref 39.0–52.0)
Hemoglobin: 15.1 g/dL (ref 13.0–17.0)
Immature Granulocytes: 0 %
Lymphocytes Relative: 21 %
Lymphs Abs: 1.6 K/uL (ref 0.7–4.0)
MCH: 35.1 pg — ABNORMAL HIGH (ref 26.0–34.0)
MCHC: 35.4 g/dL (ref 30.0–36.0)
MCV: 99.3 fL (ref 80.0–100.0)
Monocytes Absolute: 1 K/uL (ref 0.1–1.0)
Monocytes Relative: 13 %
Neutro Abs: 4.9 K/uL (ref 1.7–7.7)
Neutrophils Relative %: 63 %
Platelets: 285 K/uL (ref 150–400)
RBC: 4.3 MIL/uL (ref 4.22–5.81)
RDW: 12.9 % (ref 11.5–15.5)
WBC: 7.6 K/uL (ref 4.0–10.5)
nRBC: 0 % (ref 0.0–0.2)

## 2023-07-29 LAB — IRON AND TIBC
Iron: 266 ug/dL — ABNORMAL HIGH (ref 45–182)
Saturation Ratios: 89 % — ABNORMAL HIGH (ref 17.9–39.5)
TIBC: 300 ug/dL (ref 250–450)
UIBC: 34 ug/dL

## 2023-07-29 LAB — TSH: TSH: 4.107 u[IU]/mL (ref 0.350–4.500)

## 2023-07-30 ENCOUNTER — Encounter: Payer: Self-pay | Admitting: Oncology

## 2023-07-30 LAB — FERRITIN: Ferritin: <1410 ng/mL — ABNORMAL HIGH (ref 24–336)

## 2023-07-30 NOTE — Progress Notes (Signed)
 Hematology/Oncology Consult note [facility]  Telephone:([phone] [phone]  Patient Care Team: Norvell Boyer, DEVONNA as PCP - General (Physician Assistant) Melanee Annah BROCKS, MD as Consulting Physician (Hematology and Oncology) Lenn Aran, MD as Referring Physician (Radiation Oncology) Jordis Laneta FALCON, MD as Consulting Physician (General Surgery)   Name of the patient: Ronnie Ingram  [id number]  [birth year]   Date of visit: 07/30/23  Diagnosis- 1.  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  PET CT scan in March 2022 showed resolution of adenopathy and decrease in the size and activity of the tonsillar mass.  He also had infrarenal AAA 5.2 cm for which she underwent endovascular repair by vascular surgery subsequently.  Patient is now being referred to us  for new diagnosis of hereditary hemochromatosis.  Labs from 06/29/2023 showed H&H of 16.1/45.5 with an MCV of 101.  White count 8.1, platelets 310.  Ferritin levels elevated at 2211 with an iron saturation of 94%.  CMP showed mildly elevated AST and ALT of 46 and 51 total bilirubin was normal.  Hepatitis A, B, and C testing negative.  Hereditary hemochromatosis testing showed homozygosity for C282Y.  Last CT abdomen from 2022 did not show any evidence of cirrhosis.    Interval history- Patient admits to drinking alcohol about 3-4 beers daily.  Denies any family history of cirrhosis or liver disease.  ECOG PS- 1 Pain scale- 0   Review of systems- Review of Systems   Constitutional:  Positive for malaise/fatigue. Negative for chills, fever and weight loss.  HENT:  Negative for congestion, ear discharge and nosebleeds.   Eyes:  Negative for blurred vision.  Respiratory:  Negative for cough, hemoptysis, sputum production, shortness of breath and wheezing.   Cardiovascular:  Negative for chest pain, palpitations, orthopnea and claudication.  Gastrointestinal:  Negative for abdominal pain, blood in stool, constipation, diarrhea, heartburn, melena, nausea and vomiting.  Genitourinary:  Negative for dysuria, flank pain, frequency, hematuria and urgency.  Musculoskeletal:  Negative for back pain, joint pain and myalgias.  Skin:  Negative for rash.  Neurological:  Negative for dizziness, tingling, focal weakness, seizures, weakness and headaches.  Endo/Heme/Allergies:  Does not bruise/bleed easily.  Psychiatric/Behavioral:  Negative for depression and suicidal ideas. The patient does not have insomnia.       No Known Allergies   Past Medical History:  Diagnosis Date   AAA (abdominal aortic aneurysm) without rupture (HCC)    Aortic atherosclerosis (HCC)    Arthritis    Asthma    Brain bleed (HCC)    several years ago after falling off ladder   CAD (coronary artery disease)    COPD (chronic obstructive pulmonary disease) (HCC)    Diverticulitis    GERD (gastroesophageal reflux disease)    Hypertension    Tonsil cancer (HCC)      Past Surgical History:  Procedure Laterality Date   APPENDECTOMY     ENDOVASCULAR REPAIR/STENT GRAFT N/A 10/11/2020   Procedure: ENDOVASCULAR REPAIR/STENT GRAFT;  Surgeon: Jama Cordella MATSU, MD;  Location: [facility] INVASIVE CV LAB;  Service: Cardiovascular;  Laterality: N/A;   FRACTURE SURGERY Left  ORIF left forearm   HERNIA REPAIR Left    inguinal   HERNIA REPAIR     abd   INGUINAL HERNIA REPAIR Right 09/06/2018   Procedure: HERNIA REPAIR INGUINAL ADULT, RIGHT;  Surgeon: Rodolph Romano, MD;  Location: [facility]  ORS;  Service: General;  Laterality: Right;   PORTA CATH INSERTION N/A 03/14/2020   Procedure: PORTA CATH INSERTION;  Surgeon: Marea Selinda RAMAN, MD;  Location: [facility] INVASIVE CV LAB;  Service: Cardiovascular;  Laterality: N/A;   PORTA CATH REMOVAL N/A 10/11/2020   Procedure: PORTA CATH REMOVAL;  Surgeon: Jama Cordella MATSU, MD;  Location: [facility] INVASIVE CV LAB;  Service: Cardiovascular;  Laterality: N/A;    Social History   Socioeconomic History   Marital status: Significant Other    Spouse name: Not on file   Number of children: Not on file   Years of education: Not on file   Highest education level: Not on file  Occupational History   Not on file  Tobacco Use   Smoking status: Every Day    Current packs/day: 1.00    Average packs/day: 1 pack/day for 30.0 years (30.0 ttl pk-yrs)    Types: Cigarettes   Smokeless tobacco: Never  Vaping Use   Vaping status: Never Used  Substance and Sexual Activity   Alcohol use: Not Currently    Alcohol/week: 14.0 standard drinks of alcohol    Types: 14 Cans of beer per week   Drug use: Yes    Types: Marijuana   Sexual activity: Not Currently  Other Topics Concern   Not on file  Social History Narrative   Not on file   Social Drivers of Health   Financial Resource Strain: Not on file  Food Insecurity: Not on file  Transportation Needs: Not on file  Physical Activity: Not on file  Stress: Not on file  Social Connections: Not on file  Intimate Partner Violence: Not on file    Family History  Problem Relation Age of Onset   Lung cancer Mother    Aneurysm Father    AAA (abdominal aortic aneurysm) Father    Hyperlipidemia Father    Hypertension Father    AAA (abdominal aortic aneurysm) Brother      Current Outpatient Medications:    cholecalciferol  (VITAMIN D3) 25 MCG (1000 UNIT) tablet, Take 1,000 Units by mouth daily., Disp: , Rfl:    gabapentin  (NEURONTIN ) 300 MG capsule, Take 300 mg by mouth daily as needed (pain)., Disp: , Rfl:     montelukast  (SINGULAIR ) 10 MG tablet, Take 10 mg by mouth daily., Disp: , Rfl:    tiotropium (SPIRIVA ) 18 MCG inhalation capsule, Place 18 mcg into inhaler and inhale daily., Disp: , Rfl:    vitamin B-12 (CYANOCOBALAMIN ) 1000 MCG tablet, Take 1,000 mcg by mouth daily., Disp: , Rfl:    aspirin  EC 81 MG EC tablet, Take 1 tablet (81 mg total) by mouth daily at 6 (six) AM. Swallow whole. (Patient not taking: Reported on 07/29/2023), Disp: 90 tablet, Rfl: 3   atorvastatin  (LIPITOR) 10 MG tablet, Take 1 tablet (10 mg total) by mouth daily. (Patient not taking: Reported on 07/29/2023), Disp: 90 tablet, Rfl: 3   clopidogrel  (PLAVIX ) 75 MG tablet, Take 1 tablet (75 mg total) by mouth daily at 6 (six) AM. (Patient not taking: Reported on 07/29/2023), Disp: 90 tablet, Rfl: 3   Omega-3 Fatty Acids (FISH OIL) 1000 MG CAPS, Take 1,000 mg by mouth daily. (Patient not taking: Reported on 07/29/2023), Disp: , Rfl:  omeprazole  (PRILOSEC  OTC) 20 MG tablet, Take 20 mg by mouth daily. (Patient not taking: Reported on 07/29/2023), Disp: , Rfl:    oxyCODONE -acetaminophen  (PERCOCET/ROXICET) 5-325 MG tablet, Take 1 tablet by mouth every 6 (six) hours as needed for moderate pain. (Patient not taking: Reported on 07/29/2023), Disp: 15 tablet, Rfl: 0 No current facility-administered medications for this visit.  Facility-Administered Medications Ordered in Other Visits:    sodium chloride  flush (NS) 0.9 % injection 10 mL, 10 mL, Intravenous, PRN, Finnegan, Timothy J, MD, 10 mL at 04/23/20 0850  Physical exam:  Vitals:   07/29/23 1540  BP: 124/84  Pulse: 62  Resp: 18  Temp: 98.2 F (36.8 C)  TempSrc: Tympanic  SpO2: 95%  Weight: 149 lb 12.8 oz (67.9 kg)   Physical Exam Cardiovascular:     Rate and Rhythm: Normal rate and regular rhythm.     Heart sounds: Normal heart sounds.  Pulmonary:     Effort: Pulmonary effort is normal.     Breath sounds: Normal breath sounds.  Abdominal:     General: Bowel sounds are normal. There  is no distension.     Palpations: Abdomen is soft.     Tenderness: There is no abdominal tenderness.  Skin:    General: Skin is warm and dry.  Neurological:     Mental Status: He is alert and oriented to person, place, and time.         Latest Ref Rng & Units 07/29/2023    4:15 PM  CMP  Glucose 70 - 99 mg/dL 886   BUN 8 - 23 mg/dL 21   Creatinine 9.38 - 1.24 mg/dL 9.14   Sodium 864 - 854 mmol/L 135   Potassium 3.5 - 5.1 mmol/L 4.4   Chloride 98 - 111 mmol/L 100   CO2 22 - 32 mmol/L 26   Calcium  8.9 - 10.3 mg/dL 9.1   Total Protein 6.5 - 8.1 g/dL 7.1   Total Bilirubin 0.0 - 1.2 mg/dL 0.7   Alkaline Phos 38 - 126 U/L 66   AST 15 - 41 U/L 48   ALT 0 - 44 U/L 56       Latest Ref Rng & Units 07/29/2023    4:15 PM  CBC  WBC 4.0 - 10.5 K/uL 7.6   Hemoglobin 13.0 - 17.0 g/dL 84.8   Hematocrit 60.9 - 52.0 % 42.7   Platelets 150 - 400 K/uL 285      Assessment and plan- Patient is a 68 y.o. 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  Given that he has homozygosity for C282Y he has inherited pathogenic gene from each of his parents.  Homozygosity for 2 82Y can lead to clinical iron overload which can lead to iron deposition in multiple organs including liver thyroid  heart joints and pituitary.  Goal is to remove excess iron from his body to prevent any long-term toxicity from iron overload including cirrhosis which can increase his risk for liver cancer.  I will obtain CBC with differential CMP ferritin and iron studies today.  We will proceed with weekly phlebotomy at this time with a goal to bring his ferritin down to less than 50 and then keep it between 50-100.  I will also get a baseline MRI liver to see if he  has  evidence of iron overload and or cirrhosis.  I have strongly encouraged him to abstain from alcohol as it can further lead to liver damage as well as elevated ferritin.  He should also communicate his hemochromatosis testing results to his daughter and she will need to get tested.  Patient will need to avoid all forms of oral iron supplements and iron fortified foods as well as uncooked seafood which can increase the risk of Vibrio vulnificus infections.  Patient will continue weekly phlebotomy for the next 3 months and I will see him back in 3 months with labs   Visit Diagnosis 1. Hereditary hemochromatosis (HCC)      Dr. Annah Skene, MD, MPH [facility] at [facility] [phone] 07/30/2023 9:33 AM

## 2023-08-03 ENCOUNTER — Inpatient Hospital Stay: Payer: Medicare HMO

## 2023-08-03 DIAGNOSIS — R5383 Other fatigue: Secondary | ICD-10-CM | POA: Diagnosis not present

## 2023-08-03 DIAGNOSIS — F17210 Nicotine dependence, cigarettes, uncomplicated: Secondary | ICD-10-CM | POA: Diagnosis not present

## 2023-08-03 DIAGNOSIS — Z801 Family history of malignant neoplasm of trachea, bronchus and lung: Secondary | ICD-10-CM | POA: Diagnosis not present

## 2023-08-03 LAB — HEMOGLOBIN AND HEMATOCRIT, BLOOD
HCT: 45.9 % (ref 39.0–52.0)
Hemoglobin: 16 g/dL (ref 13.0–17.0)

## 2023-08-03 NOTE — Progress Notes (Signed)
 Patient tolatered phlebotomy well today, performed in RAC using 20g angiocath. 300cc removed as ordered. No concerns voiced. Snack and po fluids offered. Stable at discharge. AVS given.

## 2023-08-03 NOTE — Patient Instructions (Signed)

## 2023-08-10 ENCOUNTER — Inpatient Hospital Stay: Payer: Medicare HMO

## 2023-08-11 ENCOUNTER — Inpatient Hospital Stay: Payer: Medicare HMO

## 2023-08-11 ENCOUNTER — Telehealth: Payer: Self-pay | Admitting: Oncology

## 2023-08-11 DIAGNOSIS — F17210 Nicotine dependence, cigarettes, uncomplicated: Secondary | ICD-10-CM | POA: Diagnosis not present

## 2023-08-11 DIAGNOSIS — Z801 Family history of malignant neoplasm of trachea, bronchus and lung: Secondary | ICD-10-CM | POA: Diagnosis not present

## 2023-08-11 DIAGNOSIS — R5383 Other fatigue: Secondary | ICD-10-CM | POA: Diagnosis not present

## 2023-08-11 LAB — HEMOGLOBIN AND HEMATOCRIT, BLOOD
HCT: 44.1 % (ref 39.0–52.0)
Hemoglobin: 15.6 g/dL (ref 13.0–17.0)

## 2023-08-11 NOTE — Telephone Encounter (Signed)
He needs multiple sessions of phlebotomy 1-2 times a week as tolerated. Coming today versus Thursday will not make a difference. We may land up needing 40-50 sessions to bring ferritin down to <100. Please accommodate him whenever he can come

## 2023-08-11 NOTE — Telephone Encounter (Signed)
Called to move appointment- patients wife is concerned that he will need this sooner than Thursday- she said he has dangerous levels and is worried it will be worse today. She is bringing him for labs and will like to hear if he can wait or how we can accommodate him sooner than Thursday.

## 2023-08-12 ENCOUNTER — Inpatient Hospital Stay: Payer: Medicare HMO

## 2023-08-17 ENCOUNTER — Inpatient Hospital Stay: Payer: Medicare HMO

## 2023-08-17 DIAGNOSIS — F17210 Nicotine dependence, cigarettes, uncomplicated: Secondary | ICD-10-CM | POA: Diagnosis not present

## 2023-08-17 DIAGNOSIS — Z801 Family history of malignant neoplasm of trachea, bronchus and lung: Secondary | ICD-10-CM | POA: Diagnosis not present

## 2023-08-17 DIAGNOSIS — R5383 Other fatigue: Secondary | ICD-10-CM | POA: Diagnosis not present

## 2023-08-17 LAB — HEMOGLOBIN AND HEMATOCRIT, BLOOD
HCT: 41.2 % (ref 39.0–52.0)
Hemoglobin: 14.6 g/dL (ref 13.0–17.0)

## 2023-08-17 LAB — FERRITIN: Ferritin: 966 ng/mL — ABNORMAL HIGH (ref 24–336)

## 2023-08-17 NOTE — Patient Instructions (Signed)

## 2023-08-17 NOTE — Progress Notes (Signed)
 Patient tolatered phlebotomy well today, performed in RAC using 20g angiocath. 300 cc removed as ordered. Ferritin 1410 No concerns voiced. Snack and po fluids offered. Stable at discharge. AVS given.

## 2023-08-19 ENCOUNTER — Ambulatory Visit
Admission: RE | Admit: 2023-08-19 | Discharge: 2023-08-19 | Disposition: A | Discharge status: Home / Self Care | Payer: Medicare HMO | Source: Ambulatory Visit | Attending: Oncology | Admitting: Oncology

## 2023-08-19 DIAGNOSIS — I719 Aortic aneurysm of unspecified site, without rupture: Secondary | ICD-10-CM | POA: Diagnosis not present

## 2023-08-19 DIAGNOSIS — K5730 Diverticulosis of large intestine without perforation or abscess without bleeding: Secondary | ICD-10-CM | POA: Diagnosis not present

## 2023-08-19 MED ORDER — GADOBUTROL 1 MMOL/ML IV SOLN
6.0000 mL | Freq: Once | INTRAVENOUS | Status: AC | PRN
  Administered 2023-08-19: 6 mL via INTRAVENOUS

## 2023-08-24 ENCOUNTER — Inpatient Hospital Stay: Payer: Medicare HMO

## 2023-08-24 ENCOUNTER — Inpatient Hospital Stay: Payer: Medicare HMO | Attending: Oncology

## 2023-08-24 DIAGNOSIS — R5383 Other fatigue: Secondary | ICD-10-CM | POA: Diagnosis not present

## 2023-08-24 DIAGNOSIS — F17210 Nicotine dependence, cigarettes, uncomplicated: Secondary | ICD-10-CM | POA: Insufficient documentation

## 2023-08-24 DIAGNOSIS — Z801 Family history of malignant neoplasm of trachea, bronchus and lung: Secondary | ICD-10-CM | POA: Insufficient documentation

## 2023-08-24 LAB — HEMOGLOBIN AND HEMATOCRIT, BLOOD
HCT: 41.3 % (ref 39.0–52.0)
Hemoglobin: 14.7 g/dL (ref 13.0–17.0)

## 2023-08-24 LAB — FERRITIN: Ferritin: 1004 ng/mL — ABNORMAL HIGH (ref 24–336)

## 2023-08-24 NOTE — Patient Instructions (Signed)

## 2023-08-24 NOTE — Progress Notes (Signed)
 Patient tolatered phlebotomy well today, performed in LAC using 20g angiocath. 300 cc removed as ordered. Ferritin 966 . No concerns voiced. Snack and po fluids offered. Stable at discharge. Refused AVS

## 2023-08-31 ENCOUNTER — Other Ambulatory Visit: Payer: Self-pay

## 2023-08-31 ENCOUNTER — Inpatient Hospital Stay: Payer: Medicare HMO

## 2023-08-31 DIAGNOSIS — R5383 Other fatigue: Secondary | ICD-10-CM | POA: Diagnosis not present

## 2023-08-31 DIAGNOSIS — F17210 Nicotine dependence, cigarettes, uncomplicated: Secondary | ICD-10-CM | POA: Diagnosis not present

## 2023-08-31 DIAGNOSIS — Z801 Family history of malignant neoplasm of trachea, bronchus and lung: Secondary | ICD-10-CM | POA: Diagnosis not present

## 2023-08-31 LAB — FERRITIN: Ferritin: 854 ng/mL — ABNORMAL HIGH (ref 24–336)

## 2023-08-31 LAB — HEMOGLOBIN AND HEMATOCRIT (CANCER CENTER ONLY)
HCT: 40.2 % (ref 39.0–52.0)
Hemoglobin: 14 g/dL (ref 13.0–17.0)

## 2023-09-04 ENCOUNTER — Other Ambulatory Visit: Payer: Self-pay

## 2023-09-04 DIAGNOSIS — C109 Malignant neoplasm of oropharynx, unspecified: Secondary | ICD-10-CM

## 2023-09-07 ENCOUNTER — Inpatient Hospital Stay: Payer: Medicare HMO

## 2023-09-07 DIAGNOSIS — Z801 Family history of malignant neoplasm of trachea, bronchus and lung: Secondary | ICD-10-CM | POA: Diagnosis not present

## 2023-09-07 DIAGNOSIS — F17210 Nicotine dependence, cigarettes, uncomplicated: Secondary | ICD-10-CM | POA: Diagnosis not present

## 2023-09-07 DIAGNOSIS — R5383 Other fatigue: Secondary | ICD-10-CM | POA: Diagnosis not present

## 2023-09-07 LAB — HEMOGLOBIN AND HEMATOCRIT, BLOOD
HCT: 42 % (ref 39.0–52.0)
Hemoglobin: 14.6 g/dL (ref 13.0–17.0)

## 2023-09-14 ENCOUNTER — Inpatient Hospital Stay: Payer: Medicare HMO

## 2023-09-14 DIAGNOSIS — Z801 Family history of malignant neoplasm of trachea, bronchus and lung: Secondary | ICD-10-CM | POA: Diagnosis not present

## 2023-09-14 DIAGNOSIS — R5383 Other fatigue: Secondary | ICD-10-CM | POA: Diagnosis not present

## 2023-09-14 DIAGNOSIS — F17210 Nicotine dependence, cigarettes, uncomplicated: Secondary | ICD-10-CM | POA: Diagnosis not present

## 2023-09-14 LAB — FERRITIN: Ferritin: 1011 ng/mL — ABNORMAL HIGH (ref 24–336)

## 2023-09-14 LAB — HEMOGLOBIN AND HEMATOCRIT, BLOOD
HCT: 40.8 % (ref 39.0–52.0)
Hemoglobin: 14.3 g/dL (ref 13.0–17.0)

## 2023-09-14 NOTE — Progress Notes (Signed)
 Patient tolatered phlebotomy well today, performed in RAC using 20g angiocath. 300cc removed as ordered. Ferritin goal< 100. No concerns voiced. Snack and po fluids offered. Stable at discharge. Refused AVS .

## 2023-09-14 NOTE — Patient Instructions (Signed)

## 2023-09-21 ENCOUNTER — Inpatient Hospital Stay: Payer: Medicare HMO

## 2023-09-21 DIAGNOSIS — F17210 Nicotine dependence, cigarettes, uncomplicated: Secondary | ICD-10-CM | POA: Diagnosis not present

## 2023-09-21 DIAGNOSIS — Z801 Family history of malignant neoplasm of trachea, bronchus and lung: Secondary | ICD-10-CM | POA: Diagnosis not present

## 2023-09-21 DIAGNOSIS — R5383 Other fatigue: Secondary | ICD-10-CM | POA: Diagnosis not present

## 2023-09-21 LAB — HEMOGLOBIN AND HEMATOCRIT, BLOOD
HCT: 38.7 % — ABNORMAL LOW (ref 39.0–52.0)
Hemoglobin: 13.4 g/dL (ref 13.0–17.0)

## 2023-09-28 ENCOUNTER — Inpatient Hospital Stay: Payer: Medicare HMO

## 2023-09-28 ENCOUNTER — Inpatient Hospital Stay: Payer: Medicare HMO | Attending: Oncology

## 2023-09-28 DIAGNOSIS — R5383 Other fatigue: Secondary | ICD-10-CM | POA: Diagnosis not present

## 2023-09-28 DIAGNOSIS — F17210 Nicotine dependence, cigarettes, uncomplicated: Secondary | ICD-10-CM | POA: Diagnosis not present

## 2023-09-28 DIAGNOSIS — Z801 Family history of malignant neoplasm of trachea, bronchus and lung: Secondary | ICD-10-CM | POA: Insufficient documentation

## 2023-09-28 LAB — HEMOGLOBIN AND HEMATOCRIT, BLOOD
HCT: 43.5 % (ref 39.0–52.0)
Hemoglobin: 14.8 g/dL (ref 13.0–17.0)

## 2023-09-28 NOTE — Patient Instructions (Signed)

## 2023-09-28 NOTE — Progress Notes (Signed)
 Patient tolatered phlebotomy well today, performed in LAC using 20g angiocath. 300 cc removed as ordered. Last ferritin 1011. No concerns voiced. Snack and po fluids offered. Stable at discharge. Patient aware of apts, refused AVS .

## 2023-10-05 ENCOUNTER — Inpatient Hospital Stay: Payer: Medicare HMO

## 2023-10-05 DIAGNOSIS — Z801 Family history of malignant neoplasm of trachea, bronchus and lung: Secondary | ICD-10-CM | POA: Diagnosis not present

## 2023-10-05 DIAGNOSIS — R5383 Other fatigue: Secondary | ICD-10-CM | POA: Diagnosis not present

## 2023-10-05 DIAGNOSIS — F17210 Nicotine dependence, cigarettes, uncomplicated: Secondary | ICD-10-CM | POA: Diagnosis not present

## 2023-10-05 LAB — HEMOGLOBIN AND HEMATOCRIT, BLOOD
HCT: 38 % — ABNORMAL LOW (ref 39.0–52.0)
Hemoglobin: 13.2 g/dL (ref 13.0–17.0)

## 2023-10-12 ENCOUNTER — Inpatient Hospital Stay: Payer: Medicare HMO

## 2023-10-12 DIAGNOSIS — Z801 Family history of malignant neoplasm of trachea, bronchus and lung: Secondary | ICD-10-CM | POA: Diagnosis not present

## 2023-10-12 DIAGNOSIS — F17210 Nicotine dependence, cigarettes, uncomplicated: Secondary | ICD-10-CM | POA: Diagnosis not present

## 2023-10-12 DIAGNOSIS — R5383 Other fatigue: Secondary | ICD-10-CM | POA: Diagnosis not present

## 2023-10-12 LAB — CBC WITH DIFFERENTIAL (CANCER CENTER ONLY)
Abs Immature Granulocytes: 0.02 10*3/uL (ref 0.00–0.07)
Basophils Absolute: 0.1 10*3/uL (ref 0.0–0.1)
Basophils Relative: 1 %
Eosinophils Absolute: 0.1 10*3/uL (ref 0.0–0.5)
Eosinophils Relative: 1 %
HCT: 41.4 % (ref 39.0–52.0)
Hemoglobin: 14.4 g/dL (ref 13.0–17.0)
Immature Granulocytes: 0 %
Lymphocytes Relative: 22 %
Lymphs Abs: 1.3 10*3/uL (ref 0.7–4.0)
MCH: 35.8 pg — ABNORMAL HIGH (ref 26.0–34.0)
MCHC: 34.8 g/dL (ref 30.0–36.0)
MCV: 103 fL — ABNORMAL HIGH (ref 80.0–100.0)
Monocytes Absolute: 0.7 10*3/uL (ref 0.1–1.0)
Monocytes Relative: 11 %
Neutro Abs: 3.8 10*3/uL (ref 1.7–7.7)
Neutrophils Relative %: 65 %
Platelet Count: 276 10*3/uL (ref 150–400)
RBC: 4.02 MIL/uL — ABNORMAL LOW (ref 4.22–5.81)
RDW: 13 % (ref 11.5–15.5)
WBC Count: 5.9 10*3/uL (ref 4.0–10.5)
nRBC: 0 % (ref 0.0–0.2)

## 2023-10-12 LAB — CMP (CANCER CENTER ONLY)
ALT: 42 U/L (ref 0–44)
AST: 47 U/L — ABNORMAL HIGH (ref 15–41)
Albumin: 3.6 g/dL (ref 3.5–5.0)
Alkaline Phosphatase: 68 U/L (ref 38–126)
Anion gap: 6 (ref 5–15)
BUN: 16 mg/dL (ref 8–23)
CO2: 25 mmol/L (ref 22–32)
Calcium: 8.6 mg/dL — ABNORMAL LOW (ref 8.9–10.3)
Chloride: 103 mmol/L (ref 98–111)
Creatinine: 0.85 mg/dL (ref 0.61–1.24)
GFR, Estimated: >60 mL/min (ref >60)
Glucose, Bld: 111 mg/dL — ABNORMAL HIGH (ref 70–99)
Potassium: 4.2 mmol/L (ref 3.5–5.1)
Sodium: 134 mmol/L — ABNORMAL LOW (ref 135–145)
Total Bilirubin: 0.6 mg/dL (ref 0.0–1.2)
Total Protein: 6.5 g/dL (ref 6.5–8.1)

## 2023-10-12 LAB — IRON AND TIBC
Iron: 277 ug/dL — ABNORMAL HIGH (ref 45–182)
Saturation Ratios: 85 % — ABNORMAL HIGH (ref 17.9–39.5)
TIBC: 325 ug/dL (ref 250–450)
UIBC: 48 ug/dL

## 2023-10-12 LAB — FERRITIN: Ferritin: 860 ng/mL — ABNORMAL HIGH (ref 24–336)

## 2023-10-12 NOTE — Progress Notes (Signed)
 Patient tolerated phlebotomy well used 20 gauge IV. Removed* 300 mls as ordered. No concerns and stable at discharge. AVS reviewed and pt refused a copy.

## 2023-10-12 NOTE — Patient Instructions (Signed)

## 2023-10-19 ENCOUNTER — Inpatient Hospital Stay: Payer: Medicare HMO

## 2023-10-19 DIAGNOSIS — Z801 Family history of malignant neoplasm of trachea, bronchus and lung: Secondary | ICD-10-CM | POA: Diagnosis not present

## 2023-10-19 DIAGNOSIS — F17210 Nicotine dependence, cigarettes, uncomplicated: Secondary | ICD-10-CM | POA: Diagnosis not present

## 2023-10-19 DIAGNOSIS — R5383 Other fatigue: Secondary | ICD-10-CM | POA: Diagnosis not present

## 2023-10-19 LAB — HEMOGLOBIN AND HEMATOCRIT, BLOOD
HCT: 40.6 % (ref 39.0–52.0)
Hemoglobin: 14 g/dL (ref 13.0–17.0)

## 2023-10-19 NOTE — Patient Instructions (Signed)

## 2023-10-19 NOTE — Progress Notes (Signed)
 Patient tolerated phlebotomy well used 20 gauge IV. Removed* 300 mls as ordered. No concerns and stable at discharge. AVS reviewed and pt refused a copy.

## 2023-10-23 ENCOUNTER — Other Ambulatory Visit: Payer: Self-pay

## 2023-10-26 ENCOUNTER — Encounter: Payer: Self-pay | Admitting: Oncology

## 2023-10-26 ENCOUNTER — Inpatient Hospital Stay: Payer: Medicare HMO | Attending: Oncology

## 2023-10-26 ENCOUNTER — Inpatient Hospital Stay (HOSPITAL_BASED_OUTPATIENT_CLINIC_OR_DEPARTMENT_OTHER): Payer: Medicare HMO | Admitting: Oncology

## 2023-10-26 ENCOUNTER — Inpatient Hospital Stay

## 2023-10-26 DIAGNOSIS — R5383 Other fatigue: Secondary | ICD-10-CM | POA: Diagnosis not present

## 2023-10-26 DIAGNOSIS — Z801 Family history of malignant neoplasm of trachea, bronchus and lung: Secondary | ICD-10-CM | POA: Diagnosis not present

## 2023-10-26 DIAGNOSIS — F1290 Cannabis use, unspecified, uncomplicated: Secondary | ICD-10-CM | POA: Diagnosis not present

## 2023-10-26 DIAGNOSIS — C109 Malignant neoplasm of oropharynx, unspecified: Secondary | ICD-10-CM

## 2023-10-26 DIAGNOSIS — Z85818 Personal history of malignant neoplasm of other sites of lip, oral cavity, and pharynx: Secondary | ICD-10-CM | POA: Diagnosis not present

## 2023-10-26 DIAGNOSIS — Z9049 Acquired absence of other specified parts of digestive tract: Secondary | ICD-10-CM | POA: Insufficient documentation

## 2023-10-26 DIAGNOSIS — R7989 Other specified abnormal findings of blood chemistry: Secondary | ICD-10-CM | POA: Diagnosis not present

## 2023-10-26 DIAGNOSIS — Z923 Personal history of irradiation: Secondary | ICD-10-CM | POA: Insufficient documentation

## 2023-10-26 DIAGNOSIS — F17210 Nicotine dependence, cigarettes, uncomplicated: Secondary | ICD-10-CM | POA: Diagnosis not present

## 2023-10-26 LAB — CBC WITH DIFFERENTIAL (CANCER CENTER ONLY)
Abs Immature Granulocytes: 0.02 10*3/uL (ref 0.00–0.07)
Basophils Absolute: 0.1 10*3/uL (ref 0.0–0.1)
Basophils Relative: 1 %
Eosinophils Absolute: 0.1 10*3/uL (ref 0.0–0.5)
Eosinophils Relative: 1 %
HCT: 40.2 % (ref 39.0–52.0)
Hemoglobin: 14.1 g/dL (ref 13.0–17.0)
Immature Granulocytes: 0 %
Lymphocytes Relative: 17 %
Lymphs Abs: 1.1 10*3/uL (ref 0.7–4.0)
MCH: 35.7 pg — ABNORMAL HIGH (ref 26.0–34.0)
MCHC: 35.1 g/dL (ref 30.0–36.0)
MCV: 101.8 fL — ABNORMAL HIGH (ref 80.0–100.0)
Monocytes Absolute: 0.7 10*3/uL (ref 0.1–1.0)
Monocytes Relative: 10 %
Neutro Abs: 4.9 10*3/uL (ref 1.7–7.7)
Neutrophils Relative %: 71 %
Platelet Count: 256 10*3/uL (ref 150–400)
RBC: 3.95 MIL/uL — ABNORMAL LOW (ref 4.22–5.81)
RDW: 12.7 % (ref 11.5–15.5)
WBC Count: 6.9 10*3/uL (ref 4.0–10.5)
nRBC: 0 % (ref 0.0–0.2)

## 2023-10-26 LAB — CMP (CANCER CENTER ONLY)
ALT: 57 U/L — ABNORMAL HIGH (ref 0–44)
AST: 62 U/L — ABNORMAL HIGH (ref 15–41)
Albumin: 3.9 g/dL (ref 3.5–5.0)
Alkaline Phosphatase: 71 U/L (ref 38–126)
Anion gap: 8 (ref 5–15)
BUN: 16 mg/dL (ref 8–23)
CO2: 25 mmol/L (ref 22–32)
Calcium: 8.6 mg/dL — ABNORMAL LOW (ref 8.9–10.3)
Chloride: 100 mmol/L (ref 98–111)
Creatinine: 0.87 mg/dL (ref 0.61–1.24)
GFR, Estimated: >60 mL/min (ref >60)
Glucose, Bld: 102 mg/dL — ABNORMAL HIGH (ref 70–99)
Potassium: 4.7 mmol/L (ref 3.5–5.1)
Sodium: 133 mmol/L — ABNORMAL LOW (ref 135–145)
Total Bilirubin: 0.5 mg/dL (ref 0.0–1.2)
Total Protein: 6.7 g/dL (ref 6.5–8.1)

## 2023-10-26 LAB — IRON AND TIBC
Iron: 258 ug/dL — ABNORMAL HIGH (ref 45–182)
Saturation Ratios: 82 % — ABNORMAL HIGH (ref 17.9–39.5)
TIBC: 316 ug/dL (ref 250–450)
UIBC: 58 ug/dL

## 2023-10-26 LAB — FERRITIN: Ferritin: 643 ng/mL — ABNORMAL HIGH (ref 24–336)

## 2023-10-27 ENCOUNTER — Encounter: Payer: Self-pay | Admitting: Oncology

## 2023-10-27 NOTE — Progress Notes (Signed)
 Hematology/Oncology Consult note [facility]  Telephone:([phone] [phone]  Patient Care Team: Deborah Falling, Kirby Peoples as PCP - General (Physician Assistant) Avonne Boettcher, MD as Consulting Physician (Hematology and Oncology) Glenis Langdon, MD as Referring Physician (Radiation Oncology) Alben Alma, MD as Consulting Physician (General Surgery)   Name of the patient: Ronnie Ingram  [id number]  [birth year]   Date of visit: 10/27/23  Diagnosis- 1.  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  PET CT scan in March 2022 showed resolution of adenopathy and decrease in the size and activity of the tonsillar mass.  He also had infrarenal AAA 5.2 cm for which she underwent endovascular repair by vascular surgery subsequently.   Patient is now being referred to us  for new diagnosis of hereditary hemochromatosis.  Labs from 06/29/2023 showed H&H of 16.1/45.5 with an MCV of 101.  White count 8.1, platelets 310.  Ferritin levels elevated at 2211 with an iron saturation of 94%.  CMP showed mildly elevated AST and ALT of 46 and 51 total bilirubin was normal.  Hepatitis A, B, and C testing negative.  Hereditary hemochromatosis testing showed homozygosity for C282Y.  Last CT abdomen from 2022 did not show any evidence of cirrhosis.    Interval history-patient continues to drink couple of beers daily and is trying to cut down.  Denies other acute complaints at this time  ECOG PS- 1 Pain scale- 0   Review of systems- Review of Systems   Constitutional:  Positive for malaise/fatigue. Negative for chills, fever and weight loss.  HENT:  Negative for congestion, ear discharge and nosebleeds.   Eyes:  Negative for blurred vision.  Respiratory:  Negative for cough, hemoptysis, sputum production, shortness of breath and wheezing.   Cardiovascular:  Negative for chest pain, palpitations, orthopnea and claudication.  Gastrointestinal:  Negative for abdominal pain, blood in stool, constipation, diarrhea, heartburn, melena, nausea and vomiting.  Genitourinary:  Negative for dysuria, flank pain, frequency, hematuria and urgency.  Musculoskeletal:  Negative for back pain, joint pain and myalgias.  Skin:  Negative for rash.  Neurological:  Negative for dizziness, tingling, focal weakness, seizures, weakness and headaches.  Endo/Heme/Allergies:  Does not bruise/bleed easily.  Psychiatric/Behavioral:  Negative for depression and suicidal ideas. The patient does not have insomnia.       No Known Allergies   Past Medical History:  Diagnosis Date   AAA (abdominal aortic aneurysm) without rupture (HCC)    Aortic atherosclerosis (HCC)    Arthritis    Asthma    Brain bleed (HCC)    several years ago after falling off ladder   CAD (coronary artery disease)    COPD (chronic obstructive pulmonary disease) (HCC)    Diverticulitis    GERD (gastroesophageal reflux disease)    Hypertension    Tonsil cancer (HCC)      Past Surgical History:  Procedure Laterality Date   APPENDECTOMY     ENDOVASCULAR REPAIR/STENT GRAFT N/A 10/11/2020   Procedure: ENDOVASCULAR REPAIR/STENT GRAFT;  Surgeon: Jackquelyn Mass, MD;  Location: [facility] INVASIVE CV LAB;  Service: Cardiovascular;  Laterality: N/A;  FRACTURE SURGERY Left    ORIF left forearm   HERNIA REPAIR Left    inguinal   HERNIA REPAIR     abd   INGUINAL HERNIA REPAIR Right 09/06/2018   Procedure: HERNIA REPAIR INGUINAL ADULT, RIGHT;  Surgeon: Eldred Grego, MD;  Location: [facility]  ORS;  Service: General;  Laterality: Right;   PORTA CATH INSERTION N/A 03/14/2020   Procedure: PORTA CATH INSERTION;  Surgeon: Celso College, MD;  Location: [facility] INVASIVE CV LAB;  Service: Cardiovascular;  Laterality: N/A;   PORTA CATH REMOVAL N/A 10/11/2020   Procedure: PORTA CATH REMOVAL;  Surgeon: Jackquelyn Mass, MD;  Location: [facility] INVASIVE CV LAB;  Service: Cardiovascular;  Laterality: N/A;    Social History   Socioeconomic History   Marital status: Significant Other    Spouse name: Not on file   Number of children: Not on file   Years of education: Not on file   Highest education level: Not on file  Occupational History   Not on file  Tobacco Use   Smoking status: Every Day    Current packs/day: 1.00    Average packs/day: 1 pack/day for 30.0 years (30.0 ttl pk-yrs)    Types: Cigarettes   Smokeless tobacco: Never  Vaping Use   Vaping status: Never Used  Substance and Sexual Activity   Alcohol use: Not Currently    Alcohol/week: 14.0 standard drinks of alcohol    Types: 14 Cans of beer per week   Drug use: Yes    Types: Marijuana   Sexual activity: Not Currently  Other Topics Concern   Not on file  Social History Narrative   Not on file   Social Drivers of Health   Financial Resource Strain: Not on file  Food Insecurity: Not on file  Transportation Needs: Not on file  Physical Activity: Not on file  Stress: Not on file  Social Connections: Not on file  Intimate Partner Violence: Not on file    Family History  Problem Relation Age of Onset   Lung cancer Mother    Aneurysm Father    AAA (abdominal aortic aneurysm) Father    Hyperlipidemia Father    Hypertension Father    AAA (abdominal aortic aneurysm) Brother      Current Outpatient Medications:    aspirin  EC 81 MG EC tablet, Take 1 tablet (81 mg total) by mouth daily at 6 (six) AM. Swallow whole. (Patient not taking: Reported on 07/29/2023), Disp: 90 tablet, Rfl: 3   atorvastatin  (LIPITOR) 10 MG tablet,  Take 1 tablet (10 mg total) by mouth daily. (Patient not taking: Reported on 07/29/2023), Disp: 90 tablet, Rfl: 3   cholecalciferol  (VITAMIN D3) 25 MCG (1000 UNIT) tablet, Take 1,000 Units by mouth daily., Disp: , Rfl:    clopidogrel  (PLAVIX ) 75 MG tablet, Take 1 tablet (75 mg total) by mouth daily at 6 (six) AM. (Patient not taking: Reported on 07/29/2023), Disp: 90 tablet, Rfl: 3   gabapentin  (NEURONTIN ) 300 MG capsule, Take 300 mg by mouth daily as needed (pain)., Disp: , Rfl:    montelukast  (SINGULAIR ) 10 MG tablet, Take 10 mg by mouth daily., Disp: , Rfl:    Omega-3 Fatty Acids (FISH OIL) 1000 MG CAPS, Take 1,000 mg by mouth daily. (Patient not taking: Reported on 07/29/2023), Disp: , Rfl:    omeprazole  (PRILOSEC  OTC) 20 MG tablet, Take 20 mg by mouth daily. (Patient not taking: Reported on 07/29/2023), Disp: , Rfl:    oxyCODONE -acetaminophen  (PERCOCET/ROXICET) 5-325 MG tablet, Take  1 tablet by mouth every 6 (six) hours as needed for moderate pain. (Patient not taking: Reported on 07/29/2023), Disp: 15 tablet, Rfl: 0   tiotropium (SPIRIVA ) 18 MCG inhalation capsule, Place 18 mcg into inhaler and inhale daily., Disp: , Rfl:    vitamin B-12 (CYANOCOBALAMIN ) 1000 MCG tablet, Take 1,000 mcg by mouth daily., Disp: , Rfl:  No current facility-administered medications for this visit.  Facility-Administered Medications Ordered in Other Visits:    sodium chloride  flush (NS) 0.9 % injection 10 mL, 10 mL, Intravenous, PRN, Finnegan, Timothy J, MD, 10 mL at 04/23/20 0850  Physical exam:  Vitals:   10/26/23 1418  BP: (!) 159/78  Pulse: 77  Resp: 20  Temp: 98 F (36.7 C)  TempSrc: Tympanic  SpO2: 96%  Weight: 148 lb 9.6 oz (67.4 kg)  Height: 5\' 9"  (1.753 m)   Physical Exam Cardiovascular:     Rate and Rhythm: Normal rate and regular rhythm.     Heart sounds: Normal heart sounds.  Pulmonary:     Effort: Pulmonary effort is normal.     Breath sounds: Normal breath sounds.  Abdominal:     General:  Bowel sounds are normal.     Palpations: Abdomen is soft.  Skin:    General: Skin is warm and dry.  Neurological:     Mental Status: He is alert and oriented to person, place, and time.      I have personally reviewed labs listed below:    Latest Ref Rng & Units 10/26/2023    2:03 PM  CMP  Glucose 70 - 99 mg/dL 161   BUN 8 - 23 mg/dL 16   Creatinine 0.96 - 1.24 mg/dL 0.45   Sodium 409 - 811 mmol/L 133   Potassium 3.5 - 5.1 mmol/L 4.7   Chloride 98 - 111 mmol/L 100   CO2 22 - 32 mmol/L 25   Calcium  8.9 - 10.3 mg/dL 8.6   Total Protein 6.5 - 8.1 g/dL 6.7   Total Bilirubin 0.0 - 1.2 mg/dL 0.5   Alkaline Phos 38 - 126 U/L 71   AST 15 - 41 U/L 62   ALT 0 - 44 U/L 57       Latest Ref Rng & Units 10/26/2023    2:03 PM  CBC  WBC 4.0 - 10.5 K/uL 6.9   Hemoglobin 13.0 - 17.0 g/dL 91.4   Hematocrit 78.2 - 52.0 % 40.2   Platelets 150 - 400 K/uL 256      Assessment and plan- Patient is a 68 y.o. male here for routine follow-up of hereditary hemochromatosis   Patient had evidence of hepatic iron overload noted on his MRI in February 2025.  There was no liver lesion seen or evidence of cirrhosis.  I have reviewed MRI images independently and discussed findings with the patient.  I will plan to repeat an ultrasound in 6 months time.  Patient will continue with weekly phlebotomy at this time.  Hemoglobin remained stable between 13-14.  Ferritin levels are slowly coming down from 1400 presently to 643.  Goal is to reduce ferritin to less than 50 and thereafter maintain it less than 100.  CBC, CMP, ferritin in 6 months and I will see him thereafter.  Patient also has evidence of abnormal LFTs likely in the setting of hemochromatosis as well as ongoing alcohol intake.  Continue to monitor   Visit Diagnosis 1. Hereditary hemochromatosis (HCC)   2. Iron overload   3. Abnormal LFTs  Dr. Seretha Dance, MD, MPH [facility] at [facility] [phone] 10/27/2023 8:40  AM

## 2023-11-02 ENCOUNTER — Inpatient Hospital Stay

## 2023-11-02 DIAGNOSIS — R7989 Other specified abnormal findings of blood chemistry: Secondary | ICD-10-CM | POA: Diagnosis not present

## 2023-11-02 DIAGNOSIS — R5383 Other fatigue: Secondary | ICD-10-CM | POA: Diagnosis not present

## 2023-11-02 DIAGNOSIS — F1290 Cannabis use, unspecified, uncomplicated: Secondary | ICD-10-CM | POA: Diagnosis not present

## 2023-11-02 DIAGNOSIS — Z801 Family history of malignant neoplasm of trachea, bronchus and lung: Secondary | ICD-10-CM | POA: Diagnosis not present

## 2023-11-02 DIAGNOSIS — Z85818 Personal history of malignant neoplasm of other sites of lip, oral cavity, and pharynx: Secondary | ICD-10-CM | POA: Diagnosis not present

## 2023-11-02 DIAGNOSIS — Z923 Personal history of irradiation: Secondary | ICD-10-CM | POA: Diagnosis not present

## 2023-11-02 DIAGNOSIS — F17210 Nicotine dependence, cigarettes, uncomplicated: Secondary | ICD-10-CM | POA: Diagnosis not present

## 2023-11-02 DIAGNOSIS — Z9049 Acquired absence of other specified parts of digestive tract: Secondary | ICD-10-CM | POA: Diagnosis not present

## 2023-11-02 LAB — HEMOGLOBIN AND HEMATOCRIT, BLOOD
HCT: 44.1 % (ref 39.0–52.0)
Hemoglobin: 15.1 g/dL (ref 13.0–17.0)

## 2023-11-02 NOTE — Patient Instructions (Signed)

## 2023-11-02 NOTE — Progress Notes (Signed)
 Ronnie Ingram presents today for phlebotomy per MD orders. Phlebotomy procedure started at 1330 and ended at 1340. 300 mls removed. Patient tolerated procedure well. IV needle removed intact.

## 2023-11-09 ENCOUNTER — Inpatient Hospital Stay

## 2023-11-09 DIAGNOSIS — F1290 Cannabis use, unspecified, uncomplicated: Secondary | ICD-10-CM | POA: Diagnosis not present

## 2023-11-09 DIAGNOSIS — Z923 Personal history of irradiation: Secondary | ICD-10-CM | POA: Diagnosis not present

## 2023-11-09 DIAGNOSIS — R7989 Other specified abnormal findings of blood chemistry: Secondary | ICD-10-CM | POA: Diagnosis not present

## 2023-11-09 DIAGNOSIS — Z801 Family history of malignant neoplasm of trachea, bronchus and lung: Secondary | ICD-10-CM | POA: Diagnosis not present

## 2023-11-09 DIAGNOSIS — Z9049 Acquired absence of other specified parts of digestive tract: Secondary | ICD-10-CM | POA: Diagnosis not present

## 2023-11-09 DIAGNOSIS — R5383 Other fatigue: Secondary | ICD-10-CM | POA: Diagnosis not present

## 2023-11-09 DIAGNOSIS — Z85818 Personal history of malignant neoplasm of other sites of lip, oral cavity, and pharynx: Secondary | ICD-10-CM | POA: Diagnosis not present

## 2023-11-09 DIAGNOSIS — F17210 Nicotine dependence, cigarettes, uncomplicated: Secondary | ICD-10-CM | POA: Diagnosis not present

## 2023-11-09 LAB — HEMOGLOBIN AND HEMATOCRIT, BLOOD
HCT: 43.1 % (ref 39.0–52.0)
Hemoglobin: 15.3 g/dL (ref 13.0–17.0)

## 2023-11-09 NOTE — Progress Notes (Signed)
 Ronnie Ingram presents today for phlebotomy per MD orders. Phlebotomy procedure started at 1325 and ended at 1338. 300 mls removed. Patient tolerated procedure well. IV needle removed intact.

## 2023-11-09 NOTE — Patient Instructions (Signed)

## 2023-11-17 ENCOUNTER — Inpatient Hospital Stay

## 2023-11-17 DIAGNOSIS — R7989 Other specified abnormal findings of blood chemistry: Secondary | ICD-10-CM | POA: Diagnosis not present

## 2023-11-17 DIAGNOSIS — Z85818 Personal history of malignant neoplasm of other sites of lip, oral cavity, and pharynx: Secondary | ICD-10-CM | POA: Diagnosis not present

## 2023-11-17 DIAGNOSIS — Z9049 Acquired absence of other specified parts of digestive tract: Secondary | ICD-10-CM | POA: Diagnosis not present

## 2023-11-17 DIAGNOSIS — Z923 Personal history of irradiation: Secondary | ICD-10-CM | POA: Diagnosis not present

## 2023-11-17 DIAGNOSIS — F17210 Nicotine dependence, cigarettes, uncomplicated: Secondary | ICD-10-CM | POA: Diagnosis not present

## 2023-11-17 DIAGNOSIS — F1290 Cannabis use, unspecified, uncomplicated: Secondary | ICD-10-CM | POA: Diagnosis not present

## 2023-11-17 DIAGNOSIS — R5383 Other fatigue: Secondary | ICD-10-CM | POA: Diagnosis not present

## 2023-11-17 DIAGNOSIS — Z801 Family history of malignant neoplasm of trachea, bronchus and lung: Secondary | ICD-10-CM | POA: Diagnosis not present

## 2023-11-17 LAB — HEMOGLOBIN AND HEMATOCRIT, BLOOD
HCT: 44.6 % (ref 39.0–52.0)
Hemoglobin: 15.4 g/dL (ref 13.0–17.0)

## 2023-11-17 LAB — FERRITIN: Ferritin: 534 ng/mL — ABNORMAL HIGH (ref 24–336)

## 2023-11-17 NOTE — Patient Instructions (Signed)

## 2023-11-17 NOTE — Progress Notes (Signed)
 Ronnie Ingram presents today for phlebotomy per MD orders. Phlebotomy procedure started at 1325 and ended at 1344. 300 mls removed. Patient tolerated procedure well. IV needle removed intact.

## 2023-11-24 ENCOUNTER — Inpatient Hospital Stay: Attending: Oncology

## 2023-11-24 ENCOUNTER — Inpatient Hospital Stay

## 2023-11-24 LAB — HEMOGLOBIN AND HEMATOCRIT, BLOOD
HCT: 41.8 % (ref 39.0–52.0)
Hemoglobin: 14.7 g/dL (ref 13.0–17.0)

## 2023-11-24 NOTE — Progress Notes (Signed)
 Ronnie Ingram presents today for phlebotomy per MD orders. Phlebotomy procedure started at 1355 and ended at 1403. 300 mls  removed. Patient tolerated procedure well. IV needle removed intact.

## 2023-11-24 NOTE — Patient Instructions (Signed)

## 2023-12-01 ENCOUNTER — Inpatient Hospital Stay

## 2023-12-01 LAB — HEMOGLOBIN AND HEMATOCRIT, BLOOD
HCT: 42 % (ref 39.0–52.0)
Hemoglobin: 14.5 g/dL (ref 13.0–17.0)

## 2023-12-01 NOTE — Patient Instructions (Signed)

## 2023-12-01 NOTE — Progress Notes (Signed)
 Ronnie Ingram presents today for phlebotomy per MD orders. Phlebotomy procedure started at 1345 and ended at 1355. 300 mls removed. Patient tolerated procedure well. IV needle removed intact.

## 2023-12-08 ENCOUNTER — Inpatient Hospital Stay

## 2023-12-08 LAB — HEMOGLOBIN AND HEMATOCRIT, BLOOD
HCT: 39.7 % (ref 39.0–52.0)
Hemoglobin: 14.1 g/dL (ref 13.0–17.0)

## 2023-12-08 NOTE — Patient Instructions (Signed)

## 2023-12-08 NOTE — Progress Notes (Signed)
 Haven Lion presents today for phlebotomy per MD orders. Phlebotomy procedure started at 1342 and ended at 1350. 300 mls removed. Patient tolerated procedure well. IV needle removed intact.

## 2023-12-15 ENCOUNTER — Inpatient Hospital Stay

## 2023-12-15 LAB — HEMOGLOBIN AND HEMATOCRIT, BLOOD
HCT: 41.6 % (ref 39.0–52.0)
Hemoglobin: 14.7 g/dL (ref 13.0–17.0)

## 2023-12-15 NOTE — Patient Instructions (Signed)

## 2023-12-15 NOTE — Progress Notes (Signed)
 Ronnie Ingram presents today for phlebotomy per MD orders. Phlebotomy procedure started at 1345 and ended at 1358. 300 mls removed. Patient tolerated procedure well. IV needle removed intact.

## 2023-12-22 ENCOUNTER — Inpatient Hospital Stay: Attending: Oncology

## 2023-12-22 ENCOUNTER — Inpatient Hospital Stay

## 2023-12-22 LAB — HEMOGLOBIN AND HEMATOCRIT, BLOOD
HCT: 42.5 % (ref 39.0–52.0)
Hemoglobin: 14.8 g/dL (ref 13.0–17.0)

## 2023-12-22 NOTE — Patient Instructions (Signed)

## 2023-12-22 NOTE — Progress Notes (Signed)
 Ronnie Ingram presents today for phlebotomy per MD orders. Phlebotomy procedure started at 1430 and ended at 1440. 300 mls removed. Patient tolerated procedure well. IV needle removed intact.

## 2023-12-29 ENCOUNTER — Inpatient Hospital Stay

## 2023-12-29 LAB — HEMOGLOBIN AND HEMATOCRIT, BLOOD
HCT: 39.3 % (ref 39.0–52.0)
Hemoglobin: 13.9 g/dL (ref 13.0–17.0)

## 2023-12-29 NOTE — Patient Instructions (Signed)

## 2023-12-29 NOTE — Progress Notes (Signed)
 Ronnie Ingram presents today for phlebotomy per MD orders. Phlebotomy procedure started at 1345 and ended at 1558. 300 mls removed. Patient tolerated procedure well. IV needle removed intact.

## 2024-04-04 DIAGNOSIS — M25511 Pain in right shoulder: Secondary | ICD-10-CM | POA: Diagnosis not present

## 2024-04-04 DIAGNOSIS — Z0131 Encounter for examination of blood pressure with abnormal findings: Secondary | ICD-10-CM | POA: Diagnosis not present

## 2024-04-04 DIAGNOSIS — Z1389 Encounter for screening for other disorder: Secondary | ICD-10-CM | POA: Diagnosis not present

## 2024-04-18 ENCOUNTER — Ambulatory Visit: Admission: RE | Admit: 2024-04-18 | Source: Ambulatory Visit

## 2024-04-27 ENCOUNTER — Inpatient Hospital Stay

## 2024-04-27 ENCOUNTER — Encounter: Payer: Self-pay | Admitting: Oncology

## 2024-04-27 ENCOUNTER — Inpatient Hospital Stay: Attending: Oncology

## 2024-04-27 ENCOUNTER — Inpatient Hospital Stay: Admitting: Oncology

## 2024-04-27 DIAGNOSIS — R5383 Other fatigue: Secondary | ICD-10-CM | POA: Insufficient documentation

## 2024-04-27 DIAGNOSIS — F1290 Cannabis use, unspecified, uncomplicated: Secondary | ICD-10-CM | POA: Insufficient documentation

## 2024-04-27 DIAGNOSIS — Z801 Family history of malignant neoplasm of trachea, bronchus and lung: Secondary | ICD-10-CM | POA: Insufficient documentation

## 2024-04-27 DIAGNOSIS — F17210 Nicotine dependence, cigarettes, uncomplicated: Secondary | ICD-10-CM | POA: Diagnosis not present

## 2024-04-27 DIAGNOSIS — R7401 Elevation of levels of liver transaminase levels: Secondary | ICD-10-CM | POA: Insufficient documentation

## 2024-04-27 LAB — CMP (CANCER CENTER ONLY)
ALT: 57 U/L — ABNORMAL HIGH (ref 0–44)
AST: 58 U/L — ABNORMAL HIGH (ref 15–41)
Albumin: 4.2 g/dL (ref 3.5–5.0)
Alkaline Phosphatase: 66 U/L (ref 38–126)
Anion gap: 9 (ref 5–15)
BUN: 16 mg/dL (ref 8–23)
CO2: 24 mmol/L (ref 22–32)
Calcium: 9 mg/dL (ref 8.9–10.3)
Chloride: 97 mmol/L — ABNORMAL LOW (ref 98–111)
Creatinine: 0.86 mg/dL (ref 0.61–1.24)
GFR, Estimated: >60 mL/min (ref >60)
Glucose, Bld: 102 mg/dL — ABNORMAL HIGH (ref 70–99)
Potassium: 4.6 mmol/L (ref 3.5–5.1)
Sodium: 130 mmol/L — ABNORMAL LOW (ref 135–145)
Total Bilirubin: 0.9 mg/dL (ref 0.0–1.2)
Total Protein: 7.2 g/dL (ref 6.5–8.1)

## 2024-04-27 LAB — CBC WITH DIFFERENTIAL (CANCER CENTER ONLY)
Abs Immature Granulocytes: 0.03 K/uL (ref 0.00–0.07)
Basophils Absolute: 0.1 K/uL (ref 0.0–0.1)
Basophils Relative: 1 %
Eosinophils Absolute: 0.1 K/uL (ref 0.0–0.5)
Eosinophils Relative: 2 %
HCT: 43.4 % (ref 39.0–52.0)
Hemoglobin: 15.9 g/dL (ref 13.0–17.0)
Immature Granulocytes: 0 %
Lymphocytes Relative: 20 %
Lymphs Abs: 1.4 K/uL (ref 0.7–4.0)
MCH: 35.1 pg — ABNORMAL HIGH (ref 26.0–34.0)
MCHC: 36.6 g/dL — ABNORMAL HIGH (ref 30.0–36.0)
MCV: 95.8 fL (ref 80.0–100.0)
Monocytes Absolute: 0.8 K/uL (ref 0.1–1.0)
Monocytes Relative: 12 %
Neutro Abs: 4.5 K/uL (ref 1.7–7.7)
Neutrophils Relative %: 65 %
Platelet Count: 247 K/uL (ref 150–400)
RBC: 4.53 MIL/uL (ref 4.22–5.81)
RDW: 13.2 % (ref 11.5–15.5)
WBC Count: 6.8 K/uL (ref 4.0–10.5)
nRBC: 0 % (ref 0.0–0.2)

## 2024-04-27 LAB — IRON AND TIBC
Iron: 283 ug/dL — ABNORMAL HIGH (ref 45–182)
Saturation Ratios: 87 % — ABNORMAL HIGH (ref 17.9–39.5)
TIBC: 325 ug/dL (ref 250–450)
UIBC: 42 ug/dL

## 2024-04-27 LAB — FERRITIN: Ferritin: 703 ng/mL — ABNORMAL HIGH (ref 24–336)

## 2024-04-27 NOTE — Progress Notes (Signed)
 Hematology/Oncology Consult note [facility]  [phone] [phone]  Patient Care Team: Norvell Boyer, DEVONNA as PCP - General (Physician Assistant) Melanee Annah BROCKS, MD as Consulting Physician (Hematology and Oncology) Lenn Aran, MD as Referring Physician (Radiation Oncology) Jordis Laneta FALCON, MD as Consulting Physician (General Surgery)   Name of the patient: Ronnie Ingram  [id number]  11/04/1955   Date of visit: 04/27/24  Diagnosis- 1.  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  PET CT scan in March 2022 showed resolution of adenopathy and decrease in the size and activity of the tonsillar mass.  He also had infrarenal AAA 5.2 cm for which she underwent endovascular repair by vascular surgery subsequently.   Patient is now being referred to us  for new diagnosis of hereditary hemochromatosis.  Labs from 06/29/2023 showed H&H of 16.1/45.5 with an MCV of 101.  White count 8.1, platelets 310.  Ferritin levels elevated at 2211 with an iron saturation of 94%.  CMP showed mildly elevated AST and ALT of 46 and 51 total bilirubin was normal.  Hepatitis A, B, and C testing negative.  Hereditary hemochromatosis testing showed homozygosity for C282Y.  Last CT abdomen from 2022 did not show any evidence of cirrhosis.      Interval history-patient has baseline fatigue.  Overall he is doing well.  He has not come back for phlebotomy since July 2025  ECOG PS- 1 Pain scale- 0   Review of systems- Review of Systems   Constitutional:  Positive for malaise/fatigue. Negative for chills, fever and weight loss.  HENT:  Negative for congestion, ear discharge and nosebleeds.   Eyes:  Negative for blurred vision.  Respiratory:  Negative for cough, hemoptysis, sputum production, shortness of breath and wheezing.   Cardiovascular:  Negative for chest pain, palpitations, orthopnea and claudication.  Gastrointestinal:  Negative for abdominal pain, blood in stool, constipation, diarrhea, heartburn, melena, nausea and vomiting.  Genitourinary:  Negative for dysuria, flank pain, frequency, hematuria and urgency.  Musculoskeletal:  Negative for back pain, joint pain and myalgias.  Skin:  Negative for rash.  Neurological:  Negative for dizziness, tingling, focal weakness, seizures, weakness and headaches.  Endo/Heme/Allergies:  Does not bruise/bleed easily.  Psychiatric/Behavioral:  Negative for depression and suicidal ideas. The patient does not have insomnia.       No Known Allergies   Past Medical History:  Diagnosis Date   AAA (abdominal aortic aneurysm) without rupture    Aortic atherosclerosis    Arthritis    Asthma    Brain bleed (HCC)    several years ago after falling off ladder   CAD (coronary artery disease)    COPD (chronic obstructive pulmonary disease) (HCC)    Diverticulitis    GERD (gastroesophageal reflux disease)    Hypertension    Tonsil cancer (HCC)      Past Surgical History:  Procedure Laterality Date   APPENDECTOMY     ENDOVASCULAR STENT GRAFT (AAA) N/A 10/11/2020   Procedure: ENDOVASCULAR REPAIR/STENT GRAFT;  Surgeon: Jama Cordella MATSU, MD;  Location: [facility];  Service: Cardiovascular;  Laterality:  N/A;   FRACTURE SURGERY Left    ORIF left forearm   HERNIA REPAIR Left    inguinal   HERNIA REPAIR     abd   INGUINAL HERNIA REPAIR Right 09/06/2018   Procedure: HERNIA REPAIR INGUINAL ADULT, RIGHT;  Surgeon: Rodolph Romano, MD;  Location: [facility];  Service:  General;  Laterality: Right;   PORTA CATH INSERTION N/A 03/14/2020   Procedure: PORTA CATH INSERTION;  Surgeon: Marea Selinda RAMAN, MD;  Location: [facility];  Service: Cardiovascular;  Laterality: N/A;   PORTA CATH REMOVAL N/A 10/11/2020   Procedure: PORTA CATH REMOVAL;  Surgeon: Jama Cordella MATSU, MD;  Location: [facility];  Service: Cardiovascular;  Laterality: N/A;    Social History   Socioeconomic History   Marital status: Significant Other    Spouse name: Not on file   Number of children: Not on file   Years of education: Not on file   Highest education level: Not on file  Occupational History   Not on file  Tobacco Use   Smoking status: Every Day    Current packs/day: 1.00    Average packs/day: 1 pack/day for 30.0 years (30.0 ttl pk-yrs)    Types: Cigarettes   Smokeless tobacco: Never  Vaping Use   Vaping status: Never Used  Substance and Sexual Activity   Alcohol use: Not Currently    Alcohol/week: 14.0 standard drinks of alcohol    Types: 14 Cans of beer per week   Drug use: Yes    Types: Marijuana   Sexual activity: Not Currently  Other Topics Concern   Not on file  Social History Narrative   Not on file   Social Drivers of Health   Financial Resource Strain: Not on file  Food Insecurity: No Food Insecurity (04/27/2024)   Hunger Vital Sign    Worried About Running Out of Food in the Last Year: Never true    Ran Out of Food in the Last Year: Never true  Transportation Needs: No Transportation Needs (04/27/2024)   PRAPARE - Administrator, Civil Service (Medical): No    Lack of Transportation (Non-Medical): No  Physical Activity: Not on file  Stress: Not on file  Social Connections: Not on file  Intimate Partner Violence: Not At Risk (04/27/2024)   Humiliation, Afraid, Rape, and Kick questionnaire    Fear of Current or Ex-Partner: No    Emotionally Abused: No    Physically Abused: No    Sexually Abused: No    Family History   Problem Relation Age of Onset   Lung cancer Mother    Aneurysm Father    AAA (abdominal aortic aneurysm) Father    Hyperlipidemia Father    Hypertension Father    AAA (abdominal aortic aneurysm) Brother      Current Outpatient Medications:    cholecalciferol  (VITAMIN D3) 25 MCG (1000 UNIT) tablet, Take 1,000 Units by mouth daily., Disp: , Rfl:    gabapentin  (NEURONTIN ) 300 MG capsule, Take 300 mg by mouth daily as needed (pain)., Disp: , Rfl:    montelukast  (SINGULAIR ) 10 MG tablet, Take 10 mg by mouth daily., Disp: , Rfl:    naproxen (NAPROSYN) 500 MG tablet, Take 500 mg by mouth 2 (two) times daily., Disp: , Rfl:    tiotropium (SPIRIVA ) 18 MCG inhalation capsule, Place 18 mcg into inhaler and inhale daily., Disp: , Rfl:    vitamin B-12 (CYANOCOBALAMIN ) 1000 MCG tablet, Take 1,000 mcg by mouth daily., Disp: ,  Rfl:    aspirin  EC 81 MG EC tablet, Take 1 tablet (81 mg total) by mouth daily at 6 (six) AM. Swallow whole. (Patient not taking: Reported on 04/27/2024), Disp: 90 tablet, Rfl: 3   atorvastatin  (LIPITOR) 10 MG tablet, Take 1 tablet (10 mg total) by mouth daily. (Patient not taking: Reported on 04/27/2024), Disp: 90 tablet, Rfl: 3   clopidogrel  (PLAVIX ) 75 MG tablet, Take 1 tablet (75 mg total) by mouth daily at 6 (six) AM. (Patient not taking: Reported on 04/27/2024), Disp: 90 tablet, Rfl: 3   Omega-3 Fatty Acids (FISH OIL) 1000 MG CAPS, Take 1,000 mg by mouth daily. (Patient not taking: Reported on 07/29/2023), Disp: , Rfl:    omeprazole  (PRILOSEC  OTC) 20 MG tablet, Take 20 mg by mouth daily. (Patient not taking: Reported on 07/29/2023), Disp: , Rfl:    oxyCODONE -acetaminophen  (PERCOCET/ROXICET) 5-325 MG tablet, Take 1 tablet by mouth every 6 (six) hours as needed for moderate pain. (Patient not taking: Reported on 07/29/2023), Disp: 15 tablet, Rfl: 0   SPIRIVA  HANDIHALER 18 MCG CAPS, Irrigate with 1 capsule as directed daily. (Patient not taking: Reported on 04/27/2024), Disp: , Rfl:  No  current facility-administered medications for this visit.  Facility-Administered Medications Ordered in Other Visits:    sodium chloride  flush (NS) 0.9 % injection 10 mL, 10 mL, Intravenous, PRN, Finnegan, Timothy J, MD, 10 mL at 04/23/20 0850  Physical exam:  Vitals:   04/27/24 1353 04/27/24 1356  BP: (!) 143/85 (!) 148/91  Pulse: 72   Resp: 19   Temp: 98.3 F (36.8 C)   TempSrc: Tympanic   SpO2: 99%   Weight: 151 lb 3.2 oz (68.6 kg)   Height: 5' 9 (1.753 m)    Physical Exam Cardiovascular:     Rate and Rhythm: Normal rate and regular rhythm.     Heart sounds: Normal heart sounds.  Pulmonary:     Effort: Pulmonary effort is normal.     Breath sounds: Normal breath sounds.  Skin:    General: Skin is warm and dry.  Neurological:     Mental Status: He is alert and oriented to person, place, and time.      I have personally reviewed labs listed below:    Latest Ref Rng & Units 04/27/2024    1:39 PM  CMP  Glucose 70 - 99 mg/dL 897   BUN 8 - 23 mg/dL 16   Creatinine 9.38 - 1.24 mg/dL 9.13   Sodium 864 - 854 mmol/L 130   Potassium 3.5 - 5.1 mmol/L 4.6   Chloride 98 - 111 mmol/L 97   CO2 22 - 32 mmol/L 24   Calcium  8.9 - 10.3 mg/dL 9.0   Total Protein 6.5 - 8.1 g/dL 7.2   Total Bilirubin 0.0 - 1.2 mg/dL 0.9   Alkaline Phos 38 - 126 U/L 66   AST 15 - 41 U/L 58   ALT 0 - 44 U/L 57       Latest Ref Rng & Units 04/27/2024    1:39 PM  CBC  WBC 4.0 - 10.5 K/uL 6.8   Hemoglobin 13.0 - 17.0 g/dL 84.0   Hematocrit 60.9 - 52.0 % 43.4   Platelets 150 - 400 K/uL 247       Assessment and plan- Patient is a 68 y.o. male here for a routine follow-up of hereditary hemochromatosis  Assessment and Plan    Hereditary hemochromatosis with iron overload Hereditary hemochromatosis with elevated ferritin at 534 when last checked  in May 2025.  Patient has not had phlebotomy since then and will need to resume it at this time.SABRA No cirrhosis on recent MRI.  In the absence of  cirrhosis patient does not need HCC - Performed phlebotomy today.  Goal ferritin is to bring it down to less than 50 and following that maintain ferritin less than 100. - Schedule weekly phlebotomies. - Monitor ferritin levels.      Phlebotomy today.  H&H and phlebotomy every week.  Ferritin levels to be checked every 3 weeks.  See me in 4 months with CBC with differential ferritin and CMP for possible phlebotomy   Visit Diagnosis 1. Hereditary hemochromatosis      Dr. Annah Skene, MD, MPH [facility] [phone] 04/27/2024 3:34 PM

## 2024-04-27 NOTE — Progress Notes (Signed)
 Patient states he's been doing good with no new or acute concerns at this time.

## 2024-05-04 ENCOUNTER — Inpatient Hospital Stay

## 2024-05-04 DIAGNOSIS — R5383 Other fatigue: Secondary | ICD-10-CM | POA: Diagnosis not present

## 2024-05-04 DIAGNOSIS — F17210 Nicotine dependence, cigarettes, uncomplicated: Secondary | ICD-10-CM | POA: Diagnosis not present

## 2024-05-04 DIAGNOSIS — F1290 Cannabis use, unspecified, uncomplicated: Secondary | ICD-10-CM | POA: Diagnosis not present

## 2024-05-04 DIAGNOSIS — R7401 Elevation of levels of liver transaminase levels: Secondary | ICD-10-CM | POA: Diagnosis not present

## 2024-05-04 LAB — HEMOGLOBIN AND HEMATOCRIT, BLOOD
HCT: 42.8 % (ref 39.0–52.0)
Hemoglobin: 15.3 g/dL (ref 13.0–17.0)

## 2024-05-04 NOTE — Progress Notes (Signed)
 Ronnie Ingram presents today for phlebotomy per MD orders. Phlebotomy procedure started at 1422 and ended at 1430. removed. Patient tolerated procedure well. IV needle removed intact.

## 2024-05-04 NOTE — Patient Instructions (Signed)

## 2024-05-11 ENCOUNTER — Inpatient Hospital Stay

## 2024-05-11 DIAGNOSIS — F1290 Cannabis use, unspecified, uncomplicated: Secondary | ICD-10-CM | POA: Diagnosis not present

## 2024-05-11 DIAGNOSIS — R7401 Elevation of levels of liver transaminase levels: Secondary | ICD-10-CM | POA: Diagnosis not present

## 2024-05-11 DIAGNOSIS — F17210 Nicotine dependence, cigarettes, uncomplicated: Secondary | ICD-10-CM | POA: Diagnosis not present

## 2024-05-11 DIAGNOSIS — R5383 Other fatigue: Secondary | ICD-10-CM | POA: Diagnosis not present

## 2024-05-11 LAB — HEMOGLOBIN AND HEMATOCRIT, BLOOD
HCT: 39.6 % (ref 39.0–52.0)
Hemoglobin: 14.3 g/dL (ref 13.0–17.0)

## 2024-05-11 NOTE — Patient Instructions (Signed)

## 2024-05-11 NOTE — Progress Notes (Signed)
 Ronnie Ingram presents today for phlebotomy per MD orders. Phlebotomy procedure started at 1410 and ended at 1421. 300 mls removed. Patient tolerated procedure well. IV needle removed intact.

## 2024-05-17 ENCOUNTER — Inpatient Hospital Stay

## 2024-05-17 DIAGNOSIS — R7401 Elevation of levels of liver transaminase levels: Secondary | ICD-10-CM | POA: Diagnosis not present

## 2024-05-17 LAB — HEMOGLOBIN AND HEMATOCRIT, BLOOD
HCT: 37.6 % — ABNORMAL LOW (ref 39.0–52.0)
Hemoglobin: 13.3 g/dL (ref 13.0–17.0)

## 2024-05-17 LAB — FERRITIN: Ferritin: 791 ng/mL — ABNORMAL HIGH (ref 24–336)

## 2024-05-17 NOTE — Patient Instructions (Signed)

## 2024-05-17 NOTE — Progress Notes (Signed)
 Ronnie Ingram presents today for phlebotomy per MD orders. Phlebotomy procedure started at 1425 and ended at 1440. removed. Patient tolerated procedure well. IV needle removed intact.

## 2024-05-25 ENCOUNTER — Inpatient Hospital Stay: Attending: Oncology

## 2024-05-25 ENCOUNTER — Inpatient Hospital Stay

## 2024-05-25 DIAGNOSIS — Z801 Family history of malignant neoplasm of trachea, bronchus and lung: Secondary | ICD-10-CM | POA: Insufficient documentation

## 2024-05-25 DIAGNOSIS — R5383 Other fatigue: Secondary | ICD-10-CM | POA: Insufficient documentation

## 2024-05-25 DIAGNOSIS — F1290 Cannabis use, unspecified, uncomplicated: Secondary | ICD-10-CM | POA: Insufficient documentation

## 2024-05-25 DIAGNOSIS — F17210 Nicotine dependence, cigarettes, uncomplicated: Secondary | ICD-10-CM | POA: Insufficient documentation

## 2024-05-25 DIAGNOSIS — R7401 Elevation of levels of liver transaminase levels: Secondary | ICD-10-CM | POA: Diagnosis not present

## 2024-05-25 LAB — HEMOGLOBIN AND HEMATOCRIT, BLOOD
HCT: 36.9 % — ABNORMAL LOW (ref 39.0–52.0)
Hemoglobin: 13.3 g/dL (ref 13.0–17.0)

## 2024-05-25 NOTE — Progress Notes (Signed)
 Ronnie Ingram presents today for phlebotomy per MD orders. Phlebotomy procedure started at 1420 and ended at 1436. 300 ml removed. Patient tolerated procedure well. IV needle removed intact.

## 2024-05-25 NOTE — Patient Instructions (Signed)

## 2024-05-31 ENCOUNTER — Other Ambulatory Visit: Payer: Self-pay

## 2024-06-01 ENCOUNTER — Inpatient Hospital Stay

## 2024-06-01 LAB — HEMOGLOBIN AND HEMATOCRIT (CANCER CENTER ONLY)
HCT: 41.9 % (ref 39.0–52.0)
Hemoglobin: 14.4 g/dL (ref 13.0–17.0)

## 2024-06-01 NOTE — Patient Instructions (Signed)

## 2024-06-01 NOTE — Progress Notes (Signed)
 Ronnie Ingram presents today for phlebotomy per MD orders. Phlebotomy procedure started at 1440 and ended at 1448.  removed. Patient tolerated procedure well. IV needle removed intact.

## 2024-06-08 ENCOUNTER — Inpatient Hospital Stay

## 2024-06-08 LAB — HEMOGLOBIN AND HEMATOCRIT (CANCER CENTER ONLY)
HCT: 42.9 % (ref 39.0–52.0)
Hemoglobin: 14.9 g/dL (ref 13.0–17.0)

## 2024-06-08 LAB — FERRITIN: Ferritin: 909 ng/mL — ABNORMAL HIGH (ref 24–336)

## 2024-06-08 NOTE — Patient Instructions (Signed)

## 2024-06-08 NOTE — Progress Notes (Signed)
 Ronnie Ingram presents today for phlebotomy per MD orders. Phlebotomy procedure started at 1430 and ended at 1439. 300 mls removed. Patient tolerated procedure well. IV needle removed intact.

## 2024-06-14 ENCOUNTER — Inpatient Hospital Stay

## 2024-06-21 ENCOUNTER — Inpatient Hospital Stay

## 2024-06-21 LAB — HEMOGLOBIN AND HEMATOCRIT (CANCER CENTER ONLY)
HCT: 42.7 % (ref 39.0–52.0)
Hemoglobin: 14.9 g/dL (ref 13.0–17.0)

## 2024-06-21 NOTE — Patient Instructions (Signed)

## 2024-06-21 NOTE — Progress Notes (Signed)
 Ronnie Ingram presents today for phlebotomy per MD orders. Phlebotomy procedure started at 1430 and ended at 1437. 300 mls removed. Patient tolerated procedure well. IV needle removed intact.

## 2024-06-29 ENCOUNTER — Inpatient Hospital Stay

## 2024-06-29 ENCOUNTER — Inpatient Hospital Stay: Attending: Oncology

## 2024-06-29 LAB — FERRITIN: Ferritin: 827 ng/mL — ABNORMAL HIGH (ref 24–336)

## 2024-06-29 LAB — HEMOGLOBIN AND HEMATOCRIT (CANCER CENTER ONLY)
HCT: 44.2 % (ref 39.0–52.0)
Hemoglobin: 15.4 g/dL (ref 13.0–17.0)

## 2024-06-29 NOTE — Progress Notes (Signed)
 Ronnie Ingram presents today for phlebotomy per MD orders. Phlebotomy procedure started at 1435 and ended at 1446. 300 mls removed. Patient tolerated procedure well. IV needle removed intact.

## 2024-06-29 NOTE — Patient Instructions (Signed)

## 2024-07-06 ENCOUNTER — Inpatient Hospital Stay

## 2024-07-06 LAB — HEMOGLOBIN AND HEMATOCRIT (CANCER CENTER ONLY)
HCT: 45.4 % (ref 39.0–52.0)
Hemoglobin: 15.7 g/dL (ref 13.0–17.0)

## 2024-07-06 NOTE — Patient Instructions (Signed)

## 2024-07-06 NOTE — Progress Notes (Signed)
 Ronnie Ingram presents today for phlebotomy per MD orders. Phlebotomy procedure started at 1425 and ended at 1435. 300 mls removed. Patient tolerated procedure well. IV needle removed intact.

## 2024-07-13 ENCOUNTER — Inpatient Hospital Stay

## 2024-07-13 LAB — HEMOGLOBIN AND HEMATOCRIT (CANCER CENTER ONLY)
HCT: 41.4 % (ref 39.0–52.0)
Hemoglobin: 14.3 g/dL (ref 13.0–17.0)

## 2024-07-13 NOTE — Progress Notes (Signed)
 Ronnie Ingram presents today for phlebotomy per MD orders. Phlebotomy procedure started at 1425 and ended at 1434. 300 mls removed. Patient tolerated procedure well. IV needle removed intact.

## 2024-07-13 NOTE — Patient Instructions (Signed)

## 2024-07-20 ENCOUNTER — Inpatient Hospital Stay

## 2024-07-20 LAB — HEMOGLOBIN AND HEMATOCRIT (CANCER CENTER ONLY)
HCT: 44.5 % (ref 39.0–52.0)
Hemoglobin: 15.4 g/dL (ref 13.0–17.0)

## 2024-07-20 LAB — FERRITIN: Ferritin: 650 ng/mL — ABNORMAL HIGH (ref 24–336)

## 2024-07-20 NOTE — Progress Notes (Signed)
 Ronnie Ingram presents today for phlebotomy per MD orders. Phlebotomy procedure started at 1440 and ended at 1449. 300 mls  removed. Patient tolerated procedure well. IV needle removed intact.

## 2024-07-20 NOTE — Patient Instructions (Signed)

## 2024-07-27 ENCOUNTER — Inpatient Hospital Stay: Attending: Oncology

## 2024-07-27 ENCOUNTER — Inpatient Hospital Stay

## 2024-07-27 LAB — HEMOGLOBIN AND HEMATOCRIT (CANCER CENTER ONLY)
HCT: 43.3 % (ref 39.0–52.0)
Hemoglobin: 14.9 g/dL (ref 13.0–17.0)

## 2024-07-27 NOTE — Patient Instructions (Signed)

## 2024-07-27 NOTE — Progress Notes (Signed)
 Ronnie Ingram presents today for phlebotomy per MD orders. Phlebotomy procedure started at 1425 and ended at 1435. 300 mls removed. Patient tolerated procedure well. IV needle removed intact.

## 2024-08-03 ENCOUNTER — Inpatient Hospital Stay

## 2024-08-10 ENCOUNTER — Inpatient Hospital Stay

## 2024-08-17 ENCOUNTER — Inpatient Hospital Stay: Admitting: Nurse Practitioner

## 2024-08-17 ENCOUNTER — Inpatient Hospital Stay
